# Patient Record
Sex: Male | Born: 1935 | Race: White | Hispanic: No | Marital: Married | Smoking: Former smoker
Health system: Southern US, Community
[De-identification: ages and names within clinical notes are randomized; demographics above are authoritative.]

## PROBLEM LIST (undated history)

## (undated) DIAGNOSIS — I7409 Other arterial embolism and thrombosis of abdominal aorta: Secondary | ICD-10-CM

## (undated) DIAGNOSIS — I10 Essential (primary) hypertension: Secondary | ICD-10-CM

## (undated) DIAGNOSIS — I2510 Atherosclerotic heart disease of native coronary artery without angina pectoris: Secondary | ICD-10-CM

## (undated) DIAGNOSIS — I739 Peripheral vascular disease, unspecified: Secondary | ICD-10-CM

## (undated) DIAGNOSIS — Z951 Presence of aortocoronary bypass graft: Secondary | ICD-10-CM

## (undated) DIAGNOSIS — Z720 Tobacco use: Secondary | ICD-10-CM

## (undated) DIAGNOSIS — I771 Stricture of artery: Secondary | ICD-10-CM

## (undated) DIAGNOSIS — G453 Amaurosis fugax: Secondary | ICD-10-CM

## (undated) DIAGNOSIS — I4510 Unspecified right bundle-branch block: Secondary | ICD-10-CM

## (undated) DIAGNOSIS — E785 Hyperlipidemia, unspecified: Secondary | ICD-10-CM

## (undated) DIAGNOSIS — Z9889 Other specified postprocedural states: Secondary | ICD-10-CM

## (undated) DIAGNOSIS — Z950 Presence of cardiac pacemaker: Secondary | ICD-10-CM

## (undated) DIAGNOSIS — I442 Atrioventricular block, complete: Secondary | ICD-10-CM

## (undated) DIAGNOSIS — I350 Nonrheumatic aortic (valve) stenosis: Secondary | ICD-10-CM

## (undated) DIAGNOSIS — I219 Acute myocardial infarction, unspecified: Secondary | ICD-10-CM

## (undated) DIAGNOSIS — J449 Chronic obstructive pulmonary disease, unspecified: Secondary | ICD-10-CM

## (undated) DIAGNOSIS — I679 Cerebrovascular disease, unspecified: Secondary | ICD-10-CM

## (undated) HISTORY — DX: Tobacco use: Z72.0

## (undated) HISTORY — PX: CAROTID ENDARTERECTOMY: SUR193

## (undated) HISTORY — PX: EYE SURGERY: SHX253

## (undated) HISTORY — DX: Atherosclerotic heart disease of native coronary artery without angina pectoris: I25.10

## (undated) HISTORY — DX: Unspecified right bundle-branch block: I45.10

## (undated) HISTORY — DX: Acute myocardial infarction, unspecified: I21.9

## (undated) HISTORY — DX: Cerebrovascular disease, unspecified: I67.9

## (undated) HISTORY — DX: Other specified postprocedural states: Z98.890

## (undated) HISTORY — DX: Stricture of artery: I77.1

## (undated) HISTORY — DX: Hyperlipidemia, unspecified: E78.5

## (undated) HISTORY — DX: Nonrheumatic aortic (valve) stenosis: I35.0

## (undated) HISTORY — DX: Atrioventricular block, complete: I44.2

## (undated) HISTORY — DX: Essential (primary) hypertension: I10

## (undated) HISTORY — PX: CORONARY ARTERY BYPASS GRAFT: SHX141

## (undated) HISTORY — DX: Peripheral vascular disease, unspecified: I73.9

## (undated) HISTORY — DX: Presence of aortocoronary bypass graft: Z95.1

## (undated) HISTORY — DX: Amaurosis fugax: G45.3

## (undated) HISTORY — DX: Other arterial embolism and thrombosis of abdominal aorta: I74.09

## (undated) HISTORY — PX: CARDIAC VALVE REPLACEMENT: SHX585

---

## 1994-03-22 DIAGNOSIS — I219 Acute myocardial infarction, unspecified: Secondary | ICD-10-CM

## 1994-03-22 HISTORY — DX: Acute myocardial infarction, unspecified: I21.9

## 1994-03-28 DIAGNOSIS — Z951 Presence of aortocoronary bypass graft: Secondary | ICD-10-CM

## 1994-03-28 HISTORY — DX: Presence of aortocoronary bypass graft: Z95.1

## 1996-10-11 DIAGNOSIS — G453 Amaurosis fugax: Secondary | ICD-10-CM

## 1996-10-11 HISTORY — DX: Amaurosis fugax: G45.3

## 1996-10-28 DIAGNOSIS — Z9889 Other specified postprocedural states: Secondary | ICD-10-CM | POA: Insufficient documentation

## 1996-10-28 HISTORY — DX: Other specified postprocedural states: Z98.890

## 2001-01-26 ENCOUNTER — Encounter: Payer: Self-pay | Admitting: Family Medicine

## 2001-01-26 ENCOUNTER — Ambulatory Visit (HOSPITAL_COMMUNITY): Admission: RE | Admit: 2001-01-26 | Discharge: 2001-01-26 | Payer: Self-pay | Admitting: Family Medicine

## 2003-05-15 ENCOUNTER — Encounter: Admission: RE | Admit: 2003-05-15 | Discharge: 2003-05-15 | Payer: Self-pay | Admitting: Internal Medicine

## 2005-12-17 ENCOUNTER — Ambulatory Visit: Payer: Self-pay | Admitting: Internal Medicine

## 2009-06-13 ENCOUNTER — Encounter: Payer: Self-pay | Admitting: Internal Medicine

## 2009-07-10 ENCOUNTER — Encounter: Payer: Self-pay | Admitting: Internal Medicine

## 2009-07-10 HISTORY — PX: CARDIOVASCULAR STRESS TEST: SHX262

## 2009-07-19 ENCOUNTER — Encounter: Payer: Self-pay | Admitting: Internal Medicine

## 2009-12-11 ENCOUNTER — Encounter: Payer: Self-pay | Admitting: Internal Medicine

## 2010-01-10 ENCOUNTER — Encounter: Payer: Self-pay | Admitting: Internal Medicine

## 2010-03-14 NOTE — Letter (Signed)
Summary: [facility]  [facility]   Imported By: Lanelle Bal 07/28/2009 11:27:55  _____________________________________________________________________  External Attachment:    Type:   Image     Comment:   External Document

## 2010-03-14 NOTE — Letter (Signed)
Summary: [facility]  [facility]   Imported By: Lanelle Bal 12/27/2009 13:24:55  _____________________________________________________________________  External Attachment:    Type:   Image     Comment:   External Document

## 2010-03-14 NOTE — Letter (Signed)
Summary: [facility]  [facility]   Imported By: Lanelle Bal 06/25/2009 13:23:40  _____________________________________________________________________  External Attachment:    Type:   Image     Comment:   External Document

## 2010-10-22 ENCOUNTER — Other Ambulatory Visit: Payer: Self-pay

## 2010-10-22 MED ORDER — ZOSTER VACCINE LIVE 19400 UNT/0.65ML ~~LOC~~ SOLR: 0.6500 mL | Freq: Once | SUBCUTANEOUS | Status: DC

## 2010-10-22 NOTE — Telephone Encounter (Signed)
Rx sent to pharmacy per Patient's wife request

## 2011-04-30 DIAGNOSIS — H2510 Age-related nuclear cataract, unspecified eye: Secondary | ICD-10-CM | POA: Diagnosis not present

## 2011-04-30 DIAGNOSIS — H25019 Cortical age-related cataract, unspecified eye: Secondary | ICD-10-CM | POA: Diagnosis not present

## 2011-05-27 DIAGNOSIS — E782 Mixed hyperlipidemia: Secondary | ICD-10-CM | POA: Diagnosis not present

## 2011-05-27 DIAGNOSIS — Z79899 Other long term (current) drug therapy: Secondary | ICD-10-CM | POA: Diagnosis not present

## 2011-05-27 DIAGNOSIS — I2510 Atherosclerotic heart disease of native coronary artery without angina pectoris: Secondary | ICD-10-CM | POA: Diagnosis not present

## 2011-06-02 DIAGNOSIS — I4510 Unspecified right bundle-branch block: Secondary | ICD-10-CM | POA: Diagnosis not present

## 2011-06-02 DIAGNOSIS — I359 Nonrheumatic aortic valve disorder, unspecified: Secondary | ICD-10-CM | POA: Diagnosis not present

## 2011-06-02 DIAGNOSIS — Z951 Presence of aortocoronary bypass graft: Secondary | ICD-10-CM | POA: Diagnosis not present

## 2011-06-02 DIAGNOSIS — I2510 Atherosclerotic heart disease of native coronary artery without angina pectoris: Secondary | ICD-10-CM | POA: Diagnosis not present

## 2011-06-10 DIAGNOSIS — I2510 Atherosclerotic heart disease of native coronary artery without angina pectoris: Secondary | ICD-10-CM | POA: Diagnosis not present

## 2011-06-10 DIAGNOSIS — I359 Nonrheumatic aortic valve disorder, unspecified: Secondary | ICD-10-CM | POA: Diagnosis not present

## 2011-06-19 ENCOUNTER — Other Ambulatory Visit: Payer: Self-pay | Admitting: Cardiovascular Disease

## 2011-06-19 DIAGNOSIS — I359 Nonrheumatic aortic valve disorder, unspecified: Secondary | ICD-10-CM | POA: Diagnosis not present

## 2011-06-19 DIAGNOSIS — I2510 Atherosclerotic heart disease of native coronary artery without angina pectoris: Secondary | ICD-10-CM | POA: Diagnosis not present

## 2011-06-19 DIAGNOSIS — R5381 Other malaise: Secondary | ICD-10-CM | POA: Diagnosis not present

## 2011-06-19 DIAGNOSIS — Z951 Presence of aortocoronary bypass graft: Secondary | ICD-10-CM | POA: Diagnosis not present

## 2011-06-19 DIAGNOSIS — D689 Coagulation defect, unspecified: Secondary | ICD-10-CM | POA: Diagnosis not present

## 2011-06-19 DIAGNOSIS — Z79899 Other long term (current) drug therapy: Secondary | ICD-10-CM | POA: Diagnosis not present

## 2011-06-19 DIAGNOSIS — R6889 Other general symptoms and signs: Secondary | ICD-10-CM | POA: Diagnosis not present

## 2011-06-19 DIAGNOSIS — Z01818 Encounter for other preprocedural examination: Secondary | ICD-10-CM | POA: Diagnosis not present

## 2011-06-23 ENCOUNTER — Encounter (HOSPITAL_COMMUNITY): Payer: Self-pay

## 2011-06-24 ENCOUNTER — Ambulatory Visit (HOSPITAL_COMMUNITY): Payer: Medicare Other

## 2011-06-24 ENCOUNTER — Ambulatory Visit (HOSPITAL_COMMUNITY)
Admission: RE | Admit: 2011-06-24 | Discharge: 2011-06-24 | Disposition: A | Discharge status: Home / Self Care | Payer: Medicare Other | Source: Ambulatory Visit | Attending: Cardiovascular Disease | Admitting: Cardiovascular Disease

## 2011-06-24 ENCOUNTER — Encounter (HOSPITAL_COMMUNITY)
Admission: RE | Disposition: A | Discharge status: Home / Self Care | Payer: Self-pay | Source: Ambulatory Visit | Attending: Cardiovascular Disease

## 2011-06-24 DIAGNOSIS — I2510 Atherosclerotic heart disease of native coronary artery without angina pectoris: Secondary | ICD-10-CM | POA: Insufficient documentation

## 2011-06-24 DIAGNOSIS — Z951 Presence of aortocoronary bypass graft: Secondary | ICD-10-CM | POA: Diagnosis not present

## 2011-06-24 DIAGNOSIS — I2582 Chronic total occlusion of coronary artery: Secondary | ICD-10-CM | POA: Diagnosis not present

## 2011-06-24 DIAGNOSIS — I359 Nonrheumatic aortic valve disorder, unspecified: Secondary | ICD-10-CM | POA: Diagnosis not present

## 2011-06-24 DIAGNOSIS — R918 Other nonspecific abnormal finding of lung field: Secondary | ICD-10-CM | POA: Diagnosis not present

## 2011-06-24 DIAGNOSIS — I25810 Atherosclerosis of coronary artery bypass graft(s) without angina pectoris: Secondary | ICD-10-CM | POA: Diagnosis not present

## 2011-06-24 HISTORY — PX: LEFT HEART CATHETERIZATION WITH CORONARY/GRAFT ANGIOGRAM: SHX5450

## 2011-06-24 HISTORY — PX: CARDIAC CATHETERIZATION: SHX172

## 2011-06-24 LAB — POCT I-STAT 3, ART BLOOD GAS (G3+)
O2 Saturation: 93 %
pCO2 arterial: 41.7 mmHg (ref 35.0–45.0)
pH, Arterial: 7.353 (ref 7.350–7.450)
pO2, Arterial: 69 mmHg — ABNORMAL LOW (ref 80.0–100.0)

## 2011-06-24 LAB — POCT I-STAT 3, VENOUS BLOOD GAS (G3P V)
Bicarbonate: 25.4 mEq/L — ABNORMAL HIGH (ref 20.0–24.0)
Bicarbonate: 26.1 mEq/L — ABNORMAL HIGH (ref 20.0–24.0)
O2 Saturation: 63 %
TCO2: 28 mmol/L (ref 0–100)
pCO2, Ven: 48.5 mmHg (ref 45.0–50.0)
pH, Ven: 7.34 — ABNORMAL HIGH (ref 7.250–7.300)
pO2, Ven: 35 mmHg (ref 30.0–45.0)

## 2011-06-24 SURGERY — LEFT HEART CATHETERIZATION WITH CORONARY/GRAFT ANGIOGRAM
Anesthesia: LOCAL

## 2011-06-24 MED ORDER — SODIUM CHLORIDE 0.9 % IJ SOLN: 3.0000 mL | Freq: Two times a day (BID) | INTRAMUSCULAR | Status: DC

## 2011-06-24 MED ORDER — ONDANSETRON HCL 4 MG/2ML IJ SOLN: 4.0000 mg | Freq: Four times a day (QID) | INTRAMUSCULAR | Status: DC | PRN

## 2011-06-24 MED ORDER — SODIUM CHLORIDE 0.9 % IV SOLN: 250.0000 mL | INTRAVENOUS | Status: DC | PRN

## 2011-06-24 MED ORDER — ACETAMINOPHEN 325 MG PO TABS: 650.0000 mg | ORAL_TABLET | ORAL | Status: DC | PRN

## 2011-06-24 MED ORDER — HEPARIN (PORCINE) IN NACL 2-0.9 UNIT/ML-% IJ SOLN
INTRAMUSCULAR | Status: AC
  Filled 2011-06-24: qty 2000

## 2011-06-24 MED ORDER — SODIUM CHLORIDE 0.9 % IJ SOLN: 3.0000 mL | INTRAMUSCULAR | Status: DC | PRN

## 2011-06-24 MED ORDER — SODIUM CHLORIDE 0.9 % IV SOLN
INTRAVENOUS | Status: DC
  Administered 2011-06-24: 1000 mL via INTRAVENOUS

## 2011-06-24 MED ORDER — SODIUM CHLORIDE 0.9 % IV SOLN: 1.0000 mL/kg/h | INTRAVENOUS | Status: DC

## 2011-06-24 MED ORDER — ASPIRIN 81 MG PO CHEW
324.0000 mg | CHEWABLE_TABLET | ORAL | Status: AC
  Administered 2011-06-24: 324 mg via ORAL
  Filled 2011-06-24: qty 4

## 2011-06-24 MED ORDER — NITROGLYCERIN 0.2 MG/ML ON CALL CATH LAB
INTRAVENOUS | Status: AC
  Filled 2011-06-24: qty 1

## 2011-06-24 MED ORDER — LIDOCAINE HCL (PF) 1 % IJ SOLN
INTRAMUSCULAR | Status: AC
  Filled 2011-06-24: qty 30

## 2011-06-24 NOTE — Discharge Instructions (Signed)
Appt with Dr. Cornelius Moras on Monday 5/20 at 9:00 am   Groin Site Care Refer to this sheet in the next few weeks. These instructions provide you with information on caring for yourself after your procedure. Your caregiver may also give you more specific instructions. Your treatment has been planned according to current medical practices, but problems sometimes occur. Call your caregiver if you have any problems or questions after your procedure. HOME CARE INSTRUCTIONS  You may shower 24 hours after the procedure. Remove the bandage (dressing) and gently wash the site with plain soap and water. Gently pat the site dry.   Do not apply powder or lotion to the site.   Do not sit in a bathtub, swimming pool, or whirlpool for 5 to 7 days.   No bending, squatting, or lifting anything over 10 pounds (4.5 kg) as directed by your caregiver.   Inspect the site at least twice daily.   Do not drive home if you are discharged the same day of the procedure. Have someone else drive you.   You may drive 24 hours after the procedure unless otherwise instructed by your caregiver.  What to expect:  Any bruising will usually fade within 1 to 2 weeks.   Blood that collects in the tissue (hematoma) may be painful to the touch. It should usually decrease in size and tenderness within 1 to 2 weeks.  SEEK IMMEDIATE MEDICAL CARE IF:  You have unusual pain at the groin site or down the affected leg.   You have redness, warmth, swelling, or pain at the groin site.   You have drainage (other than a small amount of blood on the dressing).   You have chills.   You have a fever or persistent symptoms for more than 72 hours.   You have a fever and your symptoms suddenly get worse.   Your leg becomes pale, cool, tingly, or numb.   You have heavy bleeding from the site. Hold pressure on the site.  Document Released: 03/01/2010 Document Revised: 01/16/2011 Document Reviewed: 03/01/2010 [facility] Patient Information  2012 [place], [place].

## 2011-06-24 NOTE — CV Procedure (Signed)
Luis Townsend,Luis Townsend Male, 78 y.o., November 24, 1928  Location: [facility]   CARDIAC CATHETERIZATION REPORT   Procedures performed:  1. Right and left heart catheterization  2. Selective coronary angiography  3. Selective angiography of saphenous vein graft bypasses 4. Selective angiography of left subclavian artery ; subselective angiography of left internal mammary artery bypass to LAD 5.  Selective angiography of bilateral common iliac arteries   Reason for procedure:  Severe aortic stenosis Coronary artery disease status post previous coronary bypass surgery  Procedure performed by: Thurmon Fair, MD, [facility]  Complications: none   Estimated blood loss: less than 5 mL   History:  This is an 78 year old gentleman who is now roughly 18 years status post coronary bypass surgery who has also had carotid endarterectomy and is being evaluated for severe aortic stenosis. There is a noticeable decline in activity level despite the fact that he does not endorsed symptoms of exertional dyspnea angina or syncope.  Consent: The risks, benefits, and details of the procedure were explained to the patient. Risks including death, MI, stroke, bleeding, limb ischemia, renal failure and allergy were described and accepted by the patient. Informed written consent was obtained prior to proceeding.  Technique: The patient was brought to the cardiac catheterization laboratory in the fasting state. He was prepped and draped in the usual sterile fashion. Local anesthesia with 1% lidocaine was administered to the right groin area. Using the modified Seldinger technique a 5 French right common femoral artery sheath was introduced without difficulty. Although the treated guidewire passed through the iliac artery without difficulty the JL4 catheter required some manipulation due to the extensive ossification and luminal irregularities in the right common iliac artery. Under fluoroscopic guidance, using 5 [place] JL4, JR   selective cannulation of the left coronary artery, right coronary artery and left ventricle were respectively performed. A straight tipped guidewires was used to cross the aortic valve (with moderate difficulty). The JR catheter was used to record left ventricular pressure and a pullback to the aorta. The same JR catheter was used to perform selective angiography of the saphenous vein graft bypasses and was then used to cannulate the left subclavian artery. The JR catheter would not cross a severe stenosis in the proximal left subclavian artery just upstream of the left vertebral and left intramammary artery ostia. A Wholey wire was successfully passed across the stenosis but the catheter would not follow.   Several angiograms of the native coronary arteries and a saphenous vein graft bypasses, in a variety of projections, were recorded. Subselective angiography of the LIMA bypass was also performed.   Subsequently right heart catheterization was performed using a 7 [place] balloontipped pulmonary artery catheter. This was passed under fluoroscopic guidance to the right heart chambers to the pulmonary artery and wedge position. The cath was also used to obtain samples of blood for oxygen saturation from the main pulmonary artery and right atrium all a simultaneous sample of blood for systemic oxygen saturation was obtained via the femoral artery sheath.  At the end of the procedure, all catheters were removed. After the procedure, hemostasis will be achieved with manual pressure.  Contrast used: 125 mL Omnipaque  Hemodynamic findings:  Aortic pressure 157/63 mm Hg (mean 96 mm Hg)  Left ventricle 203/9 mm Hg with end-diastolic pressure of 25 mm Hg due to a prominent "a kick"  Pulmonary artery wedge pressure A wave 17 V-wave 14 mean 12 mm Hg  Pulmonary artery 31/12 mm Hg ( mean 19  mm Hg)  Right ventricle 34/4 mm Hg with an end-diastolic pressure of 8 mm Hg  Right atrium A wave 11 V wave 8 mean 7  mm Hg  Cardiac output is 3.98 L per minute (cardiac index 2.19 L per minute per meter sq) using the Fick equation  Dear valve mean gradient was 65 mmHg the peak to peak gradient was 83 mmHg, aortic valve area using the Gorlin equation was 0.54 cm (0.37cm/ m body surface area)   Angiographic Findings:  1. The left main coronary artery is heavily diseased and calcified with a 90% ostial eccentric stenosis and continues exclusively with the left circumflex coronary artery.  2. The left anterior descending artery is totally occluded at the ostium. The mid and distal potion of the LAD fill from the LIMA bypass. The LAD is a large vessel that reaches the apex and generates one major diagonal branch. There is evidence of extensive and diffuse luminal irregularities and moderate calcification. No discrete hemodynamically meaningful stenoses are seen. 3. The left circumflex coronary artery is a large-size vessel non dominant vessel that reportedly used to generate three major oblique marginal arteries. Only the AV groove portion of the circumflex artery is patent and it has a 80% eccentric proximal to mid stenosis. It feeds two small and very distal posterolateral ventricular branches. 4. The right coronary artery is a medium-size dominant vessel that is totally occluded following the sinoatrial branch. 5. The saphenous vein bypass graft to the posterior descending artery is severely diseased in its distal third, with a maximum stenosis of 85-90%. The PDA is diffusely diseased and relatively small. 6. The saphenous vein bypass graft to the OM1-OM2-OM3 is patent but only one of the OM arteries is patent (probably OM2). Despite being diffusely diseased, this graft provides good flow. The OM1 is clearly occluded and heavily calcified. 7. The left internal mammary artery bypass to the LAD is healthy and widely patent, but is downstream of a severe (>85%) left subclavian artery stenosis (located roughly 1 cm  proximal to the left vertebral artery ostium), which was crossed with a guidewire, but could not be crossed with a catheter. 8. The left ventricle was not injected, but the aortic valve was crossed for pressure measurement. There is critical aortic valve stenosis by pullback. The valve is heavily calcified and has severely restricted motion by fluoroscopy.The left ventricular end-diastolic pressure is elevated, primarily due to a prominent "a kick".  9. The iliac arteries and the abdominal aorta are heavily calcified and rigid and there was mild difficulty advancing diagnostic catheters up the iliac artery. No severe stenoses are seen with the limited evaluation possible at the time of diagnostic cardiac cath. However, the vessels appear too diseased to allow consideration of TAVR or even IABP support.   IMPRESSIONS:  Critical aortic stenosis. Elevated LVEDP without increase in left atrial mean pressure or PA pressure at rest. Severe native and graft coronary artery disease. RECOMMENDATION:  Percutaneous angioplasty and stenting of the left subclavian artery. High risk replacement of the aortic valve with a biological prosthesis and redo CABG with preservation of the LIMA bypass and new SVG to PDA and OM. Reevaluate carotid arteries and pulmonary function prior to surgery.     Thurmon Fair, MD, [facility] [phone] office [phone] pager 06/24/2011 11:01 AM

## 2011-06-24 NOTE — H&P (Signed)
Date of Initial H&P: Dr. Susa Griffins, 06/18/11  History reviewed, patient examined, no change in status, stable for surgery. Hx of remote CABG now with echo and clinical findings of severe AS and questionable functional status. Risks and benefits of R&L heart cath reviewed and he wishes to proceed. Thurmon Fair, MD, [facility] [phone] office [phone] pager 06/24/2011 8:39 AM

## 2011-06-25 DIAGNOSIS — I739 Peripheral vascular disease, unspecified: Secondary | ICD-10-CM | POA: Diagnosis not present

## 2011-06-25 DIAGNOSIS — I359 Nonrheumatic aortic valve disorder, unspecified: Secondary | ICD-10-CM | POA: Diagnosis not present

## 2011-06-25 DIAGNOSIS — I2510 Atherosclerotic heart disease of native coronary artery without angina pectoris: Secondary | ICD-10-CM | POA: Diagnosis not present

## 2011-06-30 ENCOUNTER — Institutional Professional Consult (permissible substitution) (INDEPENDENT_AMBULATORY_CARE_PROVIDER_SITE_OTHER): Payer: Medicare Other | Admitting: Thoracic Surgery (Cardiothoracic Vascular Surgery)

## 2011-06-30 ENCOUNTER — Encounter: Payer: Self-pay | Admitting: Thoracic Surgery (Cardiothoracic Vascular Surgery)

## 2011-06-30 VITALS — BP 145/64 | HR 58 | Resp 16 | Ht 66.0 in | Wt 160.0 lb

## 2011-06-30 DIAGNOSIS — I771 Stricture of artery: Secondary | ICD-10-CM

## 2011-06-30 DIAGNOSIS — Z951 Presence of aortocoronary bypass graft: Secondary | ICD-10-CM | POA: Diagnosis not present

## 2011-06-30 DIAGNOSIS — I679 Cerebrovascular disease, unspecified: Secondary | ICD-10-CM | POA: Insufficient documentation

## 2011-06-30 DIAGNOSIS — I10 Essential (primary) hypertension: Secondary | ICD-10-CM | POA: Insufficient documentation

## 2011-06-30 DIAGNOSIS — I7409 Other arterial embolism and thrombosis of abdominal aorta: Secondary | ICD-10-CM

## 2011-06-30 DIAGNOSIS — I350 Nonrheumatic aortic (valve) stenosis: Secondary | ICD-10-CM

## 2011-06-30 DIAGNOSIS — I359 Nonrheumatic aortic valve disorder, unspecified: Secondary | ICD-10-CM

## 2011-06-30 DIAGNOSIS — I2510 Atherosclerotic heart disease of native coronary artery without angina pectoris: Secondary | ICD-10-CM | POA: Diagnosis not present

## 2011-06-30 HISTORY — DX: Other arterial embolism and thrombosis of abdominal aorta: I74.09

## 2011-06-30 HISTORY — DX: Cerebrovascular disease, unspecified: I67.9

## 2011-06-30 HISTORY — DX: Stricture of artery: I77.1

## 2011-06-30 HISTORY — DX: Nonrheumatic aortic (valve) stenosis: I35.0

## 2011-06-30 NOTE — Progress Notes (Signed)
[address]            [place] [address]          [phone]     CARDIOTHORACIC SURGERY CONSULTATION REPORT  Referring Provider is Susa Griffins, MD PCP is Marga Melnick, MD, MD  Chief Complaint  Patient presents with  . Coronary Artery Disease    eval and treat...cathed 06/24/11...echo 06/10/11  . Aortic Stenosis  . 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  The patient originally suffered an acute anterior wall myocardial infarction in February 1996. He underwent angioplasty followed by coronary artery bypass grafting x5 by Dr. Andrey Campanile. Patient has done extremely well from a cardiovascular standpoint since then. He developed a murmur on exam and was diagnosed with aortic stenosis several years ago. He has been followed carefully by Dr. Alanda Amass since then. The patient describes gradual progression of exertional fatigue with perhaps mild exertional shortness of breath. He is quite active physically for his age, but he has noted that he seems to be slowing down over the last several months or more. He has not had any chest pain chest tightness nor chest pressure either with activity or at rest. He does not get short of breath with normal physical activity. He has had no dizzy spells nor syncope. He denies PND, orthopnea, or lower extremity edema. Recent echocardiogram performed 06/10/2011 demonstrated significant progression of the severity of aortic stenosis. The peak velocity across the aortic valve measured 5.1 m/sec corresponding to peak transvalvular gradient estimated  70 mm mercury with valve area estimated between 0.59 and 0.62 cm. Left ventricular function reportedly remains preserved with ejection fraction estimated greater than equal to 55%.  He subsequently underwent elective left and right heart  catheterization on 06/24/2011. This confirmed the presence of severe aortic stenosis with Peak to peak and mean transvalvular gradients measured across the aortic valve of 83 and 65 mm mercury respectively. Aortic valve area was estimated 0.54 cm. Cardiac output and pulmonary artery pressures were essentially normal. Cardiac catheterization was notable for the presence of severe native three-vessel coronary artery disease with essentially complete occlusion of all major native coronary arteries. Previous left internal mammary artery graft was widely patent to the left anterior descending coronary artery. Vein graft to the circumflex system was patent although 2 of the 3 distal vessels in the circumflex system were chronically occluded. Vein graft to the right coronary artery was patent but severely diseased. The patient has been referred to consider possible treatment options.  Past Medical History  Diagnosis Date  . Myocardial infarction 03/22/1994    acute LAD occlusion  . Hypertension   . Hyperlipidemia   . S/P CABG x 5 03/28/1994    LIMA to LAD, sequential SVG to OM1-OM2-OM4, SVG to RCA, open vein harvest right leg - Dr Andrey Campanile  . Cerebrovascular disease 06/30/2011  . S/P carotid endarterectomy 10/28/1996    Dr Arbie Cookey  . Subclavian artery stenosis, left 06/30/2011  . Aortoiliac occlusive disease 06/30/2011  . Aortic stenosis 06/30/2011    severe  . Amaurosis fugax of left eye 10/11/1996  . Tobacco abuse     Past Surgical History  Procedure Date  . Carotid endarterectomy 10/28/96  DR. TODD EARLY    LEFT AND DACRON PATCH ANGIOPLASTY  .  Coronary artery bypass graft 03/28/94 DR. CHARLES WILSON    x5(LIMA to LAD,SVG to OM1-OM2-OM4, SVG to RCA    Family History  Problem Relation Age of Onset  . Stroke Mother     Social History History  Substance Use Topics  . Smoking status: Former Smoker    Quit date: 03/02/2011  . Smokeless tobacco: Never Used  . Alcohol Use: No    Current Outpatient  Prescriptions  Medication Sig Dispense Refill  . aspirin EC 81 MG tablet Take 81 mg by mouth daily.      . cholecalciferol (VITAMIN D) 1000 UNITS tablet Take 1,000 Units by mouth daily.      . fish oil-omega-3 fatty acids 1000 MG capsule Take 1 g by mouth daily.      . ramipril (ALTACE) 10 MG capsule Take 20 mg by mouth daily.       . rosuvastatin (CRESTOR) 20 MG tablet Take 40 mg by mouth every evening.       . zoster vaccine live, PF, (ZOSTAVAX) 16109 UNT/0.65ML injection Inject 19,400 Units into the skin once.  1 vial  0    No Known Allergies  Review of Systems:  General:  Decreased appetite, approx 10 lb weight loss, decreased energy   Respiratory:  + occasional cough, no wheezing, no hemoptysis, no pain with inspiration or cough, no shortness of breath  Cardiac:   no chest pain or tightness, + mild exertional SOB but only with significant exertion, no resting SOB, no PND, no orthopnea, no LE edema, no palpitations, no syncope  GI:   no difficulty swallowing, no hematochezia, no hematemesis, no melena, no constipation, no diarrhea   GU:   no dysuria, no urgency, no frequency   Musculoskeletal: no arthritis, no arthralgia   Vascular:  no pain suggestive of claudication   Neuro:   no symptoms suggestive of TIA's, no seizures, no headaches, no peripheral neuropathy   Endocrine:  Negative   HEENT:  no loose teeth or painful teeth,  no recent vision changes  Psych:   no anxiety, no depression    Physical Exam:   BP 145/64  Pulse 58  Resp 16  Ht 5\' 6"  (1.676 m)  Wt 160 lb (72.576 kg)  BMI 25.82 kg/m2  SpO2 96%  General:  well-appearing, looks younger than stated age  HEENT:  Unremarkable   Neck:   no JVD, + bilateral carotid bruits, old scar left neck, no adenopathy   Chest:   clear to auscultation, symmetrical breath sounds, no wheezes, no rhonchi, well-healed sternotomy scar with stable sternum  CV:   RRR, II/VI soft systolic murmur best RUSB  Abdomen:  soft, non-tender, no  masses   Extremities:  warm, well-perfused, pulses diminished in groin - not palpable at ankle, no LE edema, old scar right leg from just above the knee to ankle  Rectal/GU  Deferred  Neuro:   Grossly non-focal and symmetrical throughout  Skin:   Clean and dry, no rashes, no breakdown   Diagnostic Tests:  Report of 2-D echocardiogram performed 06/10/2011 [facility] is reviewed. By report left ventricular was normal in size with mild concentric left ventricular hypertrophy. Left ventricular systolic function was reportedly normal with ejection fraction greater than or equal to 55%. There was severe aortic stenosis with moderate calcification of the aortic valve leaflets and trace aortic regurgitation. The peak velocity across the aortic valve measured 514 cm/s corresponding to a mean gradient of 70 mm mercury and valve  area estimated between 0.5 9 and 0.62 cm. Aortic root diameter was estimated 3.0 cm with LV outflow tract diameter 2.0 cm. There was mild to moderate dilatation of the left atrium with mild mitral annular calcification and only trace mitral regurgitation. No other significant abnormalities were noted.    CARDIAC CATHETERIZATION REPORT   Procedures performed:  1. Right and left heart catheterization  2. Selective coronary angiography  3. Selective angiography of saphenous vein graft bypasses  4. Selective angiography of left subclavian artery ; subselective angiography of left internal mammary artery bypass to LAD  5. Selective angiography of bilateral common iliac arteries  Reason for procedure:  Severe aortic stenosis  Coronary artery disease status post previous coronary bypass surgery  Procedure performed by: Thurmon Fair, MD, [facility]  Complications: none  Estimated blood loss: less than 5 mL  History: This is an 78 year old gentleman who is now roughly 18 years status post coronary bypass surgery who has also had carotid endarterectomy and is  being evaluated for severe aortic stenosis. There is a noticeable decline in activity level despite the fact that he does not endorsed symptoms of exertional dyspnea angina or syncope.  Consent: The risks, benefits, and details of the procedure were explained to the patient. Risks including death, MI, stroke, bleeding, limb ischemia, renal failure and allergy were described and accepted by the patient. Informed written consent was obtained prior to proceeding.  Technique: The patient was brought to the cardiac catheterization laboratory in the fasting state. He was prepped and draped in the usual sterile fashion. Local anesthesia with 1% lidocaine was administered to the right groin area. Using the modified Seldinger technique a 5 French right common femoral artery sheath was introduced without difficulty. Although the treated guidewire passed through the iliac artery without difficulty the JL4 catheter required some manipulation due to the extensive ossification and luminal irregularities in the right common iliac artery. Under fluoroscopic guidance, using 5 [place] JL4, JR selective cannulation of the left coronary artery, right coronary artery and left ventricle were respectively performed. A straight tipped guidewires was used to cross the aortic valve (with moderate difficulty). The JR catheter was used to record left ventricular pressure and a pullback to the aorta. The same JR catheter was used to perform selective angiography of the saphenous vein graft bypasses and was then used to cannulate the left subclavian artery. The JR catheter would not cross a severe stenosis in the proximal left subclavian artery just upstream of the left vertebral and left intramammary artery ostia. A Wholey wire was successfully passed across the stenosis but the catheter would not follow.  Several angiograms of the native coronary arteries and a saphenous vein graft bypasses, in a variety of projections, were recorded.  Subselective angiography of the LIMA bypass was also performed.  Subsequently right heart catheterization was performed using a 7 [place] balloontipped pulmonary artery catheter. This was passed under fluoroscopic guidance to the right heart chambers to the pulmonary artery and wedge position. The cath was also used to obtain samples of blood for oxygen saturation from the main pulmonary artery and right atrium all a simultaneous sample of blood for systemic oxygen saturation was obtained via the femoral artery sheath.  At the end of the procedure, all catheters were removed. After the procedure, hemostasis will be achieved with manual pressure.  Contrast used: 125 mL Omnipaque  Hemodynamic findings:  Aortic pressure 157/63 mm Hg (mean 96 mm Hg)  Left ventricle 203/9 mm Hg with end-diastolic pressure of  25 mm Hg due to a prominent "a kick"  Pulmonary artery wedge pressure A wave 17 V-wave 14 mean 12 mm Hg  Pulmonary artery 31/12 mm Hg ( mean 19 mm Hg)  Right ventricle 34/4 mm Hg with an end-diastolic pressure of 8 mm Hg  Right atrium A wave 11 V wave 8 mean 7 mm Hg  Cardiac output is 3.98 L per minute (cardiac index 2.19 L per minute per meter sq) using the Fick equation  Dear valve mean gradient was 65 mmHg the peak to peak gradient was 83 mmHg, aortic valve area using the Gorlin equation was 0.54 cm (0.37cm/ m body surface area)  Angiographic Findings:  1. The left main coronary artery is heavily diseased and calcified with a 90% ostial eccentric stenosis and continues exclusively with the left circumflex coronary artery.  2. The left anterior descending artery is totally occluded at the ostium. The mid and distal potion of the LAD fill from the LIMA bypass. The LAD is a large vessel that reaches the apex and generates one major diagonal branch. There is evidence of extensive and diffuse luminal irregularities and moderate calcification. No discrete hemodynamically meaningful stenoses are seen.    3. The left circumflex coronary artery is a large-size vessel non dominant vessel that reportedly used to generate three major oblique marginal arteries. Only the AV groove portion of the circumflex artery is patent and it has a 80% eccentric proximal to mid stenosis. It feeds two small and very distal posterolateral ventricular branches.  4. The right coronary artery is a medium-size dominant vessel that is totally occluded following the sinoatrial branch.  5. The saphenous vein bypass graft to the posterior descending artery is severely diseased in its distal third, with a maximum stenosis of 85-90%. The PDA is diffusely diseased and relatively small.  6. The saphenous vein bypass graft to the OM1-OM2-OM3 is patent but only one of the OM arteries is patent (probably OM2). Despite being diffusely diseased, this graft provides good flow. The OM1 is clearly occluded and heavily calcified.  7. The left internal mammary artery bypass to the LAD is healthy and widely patent, but is downstream of a severe (>85%) left subclavian artery stenosis (located roughly 1 cm proximal to the left vertebral artery ostium), which was crossed with a guidewire, but could not be crossed with a catheter.  8. The left ventricle was not injected, but the aortic valve was crossed for pressure measurement. There is critical aortic valve stenosis by pullback. The valve is heavily calcified and has severely restricted motion by fluoroscopy.The left ventricular end-diastolic pressure is elevated, primarily due to a prominent "a kick".  9. The iliac arteries and the abdominal aorta are heavily calcified and rigid and there was mild difficulty advancing diagnostic catheters up the iliac artery. No severe stenoses are seen with the limited evaluation possible at the time of diagnostic cardiac cath. However, the vessels appear too diseased to allow consideration of TAVR or even IABP support.   IMPRESSIONS:  Critical aortic stenosis.   Elevated LVEDP without increase in left atrial mean pressure or PA pressure at rest.  Severe native and graft coronary artery disease.  RECOMMENDATION:  Percutaneous angioplasty and stenting of the left subclavian artery.  High risk replacement of the aortic valve with a biological prosthesis and redo CABG with preservation of the LIMA bypass and new SVG to PDA and OM.  Reevaluate carotid arteries and pulmonary function prior to surgery.  Impression:  Patient has very severe aortic stenosis with recent develop an of mild symptoms of exertional shortness of breath and fatigue. By report left ventricular systolic function is preserved. He has extremely severe native three-vessel coronary artery disease with vein graft disease status post coronary artery bypass grafting in 1996. Distal target vessels for repeat coronary artery bypass grafting appear relatively poor. The patient also has severe left subclavian artery stenosis prior to takeoff of the left internal mammary artery.  On catheterization there also appears to be severe calcification and disease involving the aortic arch as well as the abdominal aorta and iliac vessels. The patient has known history of cerebrovascular disease and underwent left carotid endarterectomy in the past. Despite these matters the patient appears remarkably well preserved and apparently remains very active physically for a gentleman his age. He has additional history of long-standing tobacco abuse and only just recently quit smoking. Risks associated with redo coronary artery bypass grafting and aortic valve replacement will undoubtedly be very high. Transcatheter aortic valve replacement may be an option, although based upon the severity of disease in the aorta it might be that he would only be candidate for transapical or transaortic approach.  His left subclavian artery stenosis will clearly need to be treated if he is to be surgically managed.   Plan:  The  patient was counseled at length regarding surgical alternatives with respect to valve replacement.  We discussed the risks and benefits associated with redo coronary artery bypass grafting and aortic valve replacement. We also discussed the possibility of transcatheter aortic valve replacement as they far less invasive alternative but with its own set of risks and benefits.  In addition we discussed continued medical therapy given the fact that the patient's symptoms remain relatively mild at present. All of his questions been addressed. The patient seems intent on pursuing a fairly aggressive strategy. We will send him for CT angiogram of the aortic valve and aorta to further assess the risks associated with conventional surgery and ascertain whether or not transcatheter valve replacement might be an option. We will obtain pulmonary function tests as well and plan to see him back back in 3 weeks for further followup.     Salvatore Decent. Cornelius Moras, MD 06/30/2011 10:41 AM

## 2011-07-04 ENCOUNTER — Other Ambulatory Visit: Payer: Self-pay | Admitting: Cardiothoracic Surgery

## 2011-07-04 ENCOUNTER — Other Ambulatory Visit: Payer: Self-pay | Admitting: Thoracic Surgery (Cardiothoracic Vascular Surgery)

## 2011-07-04 DIAGNOSIS — I350 Nonrheumatic aortic (valve) stenosis: Secondary | ICD-10-CM

## 2011-07-04 DIAGNOSIS — I7409 Other arterial embolism and thrombosis of abdominal aorta: Secondary | ICD-10-CM

## 2011-07-09 ENCOUNTER — Inpatient Hospital Stay (HOSPITAL_COMMUNITY)
Admission: RE | Admit: 2011-07-09 | Discharge: 2011-07-09 | Disposition: A | Discharge status: Home / Self Care | Payer: Medicare Other | Source: Ambulatory Visit | Attending: Thoracic Surgery (Cardiothoracic Vascular Surgery) | Admitting: Thoracic Surgery (Cardiothoracic Vascular Surgery)

## 2011-07-09 ENCOUNTER — Other Ambulatory Visit (HOSPITAL_COMMUNITY): Payer: Self-pay

## 2011-07-09 ENCOUNTER — Other Ambulatory Visit (HOSPITAL_COMMUNITY): Payer: Medicare Other

## 2011-07-09 ENCOUNTER — Ambulatory Visit (HOSPITAL_COMMUNITY)
Admission: RE | Admit: 2011-07-09 | Discharge: 2011-07-09 | Disposition: A | Discharge status: Home / Self Care | Payer: Medicare Other | Source: Ambulatory Visit | Attending: Thoracic Surgery (Cardiothoracic Vascular Surgery) | Admitting: Thoracic Surgery (Cardiothoracic Vascular Surgery)

## 2011-07-09 ENCOUNTER — Ambulatory Visit (HOSPITAL_COMMUNITY): Payer: Medicare Other

## 2011-07-09 ENCOUNTER — Inpatient Hospital Stay (HOSPITAL_COMMUNITY)
Admission: RE | Admit: 2011-07-09 | Discharge: 2011-07-09 | Disposition: A | Discharge status: Home / Self Care | Payer: Medicare Other | Source: Ambulatory Visit

## 2011-07-09 DIAGNOSIS — I7409 Other arterial embolism and thrombosis of abdominal aorta: Secondary | ICD-10-CM

## 2011-07-09 DIAGNOSIS — I359 Nonrheumatic aortic valve disorder, unspecified: Secondary | ICD-10-CM | POA: Diagnosis not present

## 2011-07-09 DIAGNOSIS — I771 Stricture of artery: Secondary | ICD-10-CM | POA: Insufficient documentation

## 2011-07-09 DIAGNOSIS — J438 Other emphysema: Secondary | ICD-10-CM | POA: Diagnosis not present

## 2011-07-09 DIAGNOSIS — I700 Atherosclerosis of aorta: Secondary | ICD-10-CM | POA: Insufficient documentation

## 2011-07-09 DIAGNOSIS — K5730 Diverticulosis of large intestine without perforation or abscess without bleeding: Secondary | ICD-10-CM | POA: Diagnosis not present

## 2011-07-09 DIAGNOSIS — J984 Other disorders of lung: Secondary | ICD-10-CM | POA: Diagnosis not present

## 2011-07-09 DIAGNOSIS — I350 Nonrheumatic aortic (valve) stenosis: Secondary | ICD-10-CM

## 2011-07-09 DIAGNOSIS — R29890 Loss of height: Secondary | ICD-10-CM | POA: Insufficient documentation

## 2011-07-09 DIAGNOSIS — R918 Other nonspecific abnormal finding of lung field: Secondary | ICD-10-CM | POA: Diagnosis not present

## 2011-07-09 DIAGNOSIS — I70299 Other atherosclerosis of native arteries of extremities, unspecified extremity: Secondary | ICD-10-CM | POA: Diagnosis not present

## 2011-07-09 DIAGNOSIS — M8448XA Pathological fracture, other site, initial encounter for fracture: Secondary | ICD-10-CM | POA: Diagnosis not present

## 2011-07-09 DIAGNOSIS — D739 Disease of spleen, unspecified: Secondary | ICD-10-CM | POA: Diagnosis not present

## 2011-07-09 DIAGNOSIS — I708 Atherosclerosis of other arteries: Secondary | ICD-10-CM | POA: Insufficient documentation

## 2011-07-09 DIAGNOSIS — I2510 Atherosclerotic heart disease of native coronary artery without angina pectoris: Secondary | ICD-10-CM | POA: Diagnosis not present

## 2011-07-09 LAB — BLOOD GAS, ARTERIAL
Bicarbonate: 22.4 mEq/L (ref 20.0–24.0)
Drawn by: 129711
FIO2: 0.21 %
pCO2 arterial: 32.1 mmHg — ABNORMAL LOW (ref 35.0–45.0)
pH, Arterial: 7.458 — ABNORMAL HIGH (ref 7.350–7.450)
pO2, Arterial: 106 mmHg — ABNORMAL HIGH (ref 80.0–100.0)

## 2011-07-09 MED ORDER — IOHEXOL 350 MG/ML SOLN: 80.0000 mL | Freq: Once | INTRAVENOUS | Status: DC | PRN

## 2011-07-09 MED ORDER — IOHEXOL 350 MG/ML SOLN: 60.0000 mL | Freq: Once | INTRAVENOUS | Status: DC | PRN

## 2011-07-09 MED ORDER — ALBUTEROL SULFATE (5 MG/ML) 0.5% IN NEBU
2.5000 mg | INHALATION_SOLUTION | Freq: Once | RESPIRATORY_TRACT | Status: AC
  Administered 2011-07-09: 2.5 mg via RESPIRATORY_TRACT

## 2011-07-09 MED ORDER — NITROGLYCERIN 0.4 MG SL SUBL
SUBLINGUAL_TABLET | SUBLINGUAL | Status: AC
  Filled 2011-07-09: qty 25

## 2011-07-11 ENCOUNTER — Encounter (HOSPITAL_COMMUNITY): Payer: Self-pay | Admitting: Pharmacy Technician

## 2011-07-21 ENCOUNTER — Ambulatory Visit (INDEPENDENT_AMBULATORY_CARE_PROVIDER_SITE_OTHER): Payer: Medicare Other | Admitting: Thoracic Surgery (Cardiothoracic Vascular Surgery)

## 2011-07-21 ENCOUNTER — Encounter: Payer: Self-pay | Admitting: Thoracic Surgery (Cardiothoracic Vascular Surgery)

## 2011-07-21 VITALS — BP 114/64 | HR 60 | Resp 18 | Ht 66.0 in | Wt 163.0 lb

## 2011-07-21 DIAGNOSIS — I359 Nonrheumatic aortic valve disorder, unspecified: Secondary | ICD-10-CM | POA: Diagnosis not present

## 2011-07-21 DIAGNOSIS — I2510 Atherosclerotic heart disease of native coronary artery without angina pectoris: Secondary | ICD-10-CM

## 2011-07-21 DIAGNOSIS — Z952 Presence of prosthetic heart valve: Secondary | ICD-10-CM | POA: Insufficient documentation

## 2011-07-21 NOTE — Progress Notes (Addendum)
[address]            [place] [address]          [phone]     CARDIOTHORACIC SURGERY OFFICE NOTE  Referring Provider is Susa Griffins, MD PCP is Marga Melnick, MD, MD   HPI:  Patient returns for further followup of severe aortic stenosis, three-vessel coronary artery disease, and vein graft disease status post coronary artery bypass grafting in 1996. He was initially seen in consultation on 06/30/2011. Since then he underwent pulmonary function tests as well as CT angiogram of the heart and great vessels. The patient returns to further discuss possible treatment options. He reports no new problems or complaints over the last few weeks. He does report one brief episode of chest discomfort that was somewhat vague and atypical and not related to physical activity. He has been scheduled for elective angioplasty and stenting of the left subclavian artery by Dr. Allyson Sabal next week.   Current Outpatient Prescriptions  Medication Sig Dispense Refill  . aspirin EC 81 MG tablet Take 81 mg by mouth daily.      . cholecalciferol (VITAMIN D) 1000 UNITS tablet Take 1,000 Units by mouth daily.      . fish oil-omega-3 fatty acids 1000 MG capsule Take 1 g by mouth daily.      . ramipril (ALTACE) 10 MG capsule Take 10 mg by mouth daily.       . rosuvastatin (CRESTOR) 20 MG tablet Take 40 mg by mouth every evening.       . zoster vaccine live, PF, (ZOSTAVAX) 21308 UNT/0.65ML injection Inject 19,400 Units into the skin once.  1 vial  0      Physical Exam:   BP 114/64  Pulse 60  Resp 18  Ht 5\' 6"  (1.676 m)  Wt 163 lb (73.936 kg)  BMI 26.31 kg/m2  SpO2 95%  General:  Well-appearing  Chest:   Clear to auscultation with symmetrical breath sounds  CV:   Regular rate and rhythm with systolic murmur  Incisions:  n/a  Abdomen:  Soft and nontender  Extremities:  Warm and well-perfused   Diagnostic Tests:  PULMONARY FUNCTION TESTS  Pulmonary function  tests performed Jul 09 2011 are reviewed. Baseline FEV1 measured 1.25 L or 56% predicted. This increased to 1.53 L or 69% predicted following bronchodilator therapy. Forced vital capacity was 2.48 L or 78% predicted. Residual volume was only 2.14 L or 88% predicted. Diffusion capacity was moderately reduced at 64% predicted    CT ANGIOGRAPHY OF THE HEART, CORONARY ARTERY, STRUCTURE, MORPHOLOGY, AND FUNCTION   CONTRAST:   80 ml of Omnipaque 350.   COMPARISON:  No priors.   TECHNIQUE:  CT angiography of the coronary vessels was performed on a 256 channel system using ECG gating.  A scout and  noncontrast exam (for calcium scoring) were performed.  Coronary CTA was performed with sub mm slice collimation through the cardiac cycle after prior injection of iodinated contrast.  Imaging post processing was performed on an independent workstation creating multiplanar, 3-D, and quantitative analysis of the heart and coronary arteries.  Note that this exam targets the heart and the chest was not imaged in its entirety.   PREMEDICATION: None   FINDINGS: Technical quality:  Excellent.   CORONARY ARTERIES: Left main coronary artery:  Severely diseased with mixed calcified noncalcified atherosclerotic plaque with areas of stenosis  in excess of 75%. Left anterior descending:  Severely diseased throughout the proximal and mid LAD with extensive calcified noncalcified atherosclerotic plaque with areas of stenosis in excess of 50% (likely in excess of 75%, however, assessment is limited by small caliber of the vessel and high degree of calcification).  Distal LAD is patent and supplied by a flow from the LIMA graft. Both diagonal branches are also severely diseased, and the first diagonal appears completely occluded shortly after its origin. Left circumflex:  Severely diseased with mixed calcified noncalcified atherosclerotic plaque with areas of stenosis of 50- 75% proximally.  The distal left  circumflex is otherwise only mildly diseased and is widely patent. Right coronary artery:  Severely diseased and completely occluded in the mid RCA.  Flow is reconstituted within the distal RCA, presumably secondary to the flow from the bypass graft or collateral vessels. Importantly, there appears to be a high-grade stenosis (>75%, likely nearly occluded) in the region of the anastomosis of the bypass graft. Posterior descending artery:  Patent. Dominance:  Codominant.   BYPASS GRAFTS: LIMA to distal LAD:  Widely patent. SVG to OM2: Mildly diseased with areas of stenosis from 0-25%. SVG to distal RCA:      The graft itself appears widely patent, however, there appears to be a severe stenosis (likely subtotal occlusion) in excess of 75% at the anastomosis between the graft in the distal RCA.   CORONARY CALCIUM: Total Agatston Score:  Not applicable (the patient has known coronary artery disease and is status post CABG).   CARDIAC FUNCTION: Ejection fraction:  70% (visually estimated). Wall motion analysis:  No regional wall motion abnormalities.   CARDIAC MEASUREMENTS: Interventricular septum (6 - 12 mm):  16 mm LV posterior wall (6 - 12 mm):  12 mm LV diameter in diastole (35 - 52 mm):  39 mm LV diameter at end systole (21 - 40 mm):  27 mm LA diameter at end systole (19 - 40 mm):  44 mm   AORTIC VALVE: The aortic valve is tricuspid and is markedly thickened and severely calcified.  There is limited mobility of all of the cusps, with particularly limited motion at the commissure between the noncoronary and right cusp.  During peak systole maximal aortic valve area was estimated at 0.6 cm2 by planimetry.   AORTIC ROOT MEASUREMENTS PERTINENT TO TAVI PROCEDURE:   Annulus: Planimetry - 4.4 cm2 Long & Short Axis - 27 x 21 mm Circumference - 76.2 mm   Sinuses of Valsalva: R SOV - width 20.7 mm height 30.3 mm   L SOV - width 31.7 mm height 17.0 mm   Haynes SOV - width  32.3 mm height 19.7 mm   Coronary Artery Ostia: L main - 13.0 mm from the annulus RCA - 15.2 mm from the annulus   Ascending Aorta:   30 mm in diameter 4 cm above the annulus.   EXTRACARDIAC FINDINGS: Please see separate dictation for CTA of chest abdomen and pelvis from 07/09/2011 for full details of extracardiac findings.   FINDINGS PERTINENT TO POTENTIAL REDO STERNOTOMY: LIMA to LAD: 3.8 cm deep to the sternum, approximately 2.6 cm to the left of midline at its most superficial position. SVG to OM2 graft: Ostium is 3 cm deep to the sternum in the midline. SVG to distal RCA graft: The most superficial point of the mid graft is 2.2 cm deep to the sternum approximate 2.1 cm to the right of midline.   IMPRESSION:   1. The aortic valve  is tricuspid and is severely sclerotic (estimated valve area of 0.6 cm2 by planimetry).  Measurements pertinent to potential the transcatheter aortic valve implantation procedure, as above.  Additionally, findings pertinent to potential redo sternotomy are also indicated above. 2.  Postoperative changes of sternotomy for CABG, as above. Notably, there is a severe stenosis (likely subtotal occlusion) in excess of 75% near the anastomosis of the saphenous vein graft to the distal RCA. 3.  Normal resting left ventricular wall motion with estimated ejection fraction of 70%. 4.  Mild basal septal hypertrophy. 5.  Mild left atrial dilatation. 6.  Codominance of the coronary arteries.   Original Report Authenticated By: Florencia Reasons, M.D.        CT ANGIOGRAPHY CHEST, ABDOMEN AND PELVIS   Technique:  Multidetector CT imaging through the chest, abdomen and pelvis was performed using the standard protocol during bolus administration of intravenous contrast.  Multiplanar reconstructed images including MIPs were obtained and reviewed to evaluate the vascular anatomy.   Contrast:  65 ml of Omnipaque 350.   Comparison:   None.   CTA  CHEST   Findings:   Mediastinum: Please see separate dictation for all cardiac related findings. No pathologically enlarged mediastinal or hilar lymph nodes. Atherosclerosis throughout the thoracic aorta (heavy burden of both calcified and noncalcified plaque), without evidence of aneurysm or dissection. There is greater than 50% stenosis of the proximal left subclavian artery (immediately before the origin of the left vertebral artery).  Esophagus is unremarkable in appearance.  Postoperative changes of median sternotomy for CABG with a LIMA to the LAD and saphenous vein grafts to obtuse marginal and distal right coronary artery circulations.   Lungs/Pleura: 7 mm nodule in the inferior segment of the lingula (image 94 series six).  5 mm nodule with spiculated margins (image 31 of series six) in the periphery of the right upper lobe.  4 mm subpleural nodule associated with the lateral aspect of the minor fissure (image 73 of series six).  4 mm nodule in the medial segment of the right middle lobe (image 82 of series six). Multiple other scattered smaller pulmonary nodules seen in the lungs bilaterally. A background of moderate centrilobular and mild paraseptal emphysema.  Mild diffuse bronchial wall thickening.  No acute consolidative airspace disease.  No pleural effusions.   Musculoskeletal: Median sternotomy wires.  Compression fractures at T7 and T12 are noted, without acute features.  There is approximately 50% loss of height anteriorly at T7, and 40% loss of height anteriorly at T12.  There are no aggressive appearing lytic or blastic lesions noted in the visualized portions of the skeleton.    Review of the MIP images confirms the above findings.   IMPRESSION: 1.  Extensive atherosclerosis throughout the thoracic aorta without evidence of aneurysm or dissection.  There is, however, a potentially hemodynamically significant stenosis in the proximal left subclavian artery  immediately before the origin of the left vertebral artery. 2.  Smoking related changes in the lungs, including mild diffuse bronchial wall thickening, moderate centrilobular and mild paraseptal emphysema with numerous bilateral pulmonary nodules, the largest of which is 7 mm within the inferior segment lingula. These warrant attention on follow-up studies.  At this time, a follow-up chest CT at 3-6 months is recommended.  This recommendation follows the consensus statement: Guidelines for Management of Small Pulmonary Nodules Detected on CT Scans: A Statement from the Fleischner Society as published in Radiology 2005; 237:395-400. 3.  Compression fractures at T7 and T12, as  above, which appear to be related to remote injuries.   CTA ABDOMEN AND PELVIS   Findings:   Abdomen/Pelvis:  The enhanced appearance of the liver, pancreas, spleen, bilateral adrenal glands and the right kidney is unremarkable. There are multiple low attenuation lesions associated with the left renal collecting system, most compatible with peripelvic cysts.  There is a small splenule inferior to the spleen incidentally noted.  There is extensive atherosclerosis in the abdominal and pelvic vasculature (see measurements below) including regions within the distal abdominal aorta (inferior to the origin of the IMA) where there is displacement of the intimal calcifications, suggestive of short segment dissections (best demonstrated on images 186 and 193 of series 5).  No ascites or pneumoperitoneum and no pathologic distension of bowel.  No definite pathologic adenopathy.  There is extensive colonic diverticulosis, most severe in the region of the sigmoid colon, without surrounding inflammatory changes to suggest acute diverticulitis at this time.  Normal appendix.  Ectopic location of the cecum (in the mid abdomen).  Small bowel is clustered predominately in the right side of the abdomen, and the  overall appearance is most consistent with malrotation.   Vascular Findings Pertinent to Potential TAVI Procedure:   LEFT:   L Common Iliac -  minimum diameters: 3.3 x 7.3 mm                     tortuosity: Mild                     calcification: Severe   L External Iliac -      minimum diameters: 5.8 x 6.3 mm                     tortuosity: Moderate                     calcification: Mild   L Common Femoral -      minimum diameters: 8.5 x 5.5 mm                     tortuosity: Mild                     calcification: Mild   RIGHT:   R Common Iliac -  minimum diameters: 2.2 x 10.2 mm                     tortuosity: Mild                     calcification: Severe   R External Iliac -      minimum diameters: 6.0 x 5.9 mm                     tortuosity: Moderate                     calcification: Mild   R Common Femoral -      minimum diameters: 6.1 x 5.9 mm                     tortuosity: Mild                     calcification: Mild   ABDOMINAL AORTA:   Minimum diameters: 10.7 x 8.5 mm (please note that this is in an area where there is an apparent short segment dissection, and  this represents the internal diameter of the true lumen)   Musculoskeletal: There are no aggressive appearing lytic or blastic lesions noted in the visualized portions of the skeleton.    Review of the MIP images confirms the above findings.   IMPRESSION: 1.  Vascular findings pertinent to potential TAVI  procedure, as detailed above.  Notably, secondary to severe calcific plaque within the common iliac arteries bilaterally, there does not appear to be suitable pelvic access in this patient at this time. Additionally, there appears to be a short segment dissection of the distal abdominal aorta, which could complicate catheter-based approach. 2.  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 This is an incidental finding in a patient of this age (younger patients may  be at increased risk for bowel obstruction). 3.  Extensive colonic diverticulosis without evidence to suggest acute diverticulitis at this time.   Original Report Authenticated By: Florencia Reasons, M.D.         Impression:  I've reviewed the results of the patient's recent primary function tests and CT angiograms with the patient and his wife here in the office today. CT angiograms confirmed the presence of severe peripheral vascular disease involving the entire aorta and all major vessels. It is clear that the patient would not be candidate for transfemoral approach for transcatheter aortic valve replacement. He probably could not be candidate for transfer catheter therapy through the subclavian arteries either, and he needs angioplasty and stenting of the left subclavian artery which has been scheduled for next week. Transcatheter aortic valve replacement could be considered via trans-apical approach and possibly transaortic, although the patient has significant calcification involving the ascending aorta and transverse arch as well. Under the circumstances, the severity of disease involving the ascending thoracic aorta and arch vessels has implications regarding risks related to conventional aortic valve replacement with redo coronary artery bypass grafting. However, the patient does not have what might be considered a porcelain aorta and the conventional surgical approach remains an option, although very high risk.  The patient has moderate-severe chronic obstructive pulmonary disease.    Plan:  I discussed matters with the patient and his wife again here in the office today. We discussed options including continued medical therapy without intervention versus high-risk redo coronary artery bypass grafting with aortic valve replacement versus transcatheter aortic valve replacement via a trans-apical approach with or without concomitant percutaneous coronary intervention. The patient remains  interested in pursuing fairly aggressive approach to his care, although he is not certain about the risks of surgery or what role transcatheter aortic valve replacement might play. In either case he would need to have successful angioplasty and stenting of left subclavian artery due to the fact that such a large amount of his coronary circulation is dependent upon the patent left internal mammary artery graft. Under the circumstances it makes sense to proceed with this as scheduled next week. Once this has been accomplished we can further discuss treatment options and make more definitive long-term plans. All his questions been addressed.   Salvatore Decent. Cornelius Moras, MD 07/21/2011 4:36 PM     The patient's pertinent data were entered into the STS risk calculator with results as noted below:  Mortality   15% Morbidity or mortality 50% Prolonged LOS  31% Permanent Stroke  5.5% Respiratory Failure  42% Renal Failure   14.4%  The STS risk calculator does not take into account the degree of calcification and disease involving the patient's ascending and aorta and transverse  aortic arch, which I believe increase his risks with conventional surgery substantially.    Salvatore Decent. Cornelius Moras, MD 07/22/2011 2:43 PM

## 2011-07-22 DIAGNOSIS — R5381 Other malaise: Secondary | ICD-10-CM | POA: Diagnosis not present

## 2011-07-22 DIAGNOSIS — R6889 Other general symptoms and signs: Secondary | ICD-10-CM | POA: Diagnosis not present

## 2011-07-22 DIAGNOSIS — D689 Coagulation defect, unspecified: Secondary | ICD-10-CM | POA: Diagnosis not present

## 2011-07-22 DIAGNOSIS — R5383 Other fatigue: Secondary | ICD-10-CM | POA: Diagnosis not present

## 2011-07-25 ENCOUNTER — Other Ambulatory Visit: Payer: Self-pay | Admitting: Cardiovascular Disease

## 2011-07-28 ENCOUNTER — Encounter (HOSPITAL_COMMUNITY)
Admission: RE | Disposition: A | Discharge status: Home / Self Care | Payer: Self-pay | Source: Ambulatory Visit | Attending: Cardiovascular Disease

## 2011-07-28 ENCOUNTER — Ambulatory Visit (HOSPITAL_COMMUNITY)
Admission: RE | Admit: 2011-07-28 | Discharge: 2011-07-28 | Disposition: A | Discharge status: Home Health | Payer: Medicare Other | Source: Ambulatory Visit | Attending: Cardiovascular Disease | Admitting: Cardiovascular Disease

## 2011-07-28 ENCOUNTER — Encounter (HOSPITAL_COMMUNITY): Payer: Self-pay | Admitting: Radiology

## 2011-07-28 ENCOUNTER — Ambulatory Visit (HOSPITAL_COMMUNITY): Payer: Medicare Other

## 2011-07-28 ENCOUNTER — Ambulatory Visit (HOSPITAL_COMMUNITY)
Admission: RE | Admit: 2011-07-28 | Discharge: 2011-07-28 | Disposition: A | Discharge status: Home / Self Care | Payer: Medicare Other | Source: Ambulatory Visit | Attending: Cardiovascular Disease | Admitting: Cardiovascular Disease

## 2011-07-28 DIAGNOSIS — R001 Bradycardia, unspecified: Secondary | ICD-10-CM | POA: Diagnosis present

## 2011-07-28 DIAGNOSIS — Z951 Presence of aortocoronary bypass graft: Secondary | ICD-10-CM

## 2011-07-28 DIAGNOSIS — I350 Nonrheumatic aortic (valve) stenosis: Secondary | ICD-10-CM | POA: Diagnosis present

## 2011-07-28 DIAGNOSIS — I7409 Other arterial embolism and thrombosis of abdominal aorta: Secondary | ICD-10-CM | POA: Diagnosis present

## 2011-07-28 DIAGNOSIS — I679 Cerebrovascular disease, unspecified: Secondary | ICD-10-CM | POA: Diagnosis present

## 2011-07-28 DIAGNOSIS — I771 Stricture of artery: Secondary | ICD-10-CM | POA: Diagnosis present

## 2011-07-28 DIAGNOSIS — I7090 Unspecified atherosclerosis: Secondary | ICD-10-CM | POA: Insufficient documentation

## 2011-07-28 DIAGNOSIS — I2510 Atherosclerotic heart disease of native coronary artery without angina pectoris: Secondary | ICD-10-CM | POA: Insufficient documentation

## 2011-07-28 DIAGNOSIS — E785 Hyperlipidemia, unspecified: Secondary | ICD-10-CM | POA: Insufficient documentation

## 2011-07-28 DIAGNOSIS — I10 Essential (primary) hypertension: Secondary | ICD-10-CM | POA: Insufficient documentation

## 2011-07-28 DIAGNOSIS — I359 Nonrheumatic aortic valve disorder, unspecified: Secondary | ICD-10-CM | POA: Insufficient documentation

## 2011-07-28 DIAGNOSIS — I659 Occlusion and stenosis of unspecified precerebral artery: Secondary | ICD-10-CM | POA: Diagnosis not present

## 2011-07-28 DIAGNOSIS — Z9889 Other specified postprocedural states: Secondary | ICD-10-CM

## 2011-07-28 DIAGNOSIS — I498 Other specified cardiac arrhythmias: Secondary | ICD-10-CM | POA: Insufficient documentation

## 2011-07-28 DIAGNOSIS — I959 Hypotension, unspecified: Secondary | ICD-10-CM | POA: Diagnosis present

## 2011-07-28 SURGERY — BILATERAL UPPER EXTREMITY ANGIOGRAM
Anesthesia: LOCAL

## 2011-07-28 MED ORDER — SODIUM CHLORIDE 0.9 % IJ SOLN: 3.0000 mL | INTRAMUSCULAR | Status: DC | PRN

## 2011-07-28 MED ORDER — IOHEXOL 350 MG/ML SOLN: 50.0000 mL | Freq: Once | INTRAVENOUS | Status: DC | PRN

## 2011-07-28 MED ORDER — SODIUM CHLORIDE 0.9 % IV SOLN: 250.0000 mL | INTRAVENOUS | Status: DC | PRN

## 2011-07-28 MED ORDER — DIAZEPAM 2 MG PO TABS: 2.0000 mg | ORAL_TABLET | ORAL | Status: DC

## 2011-07-28 MED ORDER — ASPIRIN 81 MG PO CHEW
324.0000 mg | CHEWABLE_TABLET | ORAL | Status: AC
  Administered 2011-07-28: 324 mg via ORAL
  Filled 2011-07-28: qty 4

## 2011-07-28 MED ORDER — SODIUM CHLORIDE 0.9 % IV SOLN
INTRAVENOUS | Status: DC
  Administered 2011-07-28: 08:00:00 via INTRAVENOUS

## 2011-07-28 MED ORDER — IOHEXOL 350 MG/ML SOLN
50.0000 mL | Freq: Once | INTRAVENOUS | Status: AC | PRN
  Administered 2011-07-28: 50 mL via INTRAVENOUS

## 2011-07-28 NOTE — Progress Notes (Signed)
Pt transported to CT via stretcher per transporter.

## 2011-07-28 NOTE — H&P (Signed)
Luis Townsend is an 78 y.o. male.   Chief Complaint:  HPI:   The patient is an 78 year old Caucasian male with history of coronary artery disease and severe subaortic stenosis, bilateral subclavian and carotid disease, chronic right bundle branch block, tobacco abuse (quit 30 days ago).  Patient underwent coronary artery bypass grafting in 1995.  Last echocardiogram was 06/10/2011 showed progression of his aortic stenosis with a CW Doppler gradient of 5.1 m/s AVA of 0.59 cm with good LV function.  Patient recently underwent right and left heart catheterizations on the 14th 2013 this revealed critical aortic stenosis(aortic valve area was 0.54 cm), elevated LVEDP without increase in left atrial mean pressure of PA pressure arrest. As well as severe native and graft coronary disease. Recommendation was for percutaneous angioplasty and stenting of left subclavian artery as well as high risk replacement of aortic valve biological prosthesis and redo CABG with preservation of the LIMA bypass to the new SVG to PDA and OM.   The patient presents today for left subclavian stenting. He denies nausea, vomiting, fever, chest pain, shortness of breath, PND, orthopnea, abdominal pain, dysuria, hematuria, hematochezia, dizziness or lightheadedness.   Past Medical History  Diagnosis Date  . Myocardial infarction 03/22/1994    acute LAD occlusion  . Hypertension   . Hyperlipidemia   . S/P CABG x 5 03/28/1994    LIMA to LAD, sequential SVG to OM1-OM2-OM4, SVG to RCA, open vein harvest right leg - Dr Andrey Campanile  . Cerebrovascular disease 06/30/2011  . S/P carotid endarterectomy 10/28/1996    Dr Arbie Cookey  . Subclavian artery stenosis, left 06/30/2011  . Aortoiliac occlusive disease 06/30/2011  . Aortic stenosis 06/30/2011    severe  . Amaurosis fugax of left eye 10/11/1996  . Tobacco abuse     Past Surgical History  Procedure Date  . Carotid endarterectomy 10/28/96  DR. TODD EARLY    LEFT AND DACRON PATCH ANGIOPLASTY    . Coronary artery bypass graft 03/28/94 DR. CHARLES WILSON    x5(LIMA to LAD,SVG to OM1-OM2-OM4, SVG to RCA    Family History  Problem Relation Age of Onset  . Stroke Mother    Social History:  reports that he quit smoking about 4 months ago. He has never used smokeless tobacco. He reports that he does not drink alcohol. His drug history not on file.  Allergies: No Known Allergies  Medications Prior to Admission  Medication Sig Dispense Refill  . aspirin EC 81 MG tablet Take 81 mg by mouth daily.      . cholecalciferol (VITAMIN D) 1000 UNITS tablet Take 1,000 Units by mouth daily.      . fish oil-omega-3 fatty acids 1000 MG capsule Take 1 g by mouth daily.      . ramipril (ALTACE) 10 MG capsule Take 10 mg by mouth daily.       . rosuvastatin (CRESTOR) 20 MG tablet Take 40 mg by mouth every evening.       . zoster vaccine live, PF, (ZOSTAVAX) 78295 UNT/0.65ML injection Inject 19,400 Units into the skin once.  1 vial  0    No results found for this or any previous visit (from the past 48 hour(s)). No results found.  Review of Systems  Constitutional: Negative for fever, chills and diaphoresis.  HENT: Positive for congestion (occasional).   Eyes: Negative for double vision.  Respiratory: Positive for cough (occasional). Negative for shortness of breath.   Cardiovascular: Negative for chest pain, orthopnea, claudication, leg swelling and  PND.  Gastrointestinal: Negative for nausea, vomiting, abdominal pain, blood in stool and melena.  Genitourinary: Negative for dysuria and hematuria.  Neurological: Negative for dizziness, tingling and headaches.    Blood pressure 90/50, pulse 47, temperature 97 F (36.1 C), temperature source Oral, resp. rate 18, height 5\' 6"  (1.676 m), weight 71.668 kg (158 lb), SpO2 95.00%. Physical Exam  Constitutional: He is oriented to person, place, and time. He appears well-developed and well-nourished. No distress.  HENT:  Head: Normocephalic and  atraumatic.  Eyes: EOM are normal. Pupils are equal, round, and reactive to light. No scleral icterus.  Neck: Normal range of motion. Neck supple. No JVD present.  Cardiovascular: Regular rhythm.  Bradycardia present.   Murmur heard.  Systolic murmur is present with a grade of 3/6  Pulses:      Radial pulses are 2+ on the right side, and 2+ on the left side.       Dorsalis pedis pulses are 1+ on the right side, and 1+ on the left side.       Posterior tibial pulses are 1+ on the right side, and 1+ on the left side.       Bilateral carotid bruit which may actually represent radiation of aortic stenosis.  No femoral bruit  Respiratory: Effort normal. He has no wheezes. He has no rales.       Decreased breath sounds bilaterally  GI: Soft. Bowel sounds are normal. He exhibits no distension. There is no tenderness.  Musculoskeletal: He exhibits no edema.  Lymphadenopathy:    He has no cervical adenopathy.  Neurological: He is alert and oriented to person, place, and time. He exhibits normal muscle tone.  Skin: Skin is warm.  Psychiatric: He has a normal mood and affect.     Assessment/Plan Patient Active Hospital Problem List: Subclavian artery stenosis, left (06/30/2011) Bradycardia (07/28/2011) Coronary atherosclerosis of native coronary artery (06/30/2011) Aortic stenosis (06/30/2011) Hypertension () Hyperlipidemia () S/P CABG x 5 (03/28/1994) Cerebrovascular disease (06/30/2011) S/P carotid endarterectomy (10/28/1996) Aortoiliac occlusive disease (06/30/2011)  Plan:  Patient presents for left subclavian stenting.   Hypotension likely related to BP being taken in left arm.  When repeated in the right, it was 113/53.   Luis Townsend 07/28/2011, 8:26 AM

## 2011-07-28 NOTE — Discharge Instructions (Signed)

## 2011-07-29 ENCOUNTER — Encounter (HOSPITAL_COMMUNITY)
Admission: RE | Disposition: A | Discharge status: Home Health | Payer: Self-pay | Source: Ambulatory Visit | Attending: Cardiovascular Disease

## 2011-07-29 ENCOUNTER — Encounter (HOSPITAL_COMMUNITY): Payer: Self-pay | Admitting: Cardiology

## 2011-07-29 ENCOUNTER — Ambulatory Visit (HOSPITAL_COMMUNITY): Admit: 2011-07-29 | Payer: Medicare Other | Admitting: Cardiovascular Disease

## 2011-07-29 ENCOUNTER — Encounter (HOSPITAL_COMMUNITY)
Admission: RE | Disposition: A | Discharge status: Home / Self Care | Payer: Self-pay | Source: Ambulatory Visit | Attending: Cardiovascular Disease

## 2011-07-29 ENCOUNTER — Inpatient Hospital Stay (HOSPITAL_COMMUNITY)
Admission: RE | Admit: 2011-07-29 | Discharge: 2011-07-31 | DRG: 253 | Disposition: A | Discharge status: Home Health | Payer: Medicare Other | Source: Ambulatory Visit | Attending: Cardiovascular Disease | Admitting: Cardiovascular Disease

## 2011-07-29 DIAGNOSIS — I7409 Other arterial embolism and thrombosis of abdominal aorta: Secondary | ICD-10-CM | POA: Diagnosis present

## 2011-07-29 DIAGNOSIS — I10 Essential (primary) hypertension: Secondary | ICD-10-CM | POA: Diagnosis present

## 2011-07-29 DIAGNOSIS — I359 Nonrheumatic aortic valve disorder, unspecified: Secondary | ICD-10-CM | POA: Diagnosis present

## 2011-07-29 DIAGNOSIS — J449 Chronic obstructive pulmonary disease, unspecified: Secondary | ICD-10-CM | POA: Diagnosis present

## 2011-07-29 DIAGNOSIS — J4489 Other specified chronic obstructive pulmonary disease: Secondary | ICD-10-CM | POA: Diagnosis present

## 2011-07-29 DIAGNOSIS — I350 Nonrheumatic aortic (valve) stenosis: Secondary | ICD-10-CM | POA: Diagnosis present

## 2011-07-29 DIAGNOSIS — Z9889 Other specified postprocedural states: Secondary | ICD-10-CM

## 2011-07-29 DIAGNOSIS — I700 Atherosclerosis of aorta: Secondary | ICD-10-CM | POA: Diagnosis present

## 2011-07-29 DIAGNOSIS — I2510 Atherosclerotic heart disease of native coronary artery without angina pectoris: Secondary | ICD-10-CM | POA: Diagnosis present

## 2011-07-29 DIAGNOSIS — R001 Bradycardia, unspecified: Secondary | ICD-10-CM | POA: Diagnosis present

## 2011-07-29 DIAGNOSIS — I25810 Atherosclerosis of coronary artery bypass graft(s) without angina pectoris: Secondary | ICD-10-CM | POA: Diagnosis present

## 2011-07-29 DIAGNOSIS — I771 Stricture of artery: Secondary | ICD-10-CM | POA: Diagnosis present

## 2011-07-29 DIAGNOSIS — I498 Other specified cardiac arrhythmias: Secondary | ICD-10-CM | POA: Diagnosis not present

## 2011-07-29 DIAGNOSIS — I708 Atherosclerosis of other arteries: Principal | ICD-10-CM | POA: Diagnosis present

## 2011-07-29 DIAGNOSIS — Z87891 Personal history of nicotine dependence: Secondary | ICD-10-CM

## 2011-07-29 DIAGNOSIS — G458 Other transient cerebral ischemic attacks and related syndromes: Secondary | ICD-10-CM | POA: Diagnosis not present

## 2011-07-29 DIAGNOSIS — Z951 Presence of aortocoronary bypass graft: Secondary | ICD-10-CM

## 2011-07-29 DIAGNOSIS — E785 Hyperlipidemia, unspecified: Secondary | ICD-10-CM | POA: Diagnosis present

## 2011-07-29 DIAGNOSIS — I679 Cerebrovascular disease, unspecified: Secondary | ICD-10-CM | POA: Diagnosis present

## 2011-07-29 HISTORY — PX: OTHER SURGICAL HISTORY: SHX169

## 2011-07-29 HISTORY — DX: Chronic obstructive pulmonary disease, unspecified: J44.9

## 2011-07-29 HISTORY — PX: BILATERAL UPPER EXTREMITY ANGIOGRAM: SHX5503

## 2011-07-29 LAB — POCT I-STAT, CHEM 8
HCT: 37 % — ABNORMAL LOW (ref 39.0–52.0)
Hemoglobin: 12.6 g/dL — ABNORMAL LOW (ref 13.0–17.0)
Potassium: 4.5 mEq/L (ref 3.5–5.1)
Sodium: 139 mEq/L (ref 135–145)

## 2011-07-29 LAB — CBC
HCT: 33.5 % — ABNORMAL LOW (ref 39.0–52.0)
MCH: 32.8 pg (ref 26.0–34.0)
MCV: 92.3 fL (ref 78.0–100.0)
RBC: 3.63 MIL/uL — ABNORMAL LOW (ref 4.22–5.81)
RDW: 13 % (ref 11.5–15.5)
WBC: 8.7 10*3/uL (ref 4.0–10.5)

## 2011-07-29 LAB — POCT ACTIVATED CLOTTING TIME
Activated Clotting Time: 204 seconds
Activated Clotting Time: 214 seconds
Activated Clotting Time: 185 seconds

## 2011-07-29 SURGERY — BILATERAL UPPER EXTREMITY ANGIOGRAM
Anesthesia: LOCAL

## 2011-07-29 MED ORDER — HEPARIN SODIUM (PORCINE) 1000 UNIT/ML IJ SOLN
INTRAMUSCULAR | Status: AC
  Filled 2011-07-29: qty 1

## 2011-07-29 MED ORDER — SODIUM CHLORIDE 0.9 % IV SOLN: 250.0000 mL | INTRAVENOUS | Status: DC | PRN

## 2011-07-29 MED ORDER — SODIUM CHLORIDE 0.9 % IV SOLN: INTRAVENOUS | Status: AC

## 2011-07-29 MED ORDER — MORPHINE SULFATE 2 MG/ML IJ SOLN: 1.0000 mg | INTRAMUSCULAR | Status: DC | PRN

## 2011-07-29 MED ORDER — SODIUM CHLORIDE 0.9 % IJ SOLN: 3.0000 mL | Freq: Two times a day (BID) | INTRAMUSCULAR | Status: DC

## 2011-07-29 MED ORDER — HEPARIN (PORCINE) IN NACL 2-0.9 UNIT/ML-% IJ SOLN
INTRAMUSCULAR | Status: AC
  Filled 2011-07-29: qty 1000

## 2011-07-29 MED ORDER — SODIUM CHLORIDE 0.9 % IJ SOLN: 3.0000 mL | INTRAMUSCULAR | Status: DC | PRN

## 2011-07-29 MED ORDER — LIDOCAINE HCL (PF) 1 % IJ SOLN
INTRAMUSCULAR | Status: AC
  Filled 2011-07-29: qty 30

## 2011-07-29 MED ORDER — ACETAMINOPHEN 325 MG PO TABS: 650.0000 mg | ORAL_TABLET | ORAL | Status: DC | PRN

## 2011-07-29 MED ORDER — ASPIRIN 81 MG PO CHEW
CHEWABLE_TABLET | ORAL | Status: AC
  Filled 2011-07-29: qty 4

## 2011-07-29 MED ORDER — ASPIRIN 81 MG PO CHEW
324.0000 mg | CHEWABLE_TABLET | ORAL | Status: AC
  Administered 2011-07-29: 324 mg via ORAL

## 2011-07-29 MED ORDER — SODIUM CHLORIDE 0.9 % IV SOLN: INTRAVENOUS | Status: DC

## 2011-07-29 MED ORDER — ONDANSETRON HCL 4 MG/2ML IJ SOLN: 4.0000 mg | Freq: Four times a day (QID) | INTRAMUSCULAR | Status: DC | PRN

## 2011-07-29 NOTE — Progress Notes (Addendum)
Pt had 4.32 sec pause on monitor, followed by nausea with small amount of emesis.  Pt logrolled to vomit, and when returned to supine position, left groin swollen and hard.  Pressure held to site, cath lab RN called, and came to bedside. MD paged x3 about pause, but no return call yet.  Pt VSS, left groin now soft and not swollen. Will continue to monitor.   Roselie Awkward, RN  07/29/11 2105 NP called back. CBC ordered at 2200.  Will continue to monitor.   Elisha Headland RN

## 2011-07-29 NOTE — H&P (Signed)
  H & P will be scanned in.  Pt was reexamined and existing H & P reviewed. No changes found.  Runell Gess, MD [facility] 07/29/2011 2:14 PM

## 2011-07-29 NOTE — Op Note (Signed)
Luis Townsend is a 78 y.o. male    [id number] LOCATION:  FACILITY: [facility]  PHYSICIAN: Nanetta Batty, M.D. 1928/07/20   DATE OF PROCEDURE:  07/29/2011  DATE OF DISCHARGE:  [facility]  PV Intervention    History obtained from chart review. Luis Townsend is an 78 year old married Caucasian male patient of Dr. Durene Romans has a history of remote coronary bypass grafting in 1995. He has critical aortic stenosis with valve area of 0.6 m as well as a diseased vein graft to his PDA and a high-grade left subclavian artery stenosis proximal to his left internal mammary artery which was utilized as a graft. He is symptomatic. He is high risk for redo bypass surgery given his age, comorbidities and calcified aortic root. He presents now for PTA and stenting of his left subclavian artery in order to preserve his left internal mammary artery for potential TAVR via the apical approach.   PROCEDURE DESCRIPTION:    The patient was brought to the second floor  [facility] cath lab in the postabsorptive state. He was not  premedicated His left and right groins Were prepped and shaved in usual sterile fashion. Xylocaine 1% was used  for local anesthesia. A 6 French sheath was inserted into the right left common femoral  artery using standard Seldinger technique. Attempt was made to access the aorta via the right common femoral however because of the diffuse calcific and obstructive nature of his iliac artery it was decided to go in the left side after performing an abdominal aortogram with a 5 French pigtail catheter. A 6 French 90 cm long sheath was advanced into the aorta with great difficulty ultimately using a "Lunderquist wire" and a 6.5 [place] long JB 1 catheter. Resulting pouches for the entirety of the case. Retrograde aorta pressures monitored during the case. The patient received 6000 units of heparin intravenously with an ACT of 204 at the end of the case.  A total of 107 cc of contrast was administered to the patient.  The sheath was advanced into the left subclavian artery. The lesion was crossed with a 35 angled Glidewire and a JB 1 catheter across the lesion, and the Versicore  wire was then exchanged. PT is performed with a 4 mm a 2 cm balloon. Stenting was then performed with a 7 mm x 18 mm long Cordis Genesis balloon stepping out. This was carefully positioned angiographically to be proximal to the takeoff of the internal mammary artery though it did cover the left vertebral artery. Mylanta result with reduction of a 90% left subclavian artery stenosis to 0% residual. The left vertebral remained intact. There was excellent flow down the IMA.  HEMODYNAMICS:    AO SYSTOLIC/AO DIASTOLIC: 119/68   IMPRESSION:successful PTA and stent of left subclavian artery the patient with an internal mammary artery graft to the LAD, critical aortic stenosis requiring TAVR. The patient will probably require his RCA vein graft to be percutaneously revascularized as well. The sheath was then secured in place the patient left unstable condition. Was hydrated overnight. She is to be removed and partial below the groin to achieve hemostasis. He will go any step down unit and 2900 for observation.   Runell Gess MD, [facility] 07/29/2011 3:55 PM

## 2011-07-30 DIAGNOSIS — E785 Hyperlipidemia, unspecified: Secondary | ICD-10-CM | POA: Diagnosis present

## 2011-07-30 DIAGNOSIS — I708 Atherosclerosis of other arteries: Secondary | ICD-10-CM | POA: Diagnosis present

## 2011-07-30 DIAGNOSIS — I700 Atherosclerosis of aorta: Secondary | ICD-10-CM | POA: Diagnosis present

## 2011-07-30 DIAGNOSIS — I359 Nonrheumatic aortic valve disorder, unspecified: Secondary | ICD-10-CM | POA: Diagnosis not present

## 2011-07-30 DIAGNOSIS — I25810 Atherosclerosis of coronary artery bypass graft(s) without angina pectoris: Secondary | ICD-10-CM | POA: Diagnosis present

## 2011-07-30 DIAGNOSIS — Z87891 Personal history of nicotine dependence: Secondary | ICD-10-CM | POA: Diagnosis not present

## 2011-07-30 DIAGNOSIS — I2510 Atherosclerotic heart disease of native coronary artery without angina pectoris: Secondary | ICD-10-CM | POA: Diagnosis not present

## 2011-07-30 DIAGNOSIS — J449 Chronic obstructive pulmonary disease, unspecified: Secondary | ICD-10-CM | POA: Diagnosis present

## 2011-07-30 DIAGNOSIS — I10 Essential (primary) hypertension: Secondary | ICD-10-CM | POA: Diagnosis present

## 2011-07-30 DIAGNOSIS — I498 Other specified cardiac arrhythmias: Secondary | ICD-10-CM | POA: Diagnosis not present

## 2011-07-30 DIAGNOSIS — G458 Other transient cerebral ischemic attacks and related syndromes: Secondary | ICD-10-CM | POA: Diagnosis not present

## 2011-07-30 LAB — CBC
HCT: 33.7 % — ABNORMAL LOW (ref 39.0–52.0)
Hemoglobin: 12.2 g/dL — ABNORMAL LOW (ref 13.0–17.0)
MCH: 33.2 pg (ref 26.0–34.0)
RBC: 3.67 MIL/uL — ABNORMAL LOW (ref 4.22–5.81)

## 2011-07-30 LAB — BASIC METABOLIC PANEL
Chloride: 104 mEq/L (ref 96–112)
GFR calc non Af Amer: 68 mL/min — ABNORMAL LOW (ref >90)
Glucose, Bld: 104 mg/dL — ABNORMAL HIGH (ref 70–99)
Potassium: 4 mEq/L (ref 3.5–5.1)
Sodium: 137 mEq/L (ref 135–145)

## 2011-07-30 MED ORDER — ATORVASTATIN CALCIUM 40 MG PO TABS
40.0000 mg | ORAL_TABLET | Freq: Every day | ORAL | Status: DC
  Administered 2011-07-30: 40 mg via ORAL
  Filled 2011-07-30 (×2): qty 1

## 2011-07-30 MED ORDER — ALPRAZOLAM 0.25 MG PO TABS: 0.2500 mg | ORAL_TABLET | Freq: Three times a day (TID) | ORAL | Status: DC | PRN

## 2011-07-30 MED ORDER — TRAMADOL HCL 50 MG PO TABS: 50.0000 mg | ORAL_TABLET | Freq: Four times a day (QID) | ORAL | Status: DC | PRN

## 2011-07-30 MED ORDER — OMEGA-3-ACID ETHYL ESTERS 1 G PO CAPS
1.0000 g | ORAL_CAPSULE | Freq: Every day | ORAL | Status: DC
  Administered 2011-07-30 – 2011-07-31 (×2): 1 g via ORAL
  Filled 2011-07-30 (×2): qty 1

## 2011-07-30 MED ORDER — OMEGA-3 FATTY ACIDS 1000 MG PO CAPS: 1.0000 g | ORAL_CAPSULE | Freq: Every day | ORAL | Status: DC

## 2011-07-30 MED ORDER — ZOLPIDEM TARTRATE 5 MG PO TABS: 5.0000 mg | ORAL_TABLET | Freq: Every evening | ORAL | Status: DC | PRN

## 2011-07-30 MED ORDER — ASPIRIN EC 81 MG PO TBEC
81.0000 mg | DELAYED_RELEASE_TABLET | Freq: Every day | ORAL | Status: DC
  Administered 2011-07-30 – 2011-07-31 (×2): 81 mg via ORAL
  Filled 2011-07-30 (×2): qty 1

## 2011-07-30 NOTE — Progress Notes (Signed)
   CARDIOTHORACIC SURGERY PROGRESS NOTE  1 Day Post-Op  S/P Procedure(s) (LRB): BILATERAL UPPER EXTREMITY ANGIOGRAM (N/A)  Subjective: Mr Creque is doing very well.  No complaints at all.  Objective: Vital signs in last 24 hours: Temp:  [98.1 F (36.7 C)-98.7 F (37.1 C)] 98.6 F (37 C) (06/19 1419) Pulse Rate:  [53-88] 67  (06/19 1419) Cardiac Rhythm:  [-] Normal sinus rhythm (06/19 1500) Resp:  [18-25] 20  (06/19 1419) BP: (103-120)/(50-59) 103/59 mmHg (06/19 1419) SpO2:  [94 %-97 %] 94 % (06/19 1419)  Physical Exam:  Rhythm:   sinus  Breath sounds: clear  Heart sounds:  RRR w/ systolic murmur  Incisions:  n/a  Abdomen:  soft  Extremities:  warm   Intake/Output from previous day: 06/18 0701 - 06/19 0700 In: 300 [I.V.:300] Out: 725 [Urine:725] Intake/Output this shift:    Lab Results:  Basename 07/30/11 0355 07/29/11 2229  WBC 7.2 8.7  HGB 12.2* 11.9*  HCT 33.7* 33.5*  PLT 128* 127*   BMET:  Basename 07/30/11 0355 07/29/11 1432  NA 137 139  K 4.0 4.5  CL 104 105  CO2 24 --  GLUCOSE 104* 83  BUN 19 20  CREATININE 1.00 1.00  CALCIUM 8.5 --    CBG (last 3)  No results found for this basename: GLUCAP:3 in the last 72 hours PT/INR:  No results found for this basename: LABPROT,INR in the last 72 hours  CXR:  N/A  Assessment/Plan:  Mr Damron is well known to me from recent office consultation visits.  He has done exceptionally well with PTA of left subclavian artery.  I have discussed at length the relative risks and benefits of redo CABG + AVR with Mr Kauth and his wife, as well as the possibility of PCI of SVG to RCA system followed by TAVR.  Risks associated with redo CABG + AVR would be quite high, particularly due to the severity of atherosclerotic disease involving the thoracic aorta.  TAVR would likely have to be done via transapical or direct aortic approach.  Mr Davisson is interested in considering TAVR if it would be feasible.  I would favor  referral to a program with an existing TAVR program prior to PCI of SVG, as I would consider him a candidate for high risk redo CABG + AVR if TAVR were not feasible for some reason.  I would be happy to facilitate the referral myself if desired.   Braddock Servellon H 07/30/2011 8:06 PM

## 2011-07-30 NOTE — Progress Notes (Signed)
Utilization review completed.  

## 2011-07-30 NOTE — Progress Notes (Signed)
Pt to be transferred to 2008.  Report called to receiving RN, Shanda Bumps.  Pt's wife, Kendal Hymen, notified of new room number. Pt transferred to 2008 via wheelchair with all belongings.  Roselie Awkward, RN

## 2011-07-30 NOTE — Care Management Note (Signed)
    Page 1 of 1   07/30/2011     10:55:03 AM   CARE MANAGEMENT NOTE 07/30/2011  Patient:  Luis Townsend, Luis Townsend   Account Number:  [id number]  Date Initiated:  07/30/2011  Documentation initiated by:  Donn Pierini  Subjective/Objective Assessment:   Pt admitted s/p LSCA PTA and Stent     Action/Plan:   PTA pt lived at home with spouse,   Anticipated DC Date:  07/31/2011   Anticipated DC Plan:  HOME/SELF CARE      DC Planning Services  CM consult      Choice offered to / List presented to:             Status of service:  In process, will continue to follow Medicare Important Message given?   (If response is "NO", the following Medicare IM given date fields will be blank) Date Medicare IM given:   Date Additional Medicare IM given:    Discharge Disposition:    Per UR Regulation:  Reviewed for med. necessity/level of care/duration of stay  If discussed at Long Length of Stay Meetings, dates discussed:    Comments:  PCP- Hopper  07/30/11- 1050- Donn Pierini RN, BSN [phone] UR completed, pt to tx to tele today, no anticipated d/c needs

## 2011-07-30 NOTE — Progress Notes (Signed)
Subjective:  No CP/SOB  Objective:  Temp:  [97.6 F (36.4 C)-98.7 F (37.1 C)] 98.7 F (37.1 C) (06/19 0814) Pulse Rate:  [46-88] 88  (06/19 0800) Resp:  [17-25] 25  (06/19 0800) BP: (90-125)/(50-68) 112/50 mmHg (06/19 0800) SpO2:  [94 %-97 %] 97 % (06/19 0800) Weight:  [70.761 kg (156 lb)] 70.761 kg (156 lb) (06/18 1305) Weight change:   Intake/Output from previous day: 06/18 0701 - 06/19 0700 In: 300 [I.V.:300] Out: 725 [Urine:725]  Intake/Output from this shift: Total I/O In: 240 [P.O.:240] Out: -   Physical Exam: General appearance: alert, cooperative, appears stated age and no distress Neck: no adenopathy, no carotid bruit, no JVD, supple, symmetrical, trachea midline and thyroid not enlarged, symmetric, no tenderness/mass/nodules Lungs: clear to auscultation bilaterally Heart: typical SEM murmur of AS Extremities: Both groins bandaged and look good  Lab Results: Results for orders placed during the hospital encounter of 07/29/11 (from the past 48 hour(s))  POCT I-STAT, CHEM 8     Status: Abnormal   Collection Time   07/29/11  2:32 PM      Component Value Range Comment   Sodium 139  135 - 145 mEq/L    Potassium 4.5  3.5 - 5.1 mEq/L    Chloride 105  96 - 112 mEq/L    BUN 20  6 - 23 mg/dL    Creatinine, Ser 9.14  0.50 - 1.35 mg/dL    Glucose, Bld 83  70 - 99 mg/dL    Calcium, Ion 7.82  9.56 - 1.32 mmol/L    TCO2 21  0 - 100 mmol/L    Hemoglobin 12.6 (*) 13.0 - 17.0 g/dL    HCT 21.3 (*) 08.6 - 52.0 %   POCT ACTIVATED CLOTTING TIME     Status: Normal   Collection Time   07/29/11  3:31 PM      Component Value Range Comment   Activated Clotting Time 214     POCT ACTIVATED CLOTTING TIME     Status: Normal   Collection Time   07/29/11  3:50 PM      Component Value Range Comment   Activated Clotting Time 204     POCT ACTIVATED CLOTTING TIME     Status: Normal   Collection Time   07/29/11  4:47 PM      Component Value Range Comment   Activated Clotting Time 185      POCT ACTIVATED CLOTTING TIME     Status: Normal   Collection Time   07/29/11  5:07 PM      Component Value Range Comment   Activated Clotting Time 169     CBC     Status: Abnormal   Collection Time   07/29/11 10:29 PM      Component Value Range Comment   WBC 8.7  4.0 - 10.5 K/uL    RBC 3.63 (*) 4.22 - 5.81 MIL/uL    Hemoglobin 11.9 (*) 13.0 - 17.0 g/dL    HCT 57.8 (*) 46.9 - 52.0 %    MCV 92.3  78.0 - 100.0 fL    MCH 32.8  26.0 - 34.0 pg    MCHC 35.5  30.0 - 36.0 g/dL    RDW 62.9  52.8 - 41.3 %    Platelets 127 (*) 150 - 400 K/uL   BASIC METABOLIC PANEL     Status: Abnormal   Collection Time   07/30/11  3:55 AM      Component Value Range Comment  Sodium 137  135 - 145 mEq/L    Potassium 4.0  3.5 - 5.1 mEq/L    Chloride 104  96 - 112 mEq/L    CO2 24  19 - 32 mEq/L    Glucose, Bld 104 (*) 70 - 99 mg/dL    BUN 19  6 - 23 mg/dL    Creatinine, Ser 2.95  0.50 - 1.35 mg/dL    Calcium 8.5  8.4 - 62.1 mg/dL    GFR calc non Af Amer 68 (*) >90 mL/min    GFR calc Af Amer 79 (*) >90 mL/min   CBC     Status: Abnormal   Collection Time   07/30/11  3:55 AM      Component Value Range Comment   WBC 7.2  4.0 - 10.5 K/uL    RBC 3.67 (*) 4.22 - 5.81 MIL/uL    Hemoglobin 12.2 (*) 13.0 - 17.0 g/dL    HCT 30.8 (*) 65.7 - 52.0 %    MCV 91.8  78.0 - 100.0 fL    MCH 33.2  26.0 - 34.0 pg    MCHC 36.2 (*) 30.0 - 36.0 g/dL    RDW 84.6  96.2 - 95.2 %    Platelets 128 (*) 150 - 400 K/uL     Imaging: Imaging results have been reviewed  Assessment/Plan:   1. Principal Problem: 2.  *Subclavian artery stenosis, left, pta and stent 07/29/11 3. Active Problems: 4.  Coronary atherosclerosis of native coronary artery 5.  Aortic stenosis, critical AS with need for TAVR 6.  Hypertension 7.  Hyperlipidemia 8.  S/P CABG x 5, stenosis to RCA VG 9.  COPD (chronic obstructive pulmonary disease), moderat to severe 10.   Time Spent Directly with Patient:  20 minutes  Length of Stay:  LOS: 1 day    Looks great!!! S/P LSCA PTA and Stent for LIMA steal. He has a severely diseased RCA SVG that will require intervention as well as critical AS. He is a candidate for TAVR via the apical approach and has already been evaluated by Dr. Ashley Mariner. Labs OK. Will transfer to tele today and ambulate. Home tomorrow AM.  Runell Gess 07/30/2011, 9:56 AM

## 2011-07-31 DIAGNOSIS — I359 Nonrheumatic aortic valve disorder, unspecified: Secondary | ICD-10-CM | POA: Diagnosis not present

## 2011-07-31 DIAGNOSIS — I2510 Atherosclerotic heart disease of native coronary artery without angina pectoris: Secondary | ICD-10-CM | POA: Diagnosis not present

## 2011-07-31 DIAGNOSIS — G458 Other transient cerebral ischemic attacks and related syndromes: Secondary | ICD-10-CM | POA: Diagnosis not present

## 2011-07-31 NOTE — Discharge Summary (Signed)
Physician Discharge Summary  Patient ID: Luis Townsend MRN: [id number] DOB/AGE: [birth year] 78 y.o.  Admit date: 07/29/2011 Discharge date: 07/31/2011  Admission Diagnoses:  Left subclavian stenosis  Discharge Diagnoses:  Principal Problem:  *Subclavian artery stenosis, left, pta and stent 07/29/11 Active Problems:  Bradycardia  Aortic stenosis, critical AS with need for TAVR  Hypertension  Hyperlipidemia  S/P CABG x 5, stenosis to RCA VG 1995  Cerebrovascular disease  S/P carotid endarterectomy  Aortoiliac occlusive disease  COPD (chronic obstructive pulmonary disease), moderat to severe   Discharged Condition: stable  Hospital Course:   The patient is an 78 year old Caucasian male with history of coronary artery disease and severe subaortic stenosis, bilateral subclavian and carotid disease, chronic right bundle branch block, tobacco abuse (quit 30 days ago). Patient underwent coronary artery bypass grafting in 1995. Last echocardiogram was 06/10/2011 showed progression of his aortic stenosis with a CW Doppler gradient of 5.1 m/s AVA of 0.59 cm with good LV function. Patient recently underwent right and left heart catheterizations on the 14th 2013 this revealed critical aortic stenosis(aortic valve area was 0.54 cm), elevated LVEDP without increase in left atrial mean pressure of PA pressure arrest. As well as severe native and graft coronary disease. Recommendation was for percutaneous angioplasty and stenting of left subclavian artery as well as high risk replacement of aortic valve biological prosthesis and redo CABG with preservation of the LIMA bypass to the new SVG to PDA and OM.   The patient presented for left subclavian stenting.  Stenting was postponed and CT of the head and neck was ordered in order to define the posterior circulation.  After reviewing the results(see below), the patient was taken for successful PTA and stent of left subclavian artery(See cath results below).   Evaluation completed by Dr. Cornelius Moras who prefers referral to an existing TAVR program.  The patient had sinus bradycardia with first degree AV block with HR in the 40's when sleeping.  Limited cardiac meds due bradycardia and borderline hypotension(see list below).  The patient was seen by Dr. Herbie Baltimore and felt stable for discharge home.  Out patient considerations/planning:  1.  PCI to SVG and TAVR   or  2.  Consider redo CABG and AVR if prior is not feasible.     Consults: CTS  Significant Diagnostic Studies:  PV Intervention HEMODYNAMICS:  AO SYSTOLIC/AO DIASTOLIC: 119/68  IMPRESSION:successful PTA and stent of left subclavian artery the patient with an internal mammary artery graft to the LAD, critical aortic stenosis requiring TAVR. The patient will probably require his RCA vein graft to be percutaneously revascularized as well. The sheath was then secured in place the patient left unstable condition. Was hydrated overnight. She is to be removed and partial below the groin to achieve hemostasis. He will go any step down unit and 2900 for observation.   CT ANGIOGRAPHY HEAD AND NECK  Technique: Multidetector CT imaging of the head and neck was  performed using the standard protocol during bolus administration  of intravenous contrast. Multiplanar CT image reconstructions  including MIPs were obtained to evaluate the vascular anatomy.  Carotid stenosis measurements (when applicable) are obtained  utilizing NASCET criteria, using the distal internal carotid  diameter as the denominator.  Contrast: 100 mL OMNIPAQUE IOHEXOL 350 MG/ML SOLN  Comparison: None.  CTA NECK  Findings: Diffuse atherosclerotic disease involving the aortic  arch and proximal great vessels. The innominate artery is patent  with diffuse atherosclerotic disease. There is a 50% diameter  stenosis of  the origin of the left vertebral artery with calcified  plaque. There is calcified plaque involving the proximal left    subclavian artery. There is a 60% diameter stenosis of the left  subclavian artery just proximal to the left vertebral origin. Left  vertebral artery origin is narrowed by approximately 50% diameter  stenosis. There is some post stenotic dilatation and then the  vertebral is a very small vessel and extends to the basilar. Left  vertebral artery is patent to the basilar and left PICA is patent.  Approximate 1 cm distal to the stenosis, the left internal mammary  artery origin is present and is widely patent.  Right carotid: Heavily calcified plaque of the right carotid  bifurcation. The internal carotid artery lumen is narrowed by 75%  diameter stenosis. Right external carotid artery is patent. Right  internal carotid artery is patent to the skull base.  Left carotid: 50% diameter stenosis due to calcified plaque at the  origin. The left common carotid artery is patent. There has  been prior left carotid endarterectomy. There is a noncalcified  stenosis at the distal margin of the carotid endarterectomy  compatible with intimal hyperplasia. This stenosis measures 60%  diameter stenosis. Left internal carotid artery is patent to the  skull base.  Vertebral arteries: Right vertebral artery is dominant and widely  patent to the basilar without stenosis. Left vertebral artery  origin is 50% stenotic with post stenotic dilatation. The left  vertebral artery is a small vessel but is patent to the basilar.  Left PICA is patent.  Review of the MIP images confirms the above findings.  IMPRESSION:  Diffuse atherosclerotic disease, severe  60% diameter stenosis of the proximal left subclavian artery. Just  distal this is a left vertebral artery which shows poststenotic  dilatation. Left vertebral artery is a small vessel but is  patent to the basilar. Left internal mammary artery is patent and  has origin distal to the stenosis in the left subclavian artery.  The right vertebral artery is  dominant and widely patent.  75% diameter stenosis due to calcified plaque involving the  proximal right internal carotid artery.  Prior left carotid endarterectomy with a 60% diameter stenosis at  the distal margin of the endarterectomy site.  CTA HEAD  Findings: Age appropriate atrophy. Mild chronic microvascular  ischemia in the white matter. No acute infarct or mass. No  enhancing lesions are seen post contrast.  Atherosclerotic calcification is present throughout the cavernous  carotid artery with mild stenosis bilaterally. Left anterior and  middle cerebral arteries are widely patent without branch occlusion  or significant proximal stenosis.  Both vertebral arteries contribute to the basilar, the right  vertebral artery is dominant. PICA is patent bilaterally. The  basilar, superior cerebellar, and posterior cerebral arteries are  patent without significant stenosis.  Negative for cerebral aneurysm.  Review of the MIP images confirms the above findings.  IMPRESSION:  Both vertebral arteries contribute to the basilar. PICA is patent  bilaterally. Right vertebral artery is dominant.  Atherosclerotic disease throughout the cavernous carotid  bilaterally with mild cavernous carotid stenosis.  CBC    Component Value Date/Time   WBC 7.2 07/30/2011 0355   RBC 3.67* 07/30/2011 0355   HGB 12.2* 07/30/2011 0355   HCT 33.7* 07/30/2011 0355   PLT 128* 07/30/2011 0355   MCV 91.8 07/30/2011 0355   MCH 33.2 07/30/2011 0355   MCHC 36.2* 07/30/2011 0355   RDW 13.0 07/30/2011 0355    BMET  Component Value Date/Time   NA 137 07/30/2011 0355   K 4.0 07/30/2011 0355   CL 104 07/30/2011 0355   CO2 24 07/30/2011 0355   GLUCOSE 104* 07/30/2011 0355   BUN 19 07/30/2011 0355   CREATININE 1.00 07/30/2011 0355   CALCIUM 8.5 07/30/2011 0355   GFRNONAA 68* 07/30/2011 0355   GFRAA 79* 07/30/2011 0355    Treatments: PTA and stent to left subclavian  Discharge Exam: Blood pressure 102/59, pulse 66,  temperature 98 F (36.7 C), temperature source Oral, resp. rate 18, height 5\' 6"  (1.676 m), weight 70.761 kg (156 lb), SpO2 64.00%.   Disposition: 06-Home-Health Care Svc  Discharge Orders    Future Orders Please Complete By Expires   Diet - low sodium heart healthy      Increase activity slowly      Discharge instructions      Comments:   No lifting greater than a gallon of milk.     Medication List  As of 07/31/2011 12:42 PM   STOP taking these medications         ramipril 10 MG capsule         TAKE these medications         aspirin EC 81 MG tablet   Take 81 mg by mouth daily.      cholecalciferol 1000 UNITS tablet   Commonly known as: VITAMIN D   Take 1,000 Units by mouth daily.      fish oil-omega-3 fatty acids 1000 MG capsule   Take 1 g by mouth daily.      rosuvastatin 20 MG tablet   Commonly known as: CRESTOR   Take 40 mg by mouth every evening.             SignedWilburt Finlay 07/31/2011, 12:42 PM  I have seen & examined the patient this AM. I agree with Mr. Jasper Riling summary above. .  Feels better now, no further discomfort.  He is stable for d/c with ROV with DR. RAW next week.  Will need planning of staged PCI to SVG then apical approach TAVI.  Will hold ACE-I on d/c - can re-start as OP.  Marykay Lex, M.D., M.S. [facility] [address] [place], [place]  [address]  [phone] Pager # [phone]  07/31/2011 2:02 PM

## 2011-07-31 NOTE — Progress Notes (Signed)
.   Subjective:  Pt became very "weak" with vague chest pressure after a coughing episode this am.  Objective:  Vital Signs in the last 24 hours: Temp:  [98 F (36.7 C)-98.6 F (37 C)] 98 F (36.7 C) (06/20 0358) Pulse Rate:  [57-67] 66  (06/20 0358) Resp:  [18-20] 18  (06/20 0358) BP: (102-124)/(50-71) 102/59 mmHg (06/20 0358) SpO2:  [64 %-95 %] 64 % (06/20 0358)  Intake/Output from previous day:  Intake/Output Summary (Last 24 hours) at 07/31/11 1008 Last data filed at 07/30/11 1200  Gross per 24 hour  Intake    240 ml  Output    375 ml  Net   -135 ml    Physical Exam: General appearance: alert, cooperative and no distress Lungs: decreased breath sounds Heart: regular rate and rhythm and 2/6 systolic murmur   Rate: 48-77  Rhythm: normal sinus rhythm and sinus bradycardia  Lab Results:  Basename 07/30/11 0355 07/29/11 2229  WBC 7.2 8.7  HGB 12.2* 11.9*  PLT 128* 127*    Basename 07/30/11 0355 07/29/11 1432  NA 137 139  K 4.0 4.5  CL 104 105  CO2 24 --  GLUCOSE 104* 83  BUN 19 20  CREATININE 1.00 1.00   No results found for this basename: TROPONINI:2,CK,MB:2 in the last 72 hours Hepatic Function Panel No results found for this basename: PROT,ALBUMIN,AST,ALT,ALKPHOS,BILITOT,BILIDIR,IBILI in the last 72 hours No results found for this basename: CHOL in the last 72 hours No results found for this basename: INR in the last 72 hours  Imaging: Imaging results have been reviewed  Cardiac Studies:  Assessment/Plan:   Principal Problem:  *Subclavian artery stenosis, left, pta and stent 07/29/11 Active Problems:  Aortic stenosis, critical AS with need for TAVR  S/P CABG x 5, stenosis to RCA VG 1995  Bradycardia  Hypertension  Hyperlipidemia  Cerebrovascular disease  S/P carotid endarterectomy  Aortoiliac occlusive disease  COPD (chronic obstructive pulmonary disease), moderat to severe  Plan-He is on no cardiac meds except ASA, fish oil, and statin. I  reviewed telemetry strips, sinus bradycardia (in mid 40s) and varying first degree AVB when sleeping but no arrythmia or bradycardia recorded with his coughing spell this am. ? Discharge later today.  Corine Shelter PA-C 07/31/2011, 10:08 AM  I have seen & examined the patient this AM.  I agree with Mr. Wynelle Link findings, exam & assessment/recommendations.  Feels better now, no further discomfort.  I personally reviewed the telemetry & agree with Mr. Wynelle Link assesment.  He is stable for d/c with ROV with DR. RAW next week. Will need planning of staged PCI to SVG then apical approach TAVI.  Will hold ACE-I on d/c - can re-start as OP.  Marykay Lex, M.D., M.S. [facility] [address] [place], [place]  [address]  [phone] Pager # [phone]  07/31/2011 12:17 PM

## 2011-07-31 NOTE — Progress Notes (Signed)
Discharge instructions and patient eduction complete. IV site d.c. Site WNL. No further questions. D/C home with wife. Dion Saucier

## 2011-08-05 ENCOUNTER — Telehealth: Payer: Self-pay | Admitting: Thoracic Surgery (Cardiothoracic Vascular Surgery)

## 2011-08-05 DIAGNOSIS — I4510 Unspecified right bundle-branch block: Secondary | ICD-10-CM | POA: Diagnosis not present

## 2011-08-05 DIAGNOSIS — I2699 Other pulmonary embolism without acute cor pulmonale: Secondary | ICD-10-CM | POA: Diagnosis not present

## 2011-08-05 DIAGNOSIS — I2510 Atherosclerotic heart disease of native coronary artery without angina pectoris: Secondary | ICD-10-CM | POA: Diagnosis not present

## 2011-08-05 NOTE — Telephone Encounter (Signed)
I spoke with Dr Italy Hughes at [facility] over the telephone regarding Mr Hausmann case.  We plan to send Mr Bedoya to see Dr Kizzie Bane to consider TAVR via transapical or transaortic approach.  I telephoned Mr Gilbert and left a message for him regarding this referral.  We will send Mr Stockinger most recent ECHO, cath, left subclavian PTA and cardiac CTA films ahead of time for review.  Essie Gehret H

## 2011-08-27 DIAGNOSIS — I359 Nonrheumatic aortic valve disorder, unspecified: Secondary | ICD-10-CM | POA: Diagnosis not present

## 2011-08-27 DIAGNOSIS — I350 Nonrheumatic aortic (valve) stenosis: Secondary | ICD-10-CM | POA: Insufficient documentation

## 2011-08-27 DIAGNOSIS — I2510 Atherosclerotic heart disease of native coronary artery without angina pectoris: Secondary | ICD-10-CM | POA: Insufficient documentation

## 2011-08-27 DIAGNOSIS — E785 Hyperlipidemia, unspecified: Secondary | ICD-10-CM | POA: Diagnosis not present

## 2011-08-27 DIAGNOSIS — I739 Peripheral vascular disease, unspecified: Secondary | ICD-10-CM | POA: Insufficient documentation

## 2011-08-27 DIAGNOSIS — Z79899 Other long term (current) drug therapy: Secondary | ICD-10-CM | POA: Diagnosis not present

## 2011-08-27 DIAGNOSIS — I10 Essential (primary) hypertension: Secondary | ICD-10-CM | POA: Diagnosis not present

## 2011-09-29 ENCOUNTER — Encounter: Payer: Self-pay | Admitting: Thoracic Surgery (Cardiothoracic Vascular Surgery)

## 2011-09-29 ENCOUNTER — Ambulatory Visit (INDEPENDENT_AMBULATORY_CARE_PROVIDER_SITE_OTHER): Payer: Medicare Other | Admitting: Thoracic Surgery (Cardiothoracic Vascular Surgery)

## 2011-09-29 ENCOUNTER — Ambulatory Visit: Payer: Medicare Other | Admitting: Thoracic Surgery (Cardiothoracic Vascular Surgery)

## 2011-09-29 VITALS — BP 161/67 | HR 51 | Resp 16 | Ht 66.0 in | Wt 157.0 lb

## 2011-09-29 DIAGNOSIS — I2510 Atherosclerotic heart disease of native coronary artery without angina pectoris: Secondary | ICD-10-CM

## 2011-09-29 DIAGNOSIS — I359 Nonrheumatic aortic valve disorder, unspecified: Secondary | ICD-10-CM | POA: Diagnosis not present

## 2011-09-29 DIAGNOSIS — I350 Nonrheumatic aortic (valve) stenosis: Secondary | ICD-10-CM | POA: Insufficient documentation

## 2011-09-29 NOTE — Progress Notes (Signed)
[address]            [place] [address]          [phone]     CARDIOTHORACIC SURGERY OFFICE NOTE  Referring Provider is Governor Rooks, MD PCP is Marga Melnick, MD   HPI:  Patient returns for further followup of severe symptomatic aortic stenosis.  He was originally seen in consultation on 06/30/2011. Since then he underwent angioplasty and stenting of high grade left subclavian artery stenosis by Dr. Nanetta Batty on 07/29/2011.  He notes that since he underwent this procedure he has been feeling better with decreased shortness of breath improved exercise tolerance. He has also been seen in consultation by Dr. Kizzie Bane at [facility] for possible transcatheter aortic valve replacement (TAVR). He is contemplating proceeding with TAVR but he wanted to come back and review the risks and benefits of conventional aortic valve replacement with redo coronary artery bypass grafting before making a final decision.  He has stable mild exertional shortness of breath. The remainder of his review of systems is unchanged from previously.   Current Outpatient Prescriptions  Medication Sig Dispense Refill  . aspirin EC 81 MG tablet Take 81 mg by mouth daily.      . cholecalciferol (VITAMIN D) 1000 UNITS tablet Take 1,000 Units by mouth daily.      . fish oil-omega-3 fatty acids 1000 MG capsule Take 1 g by mouth daily.      . rosuvastatin (CRESTOR) 20 MG tablet Take 40 mg by mouth every evening.           Physical Exam:   BP 161/67  Pulse 51  Resp 16  Ht 5\' 6"  (1.676 m)  Wt 157 lb (71.215 kg)  BMI 25.34 kg/m2  SpO2 96%  General:  Well-appearing  Chest:   Clear to auscultation with symmetrical breath sounds  CV:   Regular rate and rhythm with prominent systolic murmur  Incisions:  n/a  Abdomen:  Soft and nontender  Extremities:  Warm and well-perfused  Diagnostic Tests:  n/a   Impression:  The patient has severe  symptomatic aortic stenosis with preserved left ventricular systolic function and three-vessel coronary artery disease with vein graft disease status post coronary artery bypass grafting in the distant past. Risks associated with conventional aortic valve replacement and redo coronary artery bypass grafting are likely not prohibitive but would be substantially increased due to to the presence of severe disease in the ascending thoracic aorta and severe peripheral vascular disease. Transcatheter aortic valve replacement would require either transapical or transaortic approach.   Plan:  The patient was counseled at length regarding treatment alternatives for management of severe symptomatic aortic stenosis. The high-risk nature of surgical intervention has been discussed in detail. Long-term prognosis with medical therapy was discussed. Alternative approaches such as conventional aortic valve replacement, transcatheter aortic valve replacement, and palliative medical therapy were compared and contrasted at length. This discussion was placed in the context of the patient's own specific clinical presentation and past medical history.  All of their questions been addressed.  The patient seems inclined to proceed with TAVR and will contact Dr. Kizzie Bane office in the near future to schedule surgery.  We would be happy to see him again at any time should further problems or questions arise.    Salvatore Decent. Cornelius Moras, MD 09/29/2011 12:10 PM

## 2011-11-06 DIAGNOSIS — I2510 Atherosclerotic heart disease of native coronary artery without angina pectoris: Secondary | ICD-10-CM | POA: Diagnosis not present

## 2011-11-06 DIAGNOSIS — E782 Mixed hyperlipidemia: Secondary | ICD-10-CM | POA: Diagnosis not present

## 2011-11-06 DIAGNOSIS — I359 Nonrheumatic aortic valve disorder, unspecified: Secondary | ICD-10-CM | POA: Diagnosis not present

## 2011-11-10 DIAGNOSIS — R300 Dysuria: Secondary | ICD-10-CM | POA: Diagnosis not present

## 2011-11-10 DIAGNOSIS — J90 Pleural effusion, not elsewhere classified: Secondary | ICD-10-CM | POA: Diagnosis not present

## 2011-11-10 DIAGNOSIS — D72829 Elevated white blood cell count, unspecified: Secondary | ICD-10-CM | POA: Diagnosis not present

## 2011-11-10 DIAGNOSIS — I25810 Atherosclerosis of coronary artery bypass graft(s) without angina pectoris: Secondary | ICD-10-CM | POA: Diagnosis present

## 2011-11-10 DIAGNOSIS — I442 Atrioventricular block, complete: Secondary | ICD-10-CM | POA: Diagnosis not present

## 2011-11-10 DIAGNOSIS — I359 Nonrheumatic aortic valve disorder, unspecified: Secondary | ICD-10-CM | POA: Diagnosis not present

## 2011-11-10 DIAGNOSIS — E785 Hyperlipidemia, unspecified: Secondary | ICD-10-CM | POA: Diagnosis present

## 2011-11-10 DIAGNOSIS — I739 Peripheral vascular disease, unspecified: Secondary | ICD-10-CM | POA: Diagnosis not present

## 2011-11-10 DIAGNOSIS — Z4682 Encounter for fitting and adjustment of non-vascular catheter: Secondary | ICD-10-CM | POA: Diagnosis not present

## 2011-11-10 DIAGNOSIS — I4430 Unspecified atrioventricular block: Secondary | ICD-10-CM | POA: Diagnosis not present

## 2011-11-10 DIAGNOSIS — Z006 Encounter for examination for normal comparison and control in clinical research program: Secondary | ICD-10-CM | POA: Diagnosis not present

## 2011-11-10 DIAGNOSIS — J9383 Other pneumothorax: Secondary | ICD-10-CM | POA: Diagnosis not present

## 2011-11-10 DIAGNOSIS — I679 Cerebrovascular disease, unspecified: Secondary | ICD-10-CM | POA: Diagnosis not present

## 2011-11-10 DIAGNOSIS — I10 Essential (primary) hypertension: Secondary | ICD-10-CM | POA: Diagnosis not present

## 2011-11-10 DIAGNOSIS — R918 Other nonspecific abnormal finding of lung field: Secondary | ICD-10-CM | POA: Diagnosis not present

## 2011-11-10 DIAGNOSIS — I2510 Atherosclerotic heart disease of native coronary artery without angina pectoris: Secondary | ICD-10-CM | POA: Diagnosis not present

## 2011-11-12 HISTORY — PX: PACEMAKER INSERTION: SHX728

## 2011-12-03 DIAGNOSIS — I359 Nonrheumatic aortic valve disorder, unspecified: Secondary | ICD-10-CM | POA: Diagnosis not present

## 2011-12-03 DIAGNOSIS — T8182XA Emphysema (subcutaneous) resulting from a procedure, initial encounter: Secondary | ICD-10-CM | POA: Diagnosis not present

## 2011-12-03 DIAGNOSIS — Z954 Presence of other heart-valve replacement: Secondary | ICD-10-CM | POA: Diagnosis not present

## 2011-12-03 DIAGNOSIS — Z48812 Encounter for surgical aftercare following surgery on the circulatory system: Secondary | ICD-10-CM | POA: Diagnosis not present

## 2011-12-14 DIAGNOSIS — H3530 Unspecified macular degeneration: Secondary | ICD-10-CM | POA: Diagnosis not present

## 2011-12-14 DIAGNOSIS — H2510 Age-related nuclear cataract, unspecified eye: Secondary | ICD-10-CM | POA: Diagnosis not present

## 2011-12-25 ENCOUNTER — Encounter (HOSPITAL_COMMUNITY)
Admission: RE | Admit: 2011-12-25 | Discharge: 2011-12-25 | Disposition: A | Discharge status: Home / Self Care | Payer: Medicare Other | Source: Ambulatory Visit | Attending: Cardiovascular Disease | Admitting: Cardiovascular Disease

## 2011-12-25 DIAGNOSIS — Z954 Presence of other heart-valve replacement: Secondary | ICD-10-CM | POA: Insufficient documentation

## 2011-12-25 DIAGNOSIS — Z5189 Encounter for other specified aftercare: Secondary | ICD-10-CM | POA: Insufficient documentation

## 2011-12-25 DIAGNOSIS — I359 Nonrheumatic aortic valve disorder, unspecified: Secondary | ICD-10-CM | POA: Insufficient documentation

## 2011-12-25 NOTE — Progress Notes (Signed)
Cardiac Rehab Medication Review by a Pharmacist  Does the patient  feel that his/her medications are working for him/her?  yes  Has the patient been experiencing any side effects to the medications prescribed?  no  Does the patient measure his/her own blood pressure or blood glucose at home?  yes encouraged patient to taking regularly  Does the patient have any problems obtaining medications due to transportation or finances?   no  Understanding of regimen: good Understanding of indications: good Potential of compliance: good     Luis Townsend 12/25/2011 9:01 AM

## 2011-12-29 ENCOUNTER — Encounter (HOSPITAL_COMMUNITY): Payer: Medicare Other

## 2011-12-29 ENCOUNTER — Encounter (HOSPITAL_COMMUNITY)
Admission: RE | Admit: 2011-12-29 | Discharge: 2011-12-29 | Disposition: A | Discharge status: Home / Self Care | Payer: Medicare Other | Source: Ambulatory Visit | Attending: Cardiovascular Disease | Admitting: Cardiovascular Disease

## 2011-12-29 DIAGNOSIS — Z954 Presence of other heart-valve replacement: Secondary | ICD-10-CM | POA: Diagnosis not present

## 2011-12-29 DIAGNOSIS — Z5189 Encounter for other specified aftercare: Secondary | ICD-10-CM | POA: Diagnosis not present

## 2011-12-29 DIAGNOSIS — I359 Nonrheumatic aortic valve disorder, unspecified: Secondary | ICD-10-CM | POA: Diagnosis not present

## 2011-12-29 NOTE — Progress Notes (Signed)
Pt started cardiac rehab today.  Pt tolerated light exercise without difficulty. Telemetry rhythm AV Paced.  Vital signs stable. Will continue to monitor the patient throughout  the program.

## 2011-12-30 DIAGNOSIS — Z23 Encounter for immunization: Secondary | ICD-10-CM | POA: Diagnosis not present

## 2011-12-31 ENCOUNTER — Encounter (HOSPITAL_COMMUNITY)
Admission: RE | Admit: 2011-12-31 | Discharge: 2011-12-31 | Disposition: A | Discharge status: Home / Self Care | Payer: Medicare Other | Source: Ambulatory Visit | Attending: Cardiovascular Disease | Admitting: Cardiovascular Disease

## 2011-12-31 NOTE — Progress Notes (Signed)
Ed's post exercise blood pressure 92/60.  Patient asymptomatic.  Patient given H20 repeat blood pressure 95/67.  Patient given H20 and graham crackers.  Repeat blood pressure 124/60.  Encouraged Ed to bring water to drink at his next exercise session. Patient states understanding.

## 2012-01-02 ENCOUNTER — Encounter (HOSPITAL_COMMUNITY)
Admission: RE | Admit: 2012-01-02 | Discharge: 2012-01-02 | Disposition: A | Discharge status: Home / Self Care | Payer: Medicare Other | Source: Ambulatory Visit | Attending: Cardiovascular Disease | Admitting: Cardiovascular Disease

## 2012-01-05 ENCOUNTER — Encounter (HOSPITAL_COMMUNITY)
Admission: RE | Admit: 2012-01-05 | Discharge: 2012-01-05 | Disposition: A | Discharge status: Home / Self Care | Payer: Medicare Other | Source: Ambulatory Visit | Attending: Cardiovascular Disease | Admitting: Cardiovascular Disease

## 2012-01-07 ENCOUNTER — Encounter (HOSPITAL_COMMUNITY)
Admission: RE | Admit: 2012-01-07 | Discharge: 2012-01-07 | Disposition: A | Discharge status: Home / Self Care | Payer: Medicare Other | Source: Ambulatory Visit | Attending: Cardiovascular Disease | Admitting: Cardiovascular Disease

## 2012-01-09 ENCOUNTER — Encounter (HOSPITAL_COMMUNITY): Payer: Medicare Other

## 2012-01-12 ENCOUNTER — Encounter (HOSPITAL_COMMUNITY)
Admission: RE | Admit: 2012-01-12 | Discharge: 2012-01-12 | Disposition: A | Discharge status: Home / Self Care | Payer: Medicare Other | Source: Ambulatory Visit | Attending: Cardiovascular Disease | Admitting: Cardiovascular Disease

## 2012-01-12 DIAGNOSIS — Z954 Presence of other heart-valve replacement: Secondary | ICD-10-CM | POA: Insufficient documentation

## 2012-01-12 DIAGNOSIS — I359 Nonrheumatic aortic valve disorder, unspecified: Secondary | ICD-10-CM | POA: Diagnosis not present

## 2012-01-12 DIAGNOSIS — Z5189 Encounter for other specified aftercare: Secondary | ICD-10-CM | POA: Diagnosis not present

## 2012-01-14 ENCOUNTER — Encounter (HOSPITAL_COMMUNITY)
Admission: RE | Admit: 2012-01-14 | Discharge: 2012-01-14 | Disposition: A | Discharge status: Home / Self Care | Payer: Medicare Other | Source: Ambulatory Visit | Attending: Cardiovascular Disease | Admitting: Cardiovascular Disease

## 2012-01-14 NOTE — Progress Notes (Signed)
Reviewed home exercise with pt today.  Pt plans to walk at home for exercise.  Reviewed THR, pulse, RPE, sign and symptoms, and when to call 911 or MD.  Pt voiced understanding. Kashira Behunin, MA, ACSM RCEP   

## 2012-01-16 ENCOUNTER — Encounter (HOSPITAL_COMMUNITY)
Admission: RE | Admit: 2012-01-16 | Discharge: 2012-01-16 | Disposition: A | Discharge status: Home / Self Care | Payer: Medicare Other | Source: Ambulatory Visit | Attending: Cardiovascular Disease | Admitting: Cardiovascular Disease

## 2012-01-19 ENCOUNTER — Encounter (HOSPITAL_COMMUNITY)
Admission: RE | Admit: 2012-01-19 | Discharge: 2012-01-19 | Disposition: A | Discharge status: Home / Self Care | Payer: Medicare Other | Source: Ambulatory Visit | Attending: Cardiovascular Disease | Admitting: Cardiovascular Disease

## 2012-01-21 ENCOUNTER — Encounter (HOSPITAL_COMMUNITY)
Admission: RE | Admit: 2012-01-21 | Discharge: 2012-01-21 | Disposition: A | Discharge status: Home / Self Care | Payer: Medicare Other | Source: Ambulatory Visit | Attending: Cardiovascular Disease | Admitting: Cardiovascular Disease

## 2012-01-23 ENCOUNTER — Encounter (HOSPITAL_COMMUNITY)
Admission: RE | Admit: 2012-01-23 | Discharge: 2012-01-23 | Disposition: A | Discharge status: Home / Self Care | Payer: Medicare Other | Source: Ambulatory Visit | Attending: Cardiovascular Disease | Admitting: Cardiovascular Disease

## 2012-01-26 ENCOUNTER — Encounter (HOSPITAL_COMMUNITY)
Admission: RE | Admit: 2012-01-26 | Discharge: 2012-01-26 | Disposition: A | Discharge status: Home / Self Care | Payer: Medicare Other | Source: Ambulatory Visit | Attending: Cardiovascular Disease | Admitting: Cardiovascular Disease

## 2012-01-28 ENCOUNTER — Encounter (HOSPITAL_COMMUNITY)
Admission: RE | Admit: 2012-01-28 | Discharge: 2012-01-28 | Disposition: A | Discharge status: Home / Self Care | Payer: Medicare Other | Source: Ambulatory Visit | Attending: Cardiovascular Disease | Admitting: Cardiovascular Disease

## 2012-01-28 NOTE — Progress Notes (Signed)
Repeat blood pressure 104/70/ Will fax exercise flow sheets to Dr. Kandis Cocking office for review.

## 2012-01-28 NOTE — Progress Notes (Signed)
Repeat blood pressure 90/50 upon exit. Will continue to monitor the patient throughout  the program.

## 2012-01-28 NOTE — Progress Notes (Signed)
Pt cool down BP- 80/50.  Pt asymptomatic.  Feet elevated, given gatorade to drink.  Phone call to Luis Boozer, NP , who advised pt to hold pm coreg and altace today.  Pt has previously scheduled appt with Dr. Alanda Amass in am.  Pt reports he did not drink water today with exercise. Pt educated about importance of this.  Pt also reports appetite loss. Pt advised to discuss this with Dr. Alanda Amass tomorrow.  Pt verbalized understanding.

## 2012-01-29 DIAGNOSIS — R5381 Other malaise: Secondary | ICD-10-CM | POA: Diagnosis not present

## 2012-01-29 DIAGNOSIS — I509 Heart failure, unspecified: Secondary | ICD-10-CM | POA: Diagnosis not present

## 2012-01-29 DIAGNOSIS — R5383 Other fatigue: Secondary | ICD-10-CM | POA: Diagnosis not present

## 2012-01-29 DIAGNOSIS — Z45018 Encounter for adjustment and management of other part of cardiac pacemaker: Secondary | ICD-10-CM | POA: Diagnosis not present

## 2012-01-29 DIAGNOSIS — R6889 Other general symptoms and signs: Secondary | ICD-10-CM | POA: Diagnosis not present

## 2012-01-29 DIAGNOSIS — N429 Disorder of prostate, unspecified: Secondary | ICD-10-CM | POA: Diagnosis not present

## 2012-01-29 DIAGNOSIS — I2510 Atherosclerotic heart disease of native coronary artery without angina pectoris: Secondary | ICD-10-CM | POA: Diagnosis not present

## 2012-01-30 ENCOUNTER — Encounter (HOSPITAL_COMMUNITY)
Admission: RE | Admit: 2012-01-30 | Discharge: 2012-01-30 | Disposition: A | Discharge status: Home / Self Care | Payer: Medicare Other | Source: Ambulatory Visit | Attending: Cardiovascular Disease | Admitting: Cardiovascular Disease

## 2012-01-30 NOTE — Progress Notes (Signed)
Ed saw Dr Alanda Amass yesterday.  Mr Floren said his Altace was decreased to 5 mg a day.  Will continue to monitor blood pressure.

## 2012-02-02 ENCOUNTER — Encounter (HOSPITAL_COMMUNITY)
Admission: RE | Admit: 2012-02-02 | Discharge: 2012-02-02 | Disposition: A | Discharge status: Home / Self Care | Payer: Medicare Other | Source: Ambulatory Visit | Attending: Cardiovascular Disease | Admitting: Cardiovascular Disease

## 2012-02-06 ENCOUNTER — Encounter (HOSPITAL_COMMUNITY)
Admission: RE | Admit: 2012-02-06 | Discharge: 2012-02-06 | Disposition: A | Discharge status: Home / Self Care | Payer: Medicare Other | Source: Ambulatory Visit | Attending: Cardiovascular Disease | Admitting: Cardiovascular Disease

## 2012-02-09 ENCOUNTER — Encounter (HOSPITAL_COMMUNITY)
Admission: RE | Admit: 2012-02-09 | Discharge: 2012-02-09 | Disposition: A | Discharge status: Home / Self Care | Payer: Medicare Other | Source: Ambulatory Visit | Attending: Cardiovascular Disease | Admitting: Cardiovascular Disease

## 2012-02-11 ENCOUNTER — Encounter (HOSPITAL_COMMUNITY): Payer: Medicare Other

## 2012-02-13 ENCOUNTER — Encounter (HOSPITAL_COMMUNITY): Payer: Medicare Other

## 2012-02-16 ENCOUNTER — Encounter (HOSPITAL_COMMUNITY)
Admission: RE | Admit: 2012-02-16 | Discharge: 2012-02-16 | Disposition: A | Discharge status: Home / Self Care | Payer: Medicare Other | Source: Ambulatory Visit | Attending: Cardiovascular Disease | Admitting: Cardiovascular Disease

## 2012-02-16 DIAGNOSIS — Z954 Presence of other heart-valve replacement: Secondary | ICD-10-CM | POA: Insufficient documentation

## 2012-02-16 DIAGNOSIS — Z5189 Encounter for other specified aftercare: Secondary | ICD-10-CM | POA: Insufficient documentation

## 2012-02-16 DIAGNOSIS — I359 Nonrheumatic aortic valve disorder, unspecified: Secondary | ICD-10-CM | POA: Insufficient documentation

## 2012-02-18 ENCOUNTER — Encounter (HOSPITAL_COMMUNITY)
Admission: RE | Admit: 2012-02-18 | Discharge: 2012-02-18 | Disposition: A | Discharge status: Home / Self Care | Payer: Medicare Other | Source: Ambulatory Visit | Attending: Cardiovascular Disease | Admitting: Cardiovascular Disease

## 2012-02-20 ENCOUNTER — Encounter (HOSPITAL_COMMUNITY)
Admission: RE | Admit: 2012-02-20 | Discharge: 2012-02-20 | Disposition: A | Discharge status: Home / Self Care | Payer: Medicare Other | Source: Ambulatory Visit | Attending: Cardiovascular Disease | Admitting: Cardiovascular Disease

## 2012-02-23 ENCOUNTER — Encounter (HOSPITAL_COMMUNITY): Payer: Medicare Other

## 2012-02-25 ENCOUNTER — Encounter (HOSPITAL_COMMUNITY)
Admission: RE | Admit: 2012-02-25 | Discharge: 2012-02-25 | Disposition: A | Discharge status: Home / Self Care | Payer: Medicare Other | Source: Ambulatory Visit | Attending: Cardiovascular Disease | Admitting: Cardiovascular Disease

## 2012-02-27 ENCOUNTER — Encounter (HOSPITAL_COMMUNITY)
Admission: RE | Admit: 2012-02-27 | Discharge: 2012-02-27 | Disposition: A | Discharge status: Home / Self Care | Payer: Medicare Other | Source: Ambulatory Visit | Attending: Cardiovascular Disease | Admitting: Cardiovascular Disease

## 2012-03-01 ENCOUNTER — Encounter (HOSPITAL_COMMUNITY)
Admission: RE | Admit: 2012-03-01 | Discharge: 2012-03-01 | Disposition: A | Discharge status: Home / Self Care | Payer: Medicare Other | Source: Ambulatory Visit | Attending: Cardiovascular Disease | Admitting: Cardiovascular Disease

## 2012-03-03 ENCOUNTER — Encounter (HOSPITAL_COMMUNITY)
Admission: RE | Admit: 2012-03-03 | Discharge: 2012-03-03 | Disposition: A | Discharge status: Home / Self Care | Payer: Medicare Other | Source: Ambulatory Visit | Attending: Cardiovascular Disease | Admitting: Cardiovascular Disease

## 2012-03-05 ENCOUNTER — Encounter (HOSPITAL_COMMUNITY)
Admission: RE | Admit: 2012-03-05 | Discharge: 2012-03-05 | Disposition: A | Discharge status: Home / Self Care | Payer: Medicare Other | Source: Ambulatory Visit | Attending: Cardiovascular Disease | Admitting: Cardiovascular Disease

## 2012-03-08 ENCOUNTER — Encounter (HOSPITAL_COMMUNITY)
Admission: RE | Admit: 2012-03-08 | Discharge: 2012-03-08 | Disposition: A | Discharge status: Home / Self Care | Payer: Medicare Other | Source: Ambulatory Visit | Attending: Cardiovascular Disease | Admitting: Cardiovascular Disease

## 2012-03-10 ENCOUNTER — Encounter (HOSPITAL_COMMUNITY): Payer: Medicare Other

## 2012-03-12 ENCOUNTER — Encounter (HOSPITAL_COMMUNITY)
Admission: RE | Admit: 2012-03-12 | Discharge: 2012-03-12 | Disposition: A | Discharge status: Home / Self Care | Payer: Medicare Other | Source: Ambulatory Visit | Attending: Cardiovascular Disease | Admitting: Cardiovascular Disease

## 2012-03-12 NOTE — Progress Notes (Signed)
Luis Townsend 79 y.o. male Nutrition Note Spoke with pt.  Nutrition Plan and Nutrition Survey reviewed with pt. Pt is following Step 2 of the Therapeutic Lifestyle Changes diet. Pt reports he has unintentionally lost 10-12 lbs after surgery. Pt states his UBW 156-160 lbs "but I'm comfortable with my weight now." Pt's current wt is down 3.3-7.3# from pt's reported UBWR. Per discussion with pt, pt sense of taste has decreased since his surgery. Donzetta Matters discussed as an OTC product that may help with pt's decreased taste sensation.  Pt expressed understanding of the above information reviewed. Pt aware of nutrition education classes offered and  plans on attending nutrition classes.  Nutrition Diagnosis   Food-and nutrition-related knowledge deficit related to lack of exposure to information as related to diagnosis of: ? CVD   Nutrition RX/ Estimated Daily Nutrition Needs for: wt maintenance 1850-2100 Kcal, 60-70 gm fat, 12-14 gm sat fat, 1.8-2.1 gm trans-fat, <1500 mg sodium   Nutrition Intervention   Pt's individual nutrition plan reviewed with pt.   Benefits of adopting Therapeutic Lifestyle Changes discussed when Medficts reviewed.   Pt to attend the Portion Distortion class   Pt to attend the  ? Nutrition I class                     ? Nutrition II class - met 02/17/12   Pt given handouts for: Nutrition class schedule   Continue client-centered nutrition education by RD, as part of interdisciplinary care. Goal(s)   Pt to describe the benefit of including fruits, vegetables, whole grains, and low-fat dairy products in a heart healthy meal plan. Monitor and Evaluate progress toward nutrition goal with team. Nutrition Risk: Change to Moderate

## 2012-03-15 ENCOUNTER — Encounter (HOSPITAL_COMMUNITY)
Admission: RE | Admit: 2012-03-15 | Discharge: 2012-03-15 | Disposition: A | Discharge status: Home / Self Care | Payer: Medicare Other | Source: Ambulatory Visit | Attending: Cardiovascular Disease | Admitting: Cardiovascular Disease

## 2012-03-15 DIAGNOSIS — Z954 Presence of other heart-valve replacement: Secondary | ICD-10-CM | POA: Diagnosis not present

## 2012-03-15 DIAGNOSIS — I359 Nonrheumatic aortic valve disorder, unspecified: Secondary | ICD-10-CM | POA: Diagnosis not present

## 2012-03-15 DIAGNOSIS — Z5189 Encounter for other specified aftercare: Secondary | ICD-10-CM | POA: Diagnosis not present

## 2012-03-17 ENCOUNTER — Encounter (HOSPITAL_COMMUNITY)
Admission: RE | Admit: 2012-03-17 | Discharge: 2012-03-17 | Disposition: A | Discharge status: Home / Self Care | Payer: Medicare Other | Source: Ambulatory Visit | Attending: Cardiovascular Disease | Admitting: Cardiovascular Disease

## 2012-03-19 ENCOUNTER — Encounter (HOSPITAL_COMMUNITY)
Admission: RE | Admit: 2012-03-19 | Discharge: 2012-03-19 | Disposition: A | Discharge status: Home / Self Care | Payer: Medicare Other | Source: Ambulatory Visit | Attending: Cardiovascular Disease | Admitting: Cardiovascular Disease

## 2012-03-22 ENCOUNTER — Encounter (HOSPITAL_COMMUNITY)
Admission: RE | Admit: 2012-03-22 | Discharge: 2012-03-22 | Disposition: A | Discharge status: Home / Self Care | Payer: Medicare Other | Source: Ambulatory Visit | Attending: Cardiovascular Disease | Admitting: Cardiovascular Disease

## 2012-03-24 ENCOUNTER — Encounter (HOSPITAL_COMMUNITY)
Admission: RE | Admit: 2012-03-24 | Discharge: 2012-03-24 | Disposition: A | Discharge status: Home / Self Care | Payer: Medicare Other | Source: Ambulatory Visit | Attending: Cardiovascular Disease | Admitting: Cardiovascular Disease

## 2012-03-26 ENCOUNTER — Encounter (HOSPITAL_COMMUNITY): Payer: Medicare Other

## 2012-03-29 ENCOUNTER — Encounter (HOSPITAL_COMMUNITY)
Admission: RE | Admit: 2012-03-29 | Discharge: 2012-03-29 | Disposition: A | Discharge status: Home / Self Care | Payer: Medicare Other | Source: Ambulatory Visit | Attending: Cardiovascular Disease | Admitting: Cardiovascular Disease

## 2012-03-31 ENCOUNTER — Encounter (HOSPITAL_COMMUNITY)
Admission: RE | Admit: 2012-03-31 | Discharge: 2012-03-31 | Disposition: A | Discharge status: Home / Self Care | Payer: Medicare Other | Source: Ambulatory Visit | Attending: Cardiovascular Disease | Admitting: Cardiovascular Disease

## 2012-04-02 ENCOUNTER — Encounter (HOSPITAL_COMMUNITY)
Admission: RE | Admit: 2012-04-02 | Discharge: 2012-04-02 | Disposition: A | Discharge status: Home / Self Care | Payer: Medicare Other | Source: Ambulatory Visit | Attending: Cardiovascular Disease | Admitting: Cardiovascular Disease

## 2012-04-05 ENCOUNTER — Encounter (HOSPITAL_COMMUNITY)
Admission: RE | Admit: 2012-04-05 | Discharge: 2012-04-05 | Disposition: A | Discharge status: Home / Self Care | Payer: Medicare Other | Source: Ambulatory Visit | Attending: Cardiovascular Disease | Admitting: Cardiovascular Disease

## 2012-04-05 NOTE — Progress Notes (Signed)
Ed graduates today.  Ed plans to continue exercise in the maintenance program.

## 2012-04-12 ENCOUNTER — Encounter (HOSPITAL_COMMUNITY)
Admission: RE | Admit: 2012-04-12 | Discharge: 2012-04-12 | Disposition: A | Discharge status: Home / Self Care | Payer: Self-pay | Source: Ambulatory Visit | Attending: Cardiovascular Disease | Admitting: Cardiovascular Disease

## 2012-04-12 DIAGNOSIS — I359 Nonrheumatic aortic valve disorder, unspecified: Secondary | ICD-10-CM | POA: Insufficient documentation

## 2012-04-12 DIAGNOSIS — Z954 Presence of other heart-valve replacement: Secondary | ICD-10-CM | POA: Insufficient documentation

## 2012-04-12 DIAGNOSIS — Z5189 Encounter for other specified aftercare: Secondary | ICD-10-CM | POA: Insufficient documentation

## 2012-04-12 NOTE — Progress Notes (Signed)
Oriented patient to the cardiac maintenance program. Pt tolerated exercise well without c/o.

## 2012-04-14 ENCOUNTER — Encounter (HOSPITAL_COMMUNITY)
Admission: RE | Admit: 2012-04-14 | Discharge: 2012-04-14 | Disposition: A | Discharge status: Home / Self Care | Payer: Self-pay | Source: Ambulatory Visit | Attending: Cardiovascular Disease | Admitting: Cardiovascular Disease

## 2012-04-16 ENCOUNTER — Encounter (HOSPITAL_COMMUNITY): Payer: Self-pay

## 2012-04-19 ENCOUNTER — Encounter (HOSPITAL_COMMUNITY)
Admission: RE | Admit: 2012-04-19 | Discharge: 2012-04-19 | Disposition: A | Discharge status: Home / Self Care | Payer: Self-pay | Source: Ambulatory Visit | Attending: Cardiovascular Disease | Admitting: Cardiovascular Disease

## 2012-04-21 ENCOUNTER — Encounter (HOSPITAL_COMMUNITY)
Admission: RE | Admit: 2012-04-21 | Discharge: 2012-04-21 | Disposition: A | Discharge status: Home / Self Care | Payer: Self-pay | Source: Ambulatory Visit | Attending: Cardiovascular Disease | Admitting: Cardiovascular Disease

## 2012-04-23 ENCOUNTER — Encounter (HOSPITAL_COMMUNITY)
Admission: RE | Admit: 2012-04-23 | Discharge: 2012-04-23 | Disposition: A | Discharge status: Home / Self Care | Payer: Self-pay | Source: Ambulatory Visit | Attending: Cardiovascular Disease | Admitting: Cardiovascular Disease

## 2012-04-26 ENCOUNTER — Encounter (HOSPITAL_COMMUNITY)
Admission: RE | Admit: 2012-04-26 | Discharge: 2012-04-26 | Disposition: A | Discharge status: Home / Self Care | Payer: Self-pay | Source: Ambulatory Visit | Attending: Cardiovascular Disease | Admitting: Cardiovascular Disease

## 2012-04-28 ENCOUNTER — Encounter (HOSPITAL_COMMUNITY)
Admission: RE | Admit: 2012-04-28 | Discharge: 2012-04-28 | Disposition: A | Discharge status: Home / Self Care | Payer: Self-pay | Source: Ambulatory Visit | Attending: Cardiovascular Disease | Admitting: Cardiovascular Disease

## 2012-04-30 ENCOUNTER — Encounter (HOSPITAL_COMMUNITY)
Admission: RE | Admit: 2012-04-30 | Discharge: 2012-04-30 | Disposition: A | Discharge status: Home / Self Care | Payer: Self-pay | Source: Ambulatory Visit | Attending: Cardiovascular Disease | Admitting: Cardiovascular Disease

## 2012-05-03 ENCOUNTER — Encounter (HOSPITAL_COMMUNITY)
Admission: RE | Admit: 2012-05-03 | Discharge: 2012-05-03 | Disposition: A | Discharge status: Home / Self Care | Payer: Self-pay | Source: Ambulatory Visit | Attending: Cardiovascular Disease | Admitting: Cardiovascular Disease

## 2012-05-05 ENCOUNTER — Encounter (HOSPITAL_COMMUNITY)
Admission: RE | Admit: 2012-05-05 | Discharge: 2012-05-05 | Disposition: A | Discharge status: Home / Self Care | Payer: Self-pay | Source: Ambulatory Visit | Attending: Cardiovascular Disease | Admitting: Cardiovascular Disease

## 2012-05-07 ENCOUNTER — Encounter (HOSPITAL_COMMUNITY)
Admission: RE | Admit: 2012-05-07 | Discharge: 2012-05-07 | Disposition: A | Discharge status: Home / Self Care | Payer: Self-pay | Source: Ambulatory Visit | Attending: Cardiovascular Disease | Admitting: Cardiovascular Disease

## 2012-05-10 ENCOUNTER — Encounter (HOSPITAL_COMMUNITY)
Admission: RE | Admit: 2012-05-10 | Discharge: 2012-05-10 | Disposition: A | Discharge status: Home / Self Care | Payer: Self-pay | Source: Ambulatory Visit | Attending: Cardiovascular Disease | Admitting: Cardiovascular Disease

## 2012-05-12 ENCOUNTER — Encounter (HOSPITAL_COMMUNITY)
Admission: RE | Admit: 2012-05-12 | Discharge: 2012-05-12 | Disposition: A | Discharge status: Home / Self Care | Payer: Self-pay | Source: Ambulatory Visit | Attending: Cardiovascular Disease | Admitting: Cardiovascular Disease

## 2012-05-12 DIAGNOSIS — I359 Nonrheumatic aortic valve disorder, unspecified: Secondary | ICD-10-CM | POA: Insufficient documentation

## 2012-05-12 DIAGNOSIS — Z5189 Encounter for other specified aftercare: Secondary | ICD-10-CM | POA: Insufficient documentation

## 2012-05-12 DIAGNOSIS — Z954 Presence of other heart-valve replacement: Secondary | ICD-10-CM | POA: Insufficient documentation

## 2012-05-14 ENCOUNTER — Encounter (HOSPITAL_COMMUNITY)
Admission: RE | Admit: 2012-05-14 | Discharge: 2012-05-14 | Disposition: A | Discharge status: Home / Self Care | Payer: Self-pay | Source: Ambulatory Visit | Attending: Cardiovascular Disease | Admitting: Cardiovascular Disease

## 2012-05-17 ENCOUNTER — Encounter (HOSPITAL_COMMUNITY)
Admission: RE | Admit: 2012-05-17 | Discharge: 2012-05-17 | Disposition: A | Discharge status: Home / Self Care | Payer: Self-pay | Source: Ambulatory Visit | Attending: Cardiovascular Disease | Admitting: Cardiovascular Disease

## 2012-05-19 ENCOUNTER — Encounter (HOSPITAL_COMMUNITY): Payer: Self-pay

## 2012-05-21 ENCOUNTER — Encounter (HOSPITAL_COMMUNITY)
Admission: RE | Admit: 2012-05-21 | Discharge: 2012-05-21 | Disposition: A | Discharge status: Home / Self Care | Payer: Self-pay | Source: Ambulatory Visit | Attending: Cardiovascular Disease | Admitting: Cardiovascular Disease

## 2012-05-24 ENCOUNTER — Encounter (HOSPITAL_COMMUNITY)
Admission: RE | Admit: 2012-05-24 | Discharge: 2012-05-24 | Disposition: A | Discharge status: Home / Self Care | Payer: Self-pay | Source: Ambulatory Visit | Attending: Cardiovascular Disease | Admitting: Cardiovascular Disease

## 2012-05-26 ENCOUNTER — Encounter (HOSPITAL_COMMUNITY)
Admission: RE | Admit: 2012-05-26 | Discharge: 2012-05-26 | Disposition: A | Discharge status: Home / Self Care | Payer: Self-pay | Source: Ambulatory Visit | Attending: Cardiovascular Disease | Admitting: Cardiovascular Disease

## 2012-05-28 ENCOUNTER — Encounter (HOSPITAL_COMMUNITY)
Admission: RE | Admit: 2012-05-28 | Discharge: 2012-05-28 | Disposition: A | Discharge status: Home / Self Care | Payer: Self-pay | Source: Ambulatory Visit | Attending: Cardiovascular Disease | Admitting: Cardiovascular Disease

## 2012-05-31 ENCOUNTER — Encounter (HOSPITAL_COMMUNITY)
Admission: RE | Admit: 2012-05-31 | Discharge: 2012-05-31 | Disposition: A | Discharge status: Home / Self Care | Payer: Self-pay | Source: Ambulatory Visit | Attending: Cardiovascular Disease | Admitting: Cardiovascular Disease

## 2012-06-02 ENCOUNTER — Encounter (HOSPITAL_COMMUNITY): Payer: Self-pay

## 2012-06-04 ENCOUNTER — Encounter (HOSPITAL_COMMUNITY)
Admission: RE | Admit: 2012-06-04 | Discharge: 2012-06-04 | Disposition: A | Discharge status: Home / Self Care | Payer: Self-pay | Source: Ambulatory Visit | Attending: Cardiovascular Disease | Admitting: Cardiovascular Disease

## 2012-06-07 ENCOUNTER — Encounter (HOSPITAL_COMMUNITY)
Admission: RE | Admit: 2012-06-07 | Discharge: 2012-06-07 | Disposition: A | Discharge status: Home / Self Care | Payer: Self-pay | Source: Ambulatory Visit | Attending: Cardiovascular Disease | Admitting: Cardiovascular Disease

## 2012-06-09 ENCOUNTER — Encounter (HOSPITAL_COMMUNITY): Payer: Self-pay

## 2012-06-09 DIAGNOSIS — H3530 Unspecified macular degeneration: Secondary | ICD-10-CM | POA: Diagnosis not present

## 2012-06-09 DIAGNOSIS — H2510 Age-related nuclear cataract, unspecified eye: Secondary | ICD-10-CM | POA: Diagnosis not present

## 2012-06-09 DIAGNOSIS — H0230 Blepharochalasis unspecified eye, unspecified eyelid: Secondary | ICD-10-CM | POA: Diagnosis not present

## 2012-06-11 ENCOUNTER — Encounter (HOSPITAL_COMMUNITY)
Admission: RE | Admit: 2012-06-11 | Discharge: 2012-06-11 | Disposition: A | Discharge status: Home / Self Care | Payer: Self-pay | Source: Ambulatory Visit | Attending: Cardiovascular Disease | Admitting: Cardiovascular Disease

## 2012-06-11 DIAGNOSIS — Z954 Presence of other heart-valve replacement: Secondary | ICD-10-CM | POA: Insufficient documentation

## 2012-06-11 DIAGNOSIS — Z5189 Encounter for other specified aftercare: Secondary | ICD-10-CM | POA: Insufficient documentation

## 2012-06-11 DIAGNOSIS — I359 Nonrheumatic aortic valve disorder, unspecified: Secondary | ICD-10-CM | POA: Insufficient documentation

## 2012-06-14 ENCOUNTER — Encounter (HOSPITAL_COMMUNITY)
Admission: RE | Admit: 2012-06-14 | Discharge: 2012-06-14 | Disposition: A | Discharge status: Home / Self Care | Payer: Self-pay | Source: Ambulatory Visit | Attending: Cardiovascular Disease | Admitting: Cardiovascular Disease

## 2012-06-16 ENCOUNTER — Encounter (HOSPITAL_COMMUNITY)
Admission: RE | Admit: 2012-06-16 | Discharge: 2012-06-16 | Disposition: A | Discharge status: Home / Self Care | Payer: Self-pay | Source: Ambulatory Visit | Attending: Cardiovascular Disease | Admitting: Cardiovascular Disease

## 2012-06-18 ENCOUNTER — Encounter (HOSPITAL_COMMUNITY)
Admission: RE | Admit: 2012-06-18 | Discharge: 2012-06-18 | Disposition: A | Discharge status: Home / Self Care | Payer: Self-pay | Source: Ambulatory Visit | Attending: Cardiovascular Disease | Admitting: Cardiovascular Disease

## 2012-06-21 ENCOUNTER — Encounter (HOSPITAL_COMMUNITY)
Admission: RE | Admit: 2012-06-21 | Discharge: 2012-06-21 | Disposition: A | Discharge status: Home / Self Care | Payer: Self-pay | Source: Ambulatory Visit | Attending: Cardiovascular Disease | Admitting: Cardiovascular Disease

## 2012-06-23 ENCOUNTER — Encounter (HOSPITAL_COMMUNITY): Payer: Self-pay

## 2012-06-25 ENCOUNTER — Encounter (HOSPITAL_COMMUNITY): Payer: Self-pay

## 2012-06-28 ENCOUNTER — Encounter (HOSPITAL_COMMUNITY)
Admission: RE | Admit: 2012-06-28 | Discharge: 2012-06-28 | Disposition: A | Discharge status: Home / Self Care | Payer: Self-pay | Source: Ambulatory Visit | Attending: Cardiovascular Disease | Admitting: Cardiovascular Disease

## 2012-06-30 ENCOUNTER — Encounter (HOSPITAL_COMMUNITY)
Admission: RE | Admit: 2012-06-30 | Discharge: 2012-06-30 | Disposition: A | Discharge status: Home / Self Care | Payer: Self-pay | Source: Ambulatory Visit | Attending: Cardiovascular Disease | Admitting: Cardiovascular Disease

## 2012-07-02 ENCOUNTER — Encounter (HOSPITAL_COMMUNITY)
Admission: RE | Admit: 2012-07-02 | Discharge: 2012-07-02 | Disposition: A | Discharge status: Home / Self Care | Payer: Self-pay | Source: Ambulatory Visit | Attending: Cardiovascular Disease | Admitting: Cardiovascular Disease

## 2012-07-07 ENCOUNTER — Encounter (HOSPITAL_COMMUNITY)
Admission: RE | Admit: 2012-07-07 | Discharge: 2012-07-07 | Disposition: A | Discharge status: Home / Self Care | Payer: Self-pay | Source: Ambulatory Visit | Attending: Cardiovascular Disease | Admitting: Cardiovascular Disease

## 2012-07-09 ENCOUNTER — Encounter (HOSPITAL_COMMUNITY)
Admission: RE | Admit: 2012-07-09 | Discharge: 2012-07-09 | Disposition: A | Discharge status: Home / Self Care | Payer: Self-pay | Source: Ambulatory Visit | Attending: Cardiovascular Disease | Admitting: Cardiovascular Disease

## 2012-07-12 ENCOUNTER — Encounter (HOSPITAL_COMMUNITY): Payer: Self-pay

## 2012-07-12 DIAGNOSIS — I359 Nonrheumatic aortic valve disorder, unspecified: Secondary | ICD-10-CM | POA: Insufficient documentation

## 2012-07-12 DIAGNOSIS — Z954 Presence of other heart-valve replacement: Secondary | ICD-10-CM | POA: Insufficient documentation

## 2012-07-12 DIAGNOSIS — Z5189 Encounter for other specified aftercare: Secondary | ICD-10-CM | POA: Insufficient documentation

## 2012-07-14 ENCOUNTER — Encounter (HOSPITAL_COMMUNITY)
Admission: RE | Admit: 2012-07-14 | Discharge: 2012-07-14 | Disposition: A | Discharge status: Home / Self Care | Payer: Self-pay | Source: Ambulatory Visit | Attending: Cardiovascular Disease | Admitting: Cardiovascular Disease

## 2012-07-15 DIAGNOSIS — I359 Nonrheumatic aortic valve disorder, unspecified: Secondary | ICD-10-CM | POA: Diagnosis not present

## 2012-07-15 DIAGNOSIS — E782 Mixed hyperlipidemia: Secondary | ICD-10-CM | POA: Diagnosis not present

## 2012-07-15 DIAGNOSIS — I2510 Atherosclerotic heart disease of native coronary artery without angina pectoris: Secondary | ICD-10-CM | POA: Diagnosis not present

## 2012-07-16 ENCOUNTER — Encounter (HOSPITAL_COMMUNITY)
Admission: RE | Admit: 2012-07-16 | Discharge: 2012-07-16 | Disposition: A | Discharge status: Home / Self Care | Payer: Self-pay | Source: Ambulatory Visit | Attending: Cardiovascular Disease | Admitting: Cardiovascular Disease

## 2012-07-19 ENCOUNTER — Encounter (HOSPITAL_COMMUNITY)
Admission: RE | Admit: 2012-07-19 | Discharge: 2012-07-19 | Disposition: A | Discharge status: Home / Self Care | Payer: Self-pay | Source: Ambulatory Visit | Attending: Cardiovascular Disease | Admitting: Cardiovascular Disease

## 2012-07-21 ENCOUNTER — Encounter (HOSPITAL_COMMUNITY): Payer: Self-pay

## 2012-07-21 ENCOUNTER — Other Ambulatory Visit (HOSPITAL_COMMUNITY): Payer: Self-pay | Admitting: Cardiovascular Disease

## 2012-07-21 DIAGNOSIS — I25810 Atherosclerosis of coronary artery bypass graft(s) without angina pectoris: Secondary | ICD-10-CM

## 2012-07-21 DIAGNOSIS — Z952 Presence of prosthetic heart valve: Secondary | ICD-10-CM

## 2012-07-23 ENCOUNTER — Encounter (HOSPITAL_COMMUNITY): Payer: Self-pay

## 2012-07-26 ENCOUNTER — Encounter (HOSPITAL_COMMUNITY)
Admission: RE | Admit: 2012-07-26 | Discharge: 2012-07-26 | Disposition: A | Discharge status: Home / Self Care | Payer: Self-pay | Source: Ambulatory Visit | Attending: Cardiovascular Disease | Admitting: Cardiovascular Disease

## 2012-07-28 ENCOUNTER — Other Ambulatory Visit: Payer: Self-pay | Admitting: Cardiovascular Disease

## 2012-07-28 ENCOUNTER — Encounter: Payer: Self-pay | Admitting: Cardiovascular Disease

## 2012-07-28 ENCOUNTER — Encounter (HOSPITAL_COMMUNITY)
Admission: RE | Admit: 2012-07-28 | Discharge: 2012-07-28 | Disposition: A | Discharge status: Home / Self Care | Payer: Self-pay | Source: Ambulatory Visit | Attending: Cardiovascular Disease | Admitting: Cardiovascular Disease

## 2012-07-28 DIAGNOSIS — R6889 Other general symptoms and signs: Secondary | ICD-10-CM | POA: Diagnosis not present

## 2012-07-28 DIAGNOSIS — R5381 Other malaise: Secondary | ICD-10-CM | POA: Diagnosis not present

## 2012-07-28 DIAGNOSIS — E782 Mixed hyperlipidemia: Secondary | ICD-10-CM | POA: Diagnosis not present

## 2012-07-28 LAB — CBC WITH DIFFERENTIAL/PLATELET
Basophils Relative: 1 % (ref 0–1)
Eosinophils Absolute: 0.4 10*3/uL (ref 0.0–0.7)
HCT: 41.3 % (ref 39.0–52.0)
Hemoglobin: 14.1 g/dL (ref 13.0–17.0)
MCH: 31.3 pg (ref 26.0–34.0)
MCHC: 34.1 g/dL (ref 30.0–36.0)
Monocytes Absolute: 0.6 10*3/uL (ref 0.1–1.0)
Monocytes Relative: 8 % (ref 3–12)

## 2012-07-28 LAB — LIPID PANEL
HDL: 55 mg/dL (ref >39)
LDL Cholesterol: 128 mg/dL — ABNORMAL HIGH (ref 0–99)
Total CHOL/HDL Ratio: 3.5 Ratio
Triglycerides: 62 mg/dL (ref <150)
VLDL: 12 mg/dL (ref 0–40)

## 2012-07-28 LAB — COMPREHENSIVE METABOLIC PANEL
Albumin: 4.2 g/dL (ref 3.5–5.2)
Alkaline Phosphatase: 81 U/L (ref 39–117)
BUN: 24 mg/dL — ABNORMAL HIGH (ref 6–23)
Creat: 1.02 mg/dL (ref 0.50–1.35)
Glucose, Bld: 85 mg/dL (ref 70–99)
Total Bilirubin: 0.6 mg/dL (ref 0.3–1.2)

## 2012-07-30 ENCOUNTER — Encounter (HOSPITAL_COMMUNITY): Payer: Self-pay

## 2012-08-02 ENCOUNTER — Encounter (HOSPITAL_COMMUNITY)
Admission: RE | Admit: 2012-08-02 | Discharge: 2012-08-02 | Disposition: A | Discharge status: Home / Self Care | Payer: Self-pay | Source: Ambulatory Visit | Attending: Cardiovascular Disease | Admitting: Cardiovascular Disease

## 2012-08-03 ENCOUNTER — Inpatient Hospital Stay (HOSPITAL_COMMUNITY): Admission: RE | Admit: 2012-08-03 | Payer: Medicare Other | Source: Ambulatory Visit

## 2012-08-04 ENCOUNTER — Encounter (HOSPITAL_COMMUNITY): Payer: Self-pay

## 2012-08-05 ENCOUNTER — Ambulatory Visit (HOSPITAL_COMMUNITY)
Admission: RE | Admit: 2012-08-05 | Discharge: 2012-08-05 | Disposition: A | Discharge status: Home / Self Care | Payer: Medicare Other | Source: Ambulatory Visit | Attending: Cardiovascular Disease | Admitting: Cardiovascular Disease

## 2012-08-05 DIAGNOSIS — I079 Rheumatic tricuspid valve disease, unspecified: Secondary | ICD-10-CM | POA: Diagnosis not present

## 2012-08-05 DIAGNOSIS — I2510 Atherosclerotic heart disease of native coronary artery without angina pectoris: Secondary | ICD-10-CM | POA: Diagnosis not present

## 2012-08-05 DIAGNOSIS — Z952 Presence of prosthetic heart valve: Secondary | ICD-10-CM

## 2012-08-05 DIAGNOSIS — I10 Essential (primary) hypertension: Secondary | ICD-10-CM | POA: Insufficient documentation

## 2012-08-05 DIAGNOSIS — Z954 Presence of other heart-valve replacement: Secondary | ICD-10-CM | POA: Insufficient documentation

## 2012-08-05 DIAGNOSIS — I359 Nonrheumatic aortic valve disorder, unspecified: Secondary | ICD-10-CM | POA: Diagnosis not present

## 2012-08-05 DIAGNOSIS — I25810 Atherosclerosis of coronary artery bypass graft(s) without angina pectoris: Secondary | ICD-10-CM

## 2012-08-05 HISTORY — PX: TRANSTHORACIC ECHOCARDIOGRAM: SHX275

## 2012-08-05 NOTE — Progress Notes (Signed)
2D Echo Performed 08/05/2012    Missy Baksh, RCS  

## 2012-08-06 ENCOUNTER — Encounter (HOSPITAL_COMMUNITY): Payer: Self-pay

## 2012-08-09 ENCOUNTER — Encounter (HOSPITAL_COMMUNITY): Payer: Self-pay

## 2012-08-30 ENCOUNTER — Other Ambulatory Visit: Payer: Self-pay | Admitting: *Deleted

## 2012-08-30 DIAGNOSIS — I359 Nonrheumatic aortic valve disorder, unspecified: Secondary | ICD-10-CM | POA: Diagnosis not present

## 2012-08-30 DIAGNOSIS — I2510 Atherosclerotic heart disease of native coronary artery without angina pectoris: Secondary | ICD-10-CM | POA: Diagnosis not present

## 2012-08-30 DIAGNOSIS — J449 Chronic obstructive pulmonary disease, unspecified: Secondary | ICD-10-CM | POA: Diagnosis not present

## 2012-08-30 DIAGNOSIS — I739 Peripheral vascular disease, unspecified: Secondary | ICD-10-CM

## 2012-08-30 LAB — PACEMAKER DEVICE OBSERVATION

## 2012-08-31 ENCOUNTER — Encounter: Payer: Self-pay | Admitting: Cardiovascular Disease

## 2012-10-18 ENCOUNTER — Ambulatory Visit (HOSPITAL_COMMUNITY)
Admission: RE | Admit: 2012-10-18 | Discharge: 2012-10-18 | Disposition: A | Discharge status: Home / Self Care | Payer: Medicare Other | Source: Ambulatory Visit | Attending: Cardiology | Admitting: Cardiology

## 2012-10-18 DIAGNOSIS — I6529 Occlusion and stenosis of unspecified carotid artery: Secondary | ICD-10-CM | POA: Diagnosis not present

## 2012-10-18 DIAGNOSIS — I739 Peripheral vascular disease, unspecified: Secondary | ICD-10-CM | POA: Diagnosis not present

## 2012-10-18 DIAGNOSIS — I672 Cerebral atherosclerosis: Secondary | ICD-10-CM | POA: Diagnosis not present

## 2012-10-18 HISTORY — PX: OTHER SURGICAL HISTORY: SHX169

## 2012-10-18 NOTE — Progress Notes (Signed)
Carotid Duplex Completed. Malaysha Arlen, BS, RDMS, RVT  

## 2012-11-12 ENCOUNTER — Other Ambulatory Visit: Payer: Self-pay | Admitting: *Deleted

## 2012-11-12 MED ORDER — ROSUVASTATIN CALCIUM 40 MG PO TABS: 40.0000 mg | ORAL_TABLET | Freq: Every evening | ORAL | Status: DC

## 2012-11-12 NOTE — Telephone Encounter (Signed)
Rx was sent to pharmacy electronically. 

## 2012-12-03 DIAGNOSIS — Z09 Encounter for follow-up examination after completed treatment for conditions other than malignant neoplasm: Secondary | ICD-10-CM | POA: Diagnosis not present

## 2012-12-03 DIAGNOSIS — Z952 Presence of prosthetic heart valve: Secondary | ICD-10-CM | POA: Insufficient documentation

## 2012-12-03 DIAGNOSIS — I359 Nonrheumatic aortic valve disorder, unspecified: Secondary | ICD-10-CM | POA: Diagnosis not present

## 2012-12-03 DIAGNOSIS — I517 Cardiomegaly: Secondary | ICD-10-CM | POA: Diagnosis not present

## 2012-12-03 DIAGNOSIS — Z8679 Personal history of other diseases of the circulatory system: Secondary | ICD-10-CM | POA: Diagnosis not present

## 2012-12-08 DIAGNOSIS — H02839 Dermatochalasis of unspecified eye, unspecified eyelid: Secondary | ICD-10-CM | POA: Diagnosis not present

## 2012-12-08 DIAGNOSIS — H2589 Other age-related cataract: Secondary | ICD-10-CM | POA: Diagnosis not present

## 2012-12-08 DIAGNOSIS — H353190 Nonexudative age-related macular degeneration, unspecified eye, stage unspecified: Secondary | ICD-10-CM | POA: Diagnosis not present

## 2012-12-23 ENCOUNTER — Other Ambulatory Visit: Payer: Self-pay | Admitting: *Deleted

## 2012-12-23 ENCOUNTER — Telehealth: Payer: Self-pay | Admitting: Cardiovascular Disease

## 2012-12-23 MED ORDER — RAMIPRIL 10 MG PO CAPS: 10.0000 mg | ORAL_CAPSULE | Freq: Every day | ORAL | Status: DC

## 2012-12-23 MED ORDER — ROSUVASTATIN CALCIUM 40 MG PO TABS: 40.0000 mg | ORAL_TABLET | Freq: Every evening | ORAL | Status: DC

## 2012-12-23 MED ORDER — CARVEDILOL 6.25 MG PO TABS: 6.2500 mg | ORAL_TABLET | Freq: Two times a day (BID) | ORAL | Status: DC

## 2012-12-23 NOTE — Telephone Encounter (Signed)
Pt would like to speak with JC asap. Thanks.

## 2012-12-23 NOTE — Telephone Encounter (Signed)
Returned call and pt verified x 2.  Pt informed walk-in message received r/t him needing refills on carvedilol, ramipril, Crestor, Atrovent and Ventolin.  Pt informed refills for heart will be sent and will need to contact PCP for Atrovent and Ventolin.  Pt verbalized understanding and agreed w/ plan.  Refill(s) sent to pharmacy ([facility] [address]

## 2012-12-24 NOTE — Telephone Encounter (Signed)
Pt called no answer lmom

## 2012-12-28 ENCOUNTER — Telehealth: Payer: Self-pay | Admitting: Cardiovascular Disease

## 2012-12-28 NOTE — Telephone Encounter (Signed)
Wants to talk to [facility]  Please call

## 2012-12-28 NOTE — Telephone Encounter (Signed)
Message forwarded to J.C. Wildman, LPN.  

## 2012-12-29 NOTE — Telephone Encounter (Signed)
Called pt. And talked to him and answered all his questions

## 2013-01-11 DIAGNOSIS — Z23 Encounter for immunization: Secondary | ICD-10-CM | POA: Diagnosis not present

## 2013-01-21 ENCOUNTER — Ambulatory Visit (INDEPENDENT_AMBULATORY_CARE_PROVIDER_SITE_OTHER): Payer: Medicare Other | Admitting: Cardiovascular Disease

## 2013-01-21 ENCOUNTER — Encounter: Payer: Self-pay | Admitting: Cardiovascular Disease

## 2013-01-21 VITALS — BP 130/79 | HR 87 | Ht 65.0 in | Wt 149.0 lb

## 2013-01-21 DIAGNOSIS — E785 Hyperlipidemia, unspecified: Secondary | ICD-10-CM

## 2013-01-21 DIAGNOSIS — I771 Stricture of artery: Secondary | ICD-10-CM

## 2013-01-21 DIAGNOSIS — I442 Atrioventricular block, complete: Secondary | ICD-10-CM

## 2013-01-21 DIAGNOSIS — I2510 Atherosclerotic heart disease of native coronary artery without angina pectoris: Secondary | ICD-10-CM | POA: Diagnosis not present

## 2013-01-21 DIAGNOSIS — Z951 Presence of aortocoronary bypass graft: Secondary | ICD-10-CM | POA: Diagnosis not present

## 2013-01-21 DIAGNOSIS — I359 Nonrheumatic aortic valve disorder, unspecified: Secondary | ICD-10-CM | POA: Diagnosis not present

## 2013-01-21 DIAGNOSIS — I708 Atherosclerosis of other arteries: Secondary | ICD-10-CM

## 2013-01-21 DIAGNOSIS — I679 Cerebrovascular disease, unspecified: Secondary | ICD-10-CM

## 2013-01-21 DIAGNOSIS — Z950 Presence of cardiac pacemaker: Secondary | ICD-10-CM

## 2013-01-21 DIAGNOSIS — I350 Nonrheumatic aortic (valve) stenosis: Secondary | ICD-10-CM

## 2013-01-21 LAB — PACEMAKER DEVICE OBSERVATION

## 2013-01-21 NOTE — Patient Instructions (Signed)
Remote monitoring is used to monitor your pacemaker from home. This monitoring reduces the number of office visits required to check your device to one time per year. It allows [place] to keep an eye on the functioning of your device to ensure it is working properly. You are scheduled for a device check from home on 04-25-2013. You may send your transmission at any time that day. If you have a wireless device, the transmission will be sent automatically. After your physician reviews your transmission, you will receive a postcard with your next transmission date.  Your physician recommends that you schedule a follow-up appointment in: 6 months

## 2013-01-24 LAB — MDC_IDC_ENUM_SESS_TYPE_INCLINIC
Brady Statistic AP VS Percent: 0.1 % — CL
Brady Statistic AS VS Percent: 0.1 % — CL
Lead Channel Impedance Value: 580 Ohm
Lead Channel Pacing Threshold Amplitude: 0.5 V
Lead Channel Pacing Threshold Amplitude: 0.75 V
Lead Channel Sensing Intrinsic Amplitude: 2 mV
Lead Channel Setting Pacing Amplitude: 1.875
Lead Channel Setting Pacing Amplitude: 2 V
Lead Channel Setting Sensing Sensitivity: 2.8 mV

## 2013-02-03 ENCOUNTER — Encounter: Payer: Self-pay | Admitting: Cardiovascular Disease

## 2013-02-03 DIAGNOSIS — I442 Atrioventricular block, complete: Secondary | ICD-10-CM | POA: Insufficient documentation

## 2013-02-03 DIAGNOSIS — Z950 Presence of cardiac pacemaker: Secondary | ICD-10-CM | POA: Insufficient documentation

## 2013-02-03 HISTORY — DX: Atrioventricular block, complete: I44.2

## 2013-02-03 NOTE — Assessment & Plan Note (Signed)
Pacemaker check in clinic. Normal device function. Thresholds, sensing, impedances consistent with previous measurements. Device programmed to maximize longevity. No mode switch or high ventricular rates noted. Device outputs increased for greater safety margin. Histogram distribution appropriate for patient activity level. Device programmed to optimize intrinsic conduction. Estimated longevity 9 years. Patient will follow up remotely on 04-25-2013 and with MD in 6 months.

## 2013-02-03 NOTE — Assessment & Plan Note (Addendum)
Currently asymptomatic, 50-55%.

## 2013-02-03 NOTE — Progress Notes (Signed)
Patient ID: Luis Townsend, male   DOB: 1928-09-07, 79 y.o.   MRN: [id number]   Reason for office visit AS s/p TAVR, CAD s/p CABG, PKD, pacemaker  Luis Townsend is a former patient of Dr. Susa Griffins who recently retired. I performed a right and left heart catheterization on him in May of 2013. He has a long-standing history of multiple coronary and structural cardiac problems but despite this remains active and energetic.  He has coronary disease and underwent bypass surgery 95. In October 2013 he underwent TAVR at [facility] for severe aortic stenosis (26 mm Sapien via direct aortic approach). He excellent functional capacity. He is restoring [place] barn [place] and is in the process of putting up new siding.   He underwent bypass surgery 1995 (LIMA to LAD, SVG to PDA, sequential SVG to OM1, OM 2 and OM 3 ). His cardiac catheterization in 2013 showed high-grade stenosis in the distal SVG to PDA and showed that 2 of the 3 oblique marginal arteries were occluded although the bypass itself is widely patent. He had a high-grade stenosis in the left subclavian artery proximal to the vertebral ostium and underwent stenting of this vessel prior to his TAVR.   He also has a history of previous left carotid endarterectomy. He recently underwent carotid ultrasonography, which shows bilateral 50-69% internal carotid artery disease, but showed that the left subclavian stent was still widely patent. 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  His pacemaker is a dual-chamber Medtronic device implanted in October of 2013 following his aortic valve replacement procedure. He has complete heart block and is pacemaker dependent, also pacing the atrium more than 80% of the time.   No Known Allergies  Current Outpatient Prescriptions  Medication Sig Dispense Refill  . aspirin EC 81 MG tablet Take 81 mg by mouth daily.      . carvedilol (COREG) 6.25 MG tablet Take 1 tablet (6.25 mg  total) by mouth 2 (two) times daily with a meal. Need January appointment  60 tablet  3  . cholecalciferol (VITAMIN D) 1000 UNITS tablet Take 1,000 Units by mouth daily.      . fish oil-omega-3 fatty acids 1000 MG capsule Take 1 g by mouth daily.      . ramipril (ALTACE) 10 MG capsule Take 1 capsule (10 mg total) by mouth daily. Need January appointment.  30 capsule  3  . rosuvastatin (CRESTOR) 40 MG tablet Take 1 tablet (40 mg total) by mouth every evening. Need January appointment.  30 tablet  3   No current facility-administered medications for this visit.    Past Medical History  Diagnosis Date  . Myocardial infarction 03/22/1994    acute LAD occlusion  . Hypertension   . Hyperlipidemia   . S/P CABG x 5 03/28/1994    LIMA to LAD, sequential SVG to OM1-OM2-OM4, SVG to RCA, open vein harvest right leg - Dr Andrey Campanile  . Cerebrovascular disease 06/30/2011  . S/P carotid endarterectomy 10/28/1996    Dr Arbie Cookey  . Subclavian artery stenosis, left 06/30/2011  . Aortoiliac occlusive disease 06/30/2011  . Aortic stenosis 06/30/2011    severe  . Amaurosis fugax of left eye 10/11/1996  . Tobacco abuse   . COPD (chronic obstructive pulmonary disease), moderat to severe 07/29/2011  . Coronary artery disease   . PAD (peripheral artery disease)   . RBBB (right bundle branch block)     Past Surgical History  Procedure  Laterality Date  . Carotid endarterectomy  10/28/96  DR. TODD EARLY    LEFT AND DACRON PATCH ANGIOPLASTY  . Coronary artery bypass graft  03/28/94 DR. CHARLES WILSON    x5(LIMA to LAD,SVG to OM1-OM2-OM4, SVG to RCA  . Pacemaker insertion  11/12/2011    Medtronic Adapta; model# ADDR01, serial# [id number]  . Carotid doppler  10/18/2012    Rt bulb/prox ICA demonstrated 50-69% diameter reduction, Lft mid ICA demonstrated 50-69% diameter reduction, Lft subclavian- = to or <50% sig diameter reduction, Lft vertebral-appeared occluded  . Peripheral vascular angioplasty  07/29/2011    Lft Subclavian  90% stenosis, stented w/ a 7x51mm long Cordis Genesis resulting in reduction of 90% stenosos to 0% resdiual.  . Cardiac catheterization  06/24/2011    No intervention - Recommendation-angioplasty/stenting of Lft subclavian artery, high risk replacement of aortic valve w/ biological prosthesis and redo CABG w/ preservation of LIMA bypass and new SVG to PDA and OM, reevaluate carotid arteries and pulmonary function prior to surgery.  . Cardiovascular stress test  07/10/2009    Evidence of mild ischemia in Basal Anteriolateral and Mid Anterolateral regions-additional diaphragmatic attenuation. No ECG changes. Low risk.\  . Transthoracic echocardiogram  08/05/2012    EF 50-55%, septial motion showed abnormal function and dyssynergy, LA mild-moderately dilated    Family History  Problem Relation Age of Onset  . Stroke Mother   . Heart attack Father   . Pneumonia Maternal Grandmother   . Diabetes Maternal Grandfather   . Heart attack Paternal Grandmother     History   Social History  . Marital Status: Single    Spouse Name: N/A    Number of Children: N/A  . Years of Education: N/A   Occupational History  . Not on file.   Social History Main Topics  . Smoking status: Former Smoker    Quit date: 03/02/2011  . Smokeless tobacco: Never Used  . Alcohol Use: No  . Drug Use: Not on file  . Sexual Activity: Not on file   Other Topics Concern  . Not on file   Social History Narrative  . No narrative on file    Review of systems: The patient specifically denies any chest pain at rest or with exertion, dyspnea at rest or with exertion, orthopnea, paroxysmal nocturnal dyspnea, syncope, palpitations, focal neurological deficits, intermittent claudication, lower extremity edema, unexplained weight gain, cough, hemoptysis or wheezing.  The patient also denies abdominal pain, nausea, vomiting, dysphagia, diarrhea, constipation, polyuria, polydipsia, dysuria, hematuria, frequency, urgency,  abnormal bleeding or bruising, fever, chills, unexpected weight changes, mood swings, change in skin or hair texture, change in voice quality, auditory or visual problems, allergic reactions or rashes, new musculoskeletal complaints other than usual "aches and pains".   PHYSICAL EXAM BP 130/79  Pulse 87  Ht 5\' 5"  (1.651 m)  Wt 149 lb (67.586 kg)  BMI 24.79 kg/m2  General: Alert, oriented x3, no distress Head: no evidence of trauma, PERRL, EOMI, no exophtalmos or lid lag, no myxedema, no xanthelasma; normal ears, nose and oropharynx Neck: normal jugular venous pulsations and no hepatojugular reflux; brisk carotid pulses without delay and no carotid bruits Chest: clear to auscultation, no signs of consolidation by percussion or palpation, normal fremitus, symmetrical and full respiratory excursions Cardiovascular: normal position and quality of the apical impulse, regular rhythm, normal first and second heart sounds, no murmurs, rubs or gallops. Remarkably his exam shows no signs of valvular disease whatsoever. Both the sternotomy scar and his pacemaker site  appeared healthy 100% AV sequential pacing Abdomen: no tenderness or distention, no masses by palpation, no abnormal pulsatility or arterial bruits, normal bowel sounds, no hepatosplenomegaly Extremities: no clubbing, cyanosis or edema; 2+ radial, ulnar and brachial pulses bilaterally; 2+ right femoral, posterior tibial and dorsalis pedis pulses; 2+ left femoral, posterior tibial and dorsalis pedis pulses; no subclavian or femoral bruits Neurological: grossly nonfocal   EKG: 100% AV sequential pacing  Lipid Panel     Component Value Date/Time   CHOL 195 07/28/2012 1134   TRIG 62 07/28/2012 1134   HDL 55 07/28/2012 1134   CHOLHDL 3.5 07/28/2012 1134   VLDL 12 07/28/2012 1134   LDLCALC 128* 07/28/2012 1134    BMET    Component Value Date/Time   NA 142 07/28/2012 1134   K 4.9 07/28/2012 1134   CL 103 07/28/2012 1134   CO2 28 07/28/2012  1134   GLUCOSE 85 07/28/2012 1134   BUN 24* 07/28/2012 1134   CREATININE 1.02 07/28/2012 1134   CREATININE 1.00 07/30/2011 0355   CALCIUM 9.2 07/28/2012 1134   GFRNONAA 68* 07/30/2011 0355   GFRAA 79* 07/30/2011 0355     ASSESSMENT AND PLAN Aortic stenosis, critical AS with need for TAVR He has excellent function of his prosthetic stent valve. At his echocardiogram performed in June the peak gradient was only 17 mm Hg and the mean gradient was 10 mm Hg there was no aortic insufficiency. He should receive endocarditis prophylaxis.  S/P CABG x 5, stenosis to RCA VG 1995 Currently asymptomatic, 50-55%.   Cerebrovascular disease Bilateral 50-60% internal carotid artery stenosis, last scanned September 2014. No history of stroke or TIA. 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  Subclavian artery stenosis, left, pta and stent 07/29/11 No evidence of significant in-stent restenosis by duplex ultrasonography September  Hyperlipidemia He is on a maximum dose of Crestor and has a healthy diet and lifestyle. Discus adding Zetia.  Pacemaker - Medtronic dual chamber Oct 2013, for [facility] Pacemaker check in clinic. Normal device function. Thresholds, sensing, impedances consistent with previous measurements. Device programmed to maximize longevity. No mode switch or high ventricular rates noted. Device outputs increased for greater safety margin. Histogram distribution appropriate for patient activity level. Device programmed to optimize intrinsic conduction. Estimated longevity 9 years. Patient will follow up remotely on 04-25-2013 and with MD in 6 months.    Orders Placed This Encounter  Procedures  . Implantable device check  . EKG 12-Lead   No orders of the defined types were placed in this encounter.    Junious Silk, MD, [facility] [phone] office [phone] pager

## 2013-02-03 NOTE — Assessment & Plan Note (Signed)
He has excellent function of his prosthetic stent valve. At his echocardiogram performed in June the peak gradient was only 17 mm Hg and the mean gradient was 10 mm Hg there was no aortic insufficiency. He should receive endocarditis prophylaxis.

## 2013-02-03 NOTE — Assessment & Plan Note (Addendum)
Bilateral 50-60% internal carotid artery stenosis, last scanned September 2014. No history of stroke or TIA. 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

## 2013-02-03 NOTE — Assessment & Plan Note (Signed)
He is on a maximum dose of Crestor and has a healthy diet and lifestyle. Discus adding Zetia.

## 2013-02-03 NOTE — Assessment & Plan Note (Signed)
No evidence of significant in-stent restenosis by duplex ultrasonography September

## 2013-02-07 ENCOUNTER — Encounter: Payer: Self-pay | Admitting: Cardiovascular Disease

## 2013-02-16 DIAGNOSIS — H2510 Age-related nuclear cataract, unspecified eye: Secondary | ICD-10-CM | POA: Diagnosis not present

## 2013-02-16 DIAGNOSIS — H353190 Nonexudative age-related macular degeneration, unspecified eye, stage unspecified: Secondary | ICD-10-CM | POA: Diagnosis not present

## 2013-02-16 DIAGNOSIS — H02839 Dermatochalasis of unspecified eye, unspecified eyelid: Secondary | ICD-10-CM | POA: Diagnosis not present

## 2013-02-21 DIAGNOSIS — H2510 Age-related nuclear cataract, unspecified eye: Secondary | ICD-10-CM | POA: Diagnosis not present

## 2013-02-21 DIAGNOSIS — H3530 Unspecified macular degeneration: Secondary | ICD-10-CM | POA: Diagnosis not present

## 2013-03-14 DIAGNOSIS — H3560 Retinal hemorrhage, unspecified eye: Secondary | ICD-10-CM | POA: Diagnosis not present

## 2013-03-14 DIAGNOSIS — H353190 Nonexudative age-related macular degeneration, unspecified eye, stage unspecified: Secondary | ICD-10-CM | POA: Diagnosis not present

## 2013-03-14 DIAGNOSIS — H353290 Exudative age-related macular degeneration, unspecified eye, stage unspecified: Secondary | ICD-10-CM | POA: Diagnosis not present

## 2013-03-14 DIAGNOSIS — H35369 Drusen (degenerative) of macula, unspecified eye: Secondary | ICD-10-CM | POA: Diagnosis not present

## 2013-03-14 DIAGNOSIS — H35359 Cystoid macular degeneration, unspecified eye: Secondary | ICD-10-CM | POA: Diagnosis not present

## 2013-03-16 DIAGNOSIS — H35059 Retinal neovascularization, unspecified, unspecified eye: Secondary | ICD-10-CM | POA: Diagnosis not present

## 2013-03-16 DIAGNOSIS — H353290 Exudative age-related macular degeneration, unspecified eye, stage unspecified: Secondary | ICD-10-CM | POA: Diagnosis not present

## 2013-03-17 DIAGNOSIS — H353290 Exudative age-related macular degeneration, unspecified eye, stage unspecified: Secondary | ICD-10-CM | POA: Diagnosis not present

## 2013-03-17 DIAGNOSIS — H35059 Retinal neovascularization, unspecified, unspecified eye: Secondary | ICD-10-CM | POA: Diagnosis not present

## 2013-04-19 ENCOUNTER — Telehealth: Payer: Self-pay | Admitting: Cardiovascular Disease

## 2013-04-19 NOTE — Telephone Encounter (Signed)
Pt thought home remote appt must be during specific time. I updated pt that he can transmit anytime.

## 2013-04-19 NOTE — Telephone Encounter (Signed)
New message          Pt would like to know how to send a transmission. Please give pt a call.

## 2013-04-24 ENCOUNTER — Emergency Department (HOSPITAL_COMMUNITY): Payer: Medicare Other

## 2013-04-24 ENCOUNTER — Encounter (HOSPITAL_COMMUNITY): Payer: Self-pay | Admitting: Emergency Medicine

## 2013-04-24 ENCOUNTER — Emergency Department (HOSPITAL_COMMUNITY)
Admission: EM | Admit: 2013-04-24 | Discharge: 2013-04-24 | Disposition: A | Discharge status: Home / Self Care | Payer: Medicare Other | Attending: Emergency Medicine | Admitting: Emergency Medicine

## 2013-04-24 DIAGNOSIS — Z9089 Acquired absence of other organs: Secondary | ICD-10-CM | POA: Diagnosis not present

## 2013-04-24 DIAGNOSIS — Z7982 Long term (current) use of aspirin: Secondary | ICD-10-CM | POA: Insufficient documentation

## 2013-04-24 DIAGNOSIS — Z87891 Personal history of nicotine dependence: Secondary | ICD-10-CM | POA: Diagnosis not present

## 2013-04-24 DIAGNOSIS — I2510 Atherosclerotic heart disease of native coronary artery without angina pectoris: Secondary | ICD-10-CM | POA: Insufficient documentation

## 2013-04-24 DIAGNOSIS — I252 Old myocardial infarction: Secondary | ICD-10-CM | POA: Insufficient documentation

## 2013-04-24 DIAGNOSIS — Z950 Presence of cardiac pacemaker: Secondary | ICD-10-CM | POA: Insufficient documentation

## 2013-04-24 DIAGNOSIS — E785 Hyperlipidemia, unspecified: Secondary | ICD-10-CM | POA: Insufficient documentation

## 2013-04-24 DIAGNOSIS — Z8673 Personal history of transient ischemic attack (TIA), and cerebral infarction without residual deficits: Secondary | ICD-10-CM | POA: Insufficient documentation

## 2013-04-24 DIAGNOSIS — Z79899 Other long term (current) drug therapy: Secondary | ICD-10-CM | POA: Insufficient documentation

## 2013-04-24 DIAGNOSIS — R609 Edema, unspecified: Secondary | ICD-10-CM | POA: Diagnosis not present

## 2013-04-24 DIAGNOSIS — R0602 Shortness of breath: Secondary | ICD-10-CM | POA: Diagnosis not present

## 2013-04-24 DIAGNOSIS — Z8669 Personal history of other diseases of the nervous system and sense organs: Secondary | ICD-10-CM | POA: Insufficient documentation

## 2013-04-24 DIAGNOSIS — R0600 Dyspnea, unspecified: Secondary | ICD-10-CM

## 2013-04-24 DIAGNOSIS — Z9861 Coronary angioplasty status: Secondary | ICD-10-CM | POA: Diagnosis not present

## 2013-04-24 DIAGNOSIS — I10 Essential (primary) hypertension: Secondary | ICD-10-CM | POA: Insufficient documentation

## 2013-04-24 DIAGNOSIS — J441 Chronic obstructive pulmonary disease with (acute) exacerbation: Secondary | ICD-10-CM | POA: Diagnosis not present

## 2013-04-24 DIAGNOSIS — J40 Bronchitis, not specified as acute or chronic: Secondary | ICD-10-CM

## 2013-04-24 DIAGNOSIS — J438 Other emphysema: Secondary | ICD-10-CM | POA: Diagnosis not present

## 2013-04-24 DIAGNOSIS — J209 Acute bronchitis, unspecified: Secondary | ICD-10-CM | POA: Diagnosis not present

## 2013-04-24 DIAGNOSIS — Z951 Presence of aortocoronary bypass graft: Secondary | ICD-10-CM | POA: Diagnosis not present

## 2013-04-24 LAB — PACEMAKER DEVICE OBSERVATION

## 2013-04-24 LAB — BASIC METABOLIC PANEL
BUN: 18 mg/dL (ref 6–23)
CO2: 28 mEq/L (ref 19–32)
Chloride: 100 mEq/L (ref 96–112)
Creatinine, Ser: 0.9 mg/dL (ref 0.50–1.35)

## 2013-04-24 LAB — CBC
HCT: 41.2 % (ref 39.0–52.0)
Hemoglobin: 14.5 g/dL (ref 13.0–17.0)
MCH: 32.9 pg (ref 26.0–34.0)
MCHC: 35.2 g/dL (ref 30.0–36.0)
MCV: 93.4 fL (ref 78.0–100.0)
Platelets: 123 K/uL — ABNORMAL LOW (ref 150–400)
RBC: 4.41 MIL/uL (ref 4.22–5.81)
RDW: 13.1 % (ref 11.5–15.5)
WBC: 8 10*3/uL (ref 4.0–10.5)

## 2013-04-24 LAB — I-STAT TROPONIN, ED: Troponin i, poc: 0 ng/mL (ref 0.00–0.08)

## 2013-04-24 LAB — BASIC METABOLIC PANEL WITH GFR
Calcium: 9.3 mg/dL (ref 8.4–10.5)
GFR calc Af Amer: 89 mL/min — ABNORMAL LOW (ref >90)
GFR calc non Af Amer: 77 mL/min — ABNORMAL LOW (ref >90)
Glucose, Bld: 96 mg/dL (ref 70–99)
Potassium: 4.4 meq/L (ref 3.7–5.3)
Sodium: 139 meq/L (ref 137–147)

## 2013-04-24 LAB — PRO B NATRIURETIC PEPTIDE: Pro B Natriuretic peptide (BNP): 683.3 pg/mL — ABNORMAL HIGH (ref 0–450)

## 2013-04-24 MED ORDER — IPRATROPIUM-ALBUTEROL 0.5-2.5 (3) MG/3ML IN SOLN
3.0000 mL | Freq: Once | RESPIRATORY_TRACT | Status: AC
  Administered 2013-04-24: 3 mL via RESPIRATORY_TRACT
  Filled 2013-04-24: qty 3

## 2013-04-24 MED ORDER — IPRATROPIUM BROMIDE 0.02 % IN SOLN
0.5000 mg | Freq: Once | RESPIRATORY_TRACT | Status: AC
  Administered 2013-04-24: 0.5 mg via RESPIRATORY_TRACT
  Filled 2013-04-24: qty 2.5

## 2013-04-24 MED ORDER — PREDNISONE 20 MG PO TABS
40.0000 mg | ORAL_TABLET | Freq: Once | ORAL | Status: AC
  Administered 2013-04-24: 40 mg via ORAL
  Filled 2013-04-24: qty 2

## 2013-04-24 MED ORDER — PREDNISONE 20 MG PO TABS: 40.0000 mg | ORAL_TABLET | Freq: Every day | ORAL | Status: DC

## 2013-04-24 MED ORDER — ALBUTEROL SULFATE HFA 108 (90 BASE) MCG/ACT IN AERS
2.0000 | INHALATION_SPRAY | Freq: Once | RESPIRATORY_TRACT | Status: AC
  Administered 2013-04-24: 2 via RESPIRATORY_TRACT
  Filled 2013-04-24: qty 6.7

## 2013-04-24 MED ORDER — ALBUTEROL SULFATE (2.5 MG/3ML) 0.083% IN NEBU
5.0000 mg | INHALATION_SOLUTION | Freq: Once | RESPIRATORY_TRACT | Status: AC
  Administered 2013-04-24: 5 mg via RESPIRATORY_TRACT
  Filled 2013-04-24: qty 6

## 2013-04-24 NOTE — ED Notes (Signed)
Pt reports having sob for the past week that has gotten worse. Had cold or flu symptoms 3 weeks ago which he thought had gone away. Now having nonproductive cough. Denies any swelling to extremities. ekg done at triage. Airway intact.

## 2013-04-24 NOTE — ED Provider Notes (Signed)
CSN: [id number]     Arrival date & time 04/24/13  1208 History   First MD Initiated Contact with Patient 04/24/13 1218     Chief Complaint  Patient presents with  . Shortness of Breath    HPI  Luis Townsend is a 80 y.o. male with a PMH of CAD s/p CABG (1996), MI, CHB s/p pacemaker, PAD, RBBB, HTN, Hyperlipidemia, CVD s/p left carotid endartectomy, subclavian artery stenosis s/p stent (07/2011), severe aortic stenosis s/p TAVR, tobacco abuse, and amaurosis fugax of the left eye who presents to the ED for evaluation of SOB.  History was provided by the patient. Patient states that he has had progressively worsening SOB for the past 3-4 weeks. SOB worse with exertion. No orthopnea. Mild non-productive cough. Also mild wheezing. No hx of asthma. Previous tobacco user. Patient denies any chest pain. No hx of CHF or lasix. No recent travel or surgery. No hx of DVT or PE. Patient has mild pitting leg edema at baseline. No fevers, chills, change in appetite/activity, abdominal pain, emesis, diarrhea, sore throat, congestion, or rhinorrhea. Patient's cardiologist is Dr. Sallyanne Kuster who he last saw in 01/2013.    Past Medical History  Diagnosis Date  . Myocardial infarction 03/22/1994    acute LAD occlusion  . Hypertension   . Hyperlipidemia   . S/P CABG x 5 03/28/1994    LIMA to LAD, sequential SVG to OM1-OM2-OM4, SVG to RCA, open vein harvest right leg - Dr Redmond Pulling  . Cerebrovascular disease 06/30/2011  . S/P carotid endarterectomy 10/28/1996    Dr Donnetta Hutching  . Subclavian artery stenosis, left 06/30/2011  . Aortoiliac occlusive disease 06/30/2011  . Aortic stenosis 06/30/2011    severe  . Amaurosis fugax of left eye 10/11/1996  . Tobacco abuse   . COPD (chronic obstructive pulmonary disease), moderat to severe 07/29/2011  . Coronary artery disease   . PAD (peripheral artery disease)   . RBBB (right bundle branch block)   . CHB (complete heart block) 02/03/2013    Past Surgical History  Procedure  Laterality Date  . Carotid endarterectomy  10/28/96  DR. TODD EARLY    LEFT AND DACRON PATCH ANGIOPLASTY  . Coronary artery bypass graft  03/28/94 DR. CHARLES WILSON    x5(LIMA to LAD,SVG to OM1-OM2-OM4, SVG to RCA  . Pacemaker insertion  11/12/2011    Medtronic Adapta; model# ADDR01, serial# [id number]  . Carotid doppler  10/18/2012    Rt bulb/prox ICA demonstrated 50-69% diameter reduction, Lft mid ICA demonstrated 50-69% diameter reduction, Lft subclavian- = to or <50% sig diameter reduction, Lft vertebral-appeared occluded  . Peripheral vascular angioplasty  07/29/2011    Lft Subclavian 90% stenosis, stented w/ a 7x3mm long Cordis Genesis resulting in reduction of 90% stenosos to 0% resdiual.  . Cardiac catheterization  06/24/2011    No intervention - Recommendation-angioplasty/stenting of Lft subclavian artery, high risk replacement of aortic valve w/ biological prosthesis and redo CABG w/ preservation of LIMA bypass and new SVG to PDA and OM, reevaluate carotid arteries and pulmonary function prior to surgery.  . Cardiovascular stress test  07/10/2009    Evidence of mild ischemia in Basal Anteriolateral and Mid Anterolateral regions-additional diaphragmatic attenuation. No ECG changes. Low risk.\  . Transthoracic echocardiogram  08/05/2012    EF 50-55%, septial motion showed abnormal function and dyssynergy, LA mild-moderately dilated   Family History  Problem Relation Age of Onset  . Stroke Mother   . Heart attack Father   . Pneumonia Maternal Grandmother   .  Diabetes Maternal Grandfather   . Heart attack Paternal Grandmother    History  Substance Use Topics  . Smoking status: Former Smoker    Quit date: 03/02/2011  . Smokeless tobacco: Never Used  . Alcohol Use: No    Review of Systems  Constitutional: Negative for fever, chills, diaphoresis, activity change, appetite change and fatigue.  HENT: Negative for congestion, rhinorrhea and sore throat.   Respiratory: Positive for  cough, shortness of breath and wheezing. Negative for chest tightness and stridor.   Cardiovascular: Negative for chest pain and leg swelling.  Gastrointestinal: Negative for nausea, vomiting and abdominal pain.  Musculoskeletal: Negative for back pain, myalgias and neck pain.  Neurological: Negative for dizziness, weakness, light-headedness and headaches.    Allergies  Review of patient's allergies indicates no known allergies.  Home Medications   Current Outpatient Rx  Name  Route  Sig  Dispense  Refill  . aspirin EC 81 MG tablet   Oral   Take 81 mg by mouth daily.         . carvedilol (COREG) 6.25 MG tablet   Oral   Take 1 tablet (6.25 mg total) by mouth 2 (two) times daily with a meal. Need January appointment   60 tablet   3   . cholecalciferol (VITAMIN D) 1000 UNITS tablet   Oral   Take 1,000 Units by mouth daily.         . fish oil-omega-3 fatty acids 1000 MG capsule   Oral   Take 1 g by mouth daily.         . ramipril (ALTACE) 10 MG capsule   Oral   Take 1 capsule (10 mg total) by mouth daily. Need January appointment.   30 capsule   3   . rosuvastatin (CRESTOR) 40 MG tablet   Oral   Take 1 tablet (40 mg total) by mouth every evening. Need January appointment.   30 tablet   3    BP 162/63  Pulse 81  Temp(Src) 98.3 F (36.8 C) (Oral)  Resp 24  SpO2 94%  Filed Vitals:   04/24/13 1225 04/24/13 1249 04/24/13 1340 04/24/13 1631  BP: 135/66  142/60 138/62  Pulse:    88  Temp:      TempSrc:      Resp: 20  16 18   SpO2: 96% 100% 100% 99%    Physical Exam  Nursing note and vitals reviewed. Constitutional: He is oriented to person, place, and time. He appears well-developed and well-nourished. No distress.  HENT:  Head: Normocephalic and atraumatic.  Right Ear: External ear normal.  Left Ear: External ear normal.  Nose: Nose normal.  Mouth/Throat: Oropharynx is clear and moist.  Eyes: Conjunctivae are normal. Right eye exhibits no discharge.  Left eye exhibits no discharge.  Neck: Normal range of motion. Neck supple.  Cardiovascular: Normal rate, regular rhythm, normal heart sounds and intact distal pulses.  Exam reveals no gallop and no friction rub.   No murmur heard. Dorsalis pedis pulses present and equal bilaterally  Pulmonary/Chest: Effort normal. No respiratory distress. He has wheezes. He has no rales. He exhibits no tenderness.  Inspiratory and expiratory wheezing throughout. Prolonged expiration. Appears SOB. Able to speak in clear uninterrupted sentences.   Abdominal: Soft. He exhibits no distension. There is no tenderness.  Musculoskeletal: Normal range of motion. He exhibits edema. He exhibits no tenderness.  Trace pitting leg edema equal bilaterally  Neurological: He is alert and oriented to person, place, and time.  Skin: Skin is warm and dry. He is not diaphoretic.    ED Course  Procedures (including critical care time) Labs Review [place], ED   Imaging Review No results found.   EKG Interpretation   Date/Time:  Sunday April 24 2013 12:14:34 EDT Ventricular Rate:  73 PR Interval:    QRS Duration: 160 QT Interval:  432 QTC Calculation: 475 R Axis:   -87 Text Interpretation:  AV sequential or dual chamber electronic pacemaker  Abnormal ECG No previous tracing Confirmed by [facility]  MD, Hoover Browns ([id number]) on  04/24/2013 12:20:50 PM      Results for orders placed during the hospital encounter of 04/24/13  PRO B NATRIURETIC PEPTIDE      Result Value Ref Range   Pro B Natriuretic peptide (BNP) 683.3 (*) 0 - 450 pg/mL  BASIC METABOLIC PANEL      Result Value Ref Range   Sodium 139  137 - 147 mEq/L   Potassium 4.4  3.7 - 5.3 mEq/L   Chloride 100  96 - 112 mEq/L   CO2 28  19 - 32 mEq/L   Glucose, Bld 96  70 - 99 mg/dL   BUN 18  6 - 23 mg/dL   Creatinine, Ser 0.90  0.50 - 1.35 mg/dL   Calcium 9.3  8.4 - 10.5 mg/dL   GFR  calc non Af Amer 77 (*) >90 mL/min   GFR calc Af Amer 89 (*) >90 mL/min  CBC      Result Value Ref Range   WBC 8.0  4.0 - 10.5 K/uL   RBC 4.41  4.22 - 5.81 MIL/uL   Hemoglobin 14.5  13.0 - 17.0 g/dL   HCT 41.2  39.0 - 52.0 %   MCV 93.4  78.0 - 100.0 fL   MCH 32.9  26.0 - 34.0 pg   MCHC 35.2  30.0 - 36.0 g/dL   RDW 13.1  11.5 - 15.5 %   Platelets 123 (*) 150 - 400 K/uL  I-STAT TROPOININ, ED      Result Value Ref Range   Troponin i, poc 0.00  0.00 - 0.08 ng/mL   Comment 3                 DG Chest 2 View (Final result)  Result time: 04/24/13 14:50:52    Final result by Rad Results In Interface (04/24/13 14:50:52)    Narrative:   CLINICAL DATA: Shortness of breath  EXAM: CHEST 2 VIEW  COMPARISON: Study obtained earlier in the day  FINDINGS: There is underlying emphysematous change. There is no edema or consolidation. The heart size is normal. Pulmonary vascularity reflects underlying emphysema. Pacemaker leads are attached to the right atrium and right ventricle. Patient is status post aortic valve replacement and coronary artery bypass grafting. There is atherosclerotic change in the aortic arch region. No adenopathy.  IMPRESSION: Underlying emphysema. No edema or consolidation.   Electronically Signed By: Lowella Grip M.D. On: 04/24/2013 14:50          MDM   Luis Townsend is a 80 y.o. male with a PMH of CAD s/p CABG (1996), MI, CHB s/p pacemaker, PAD, RBBB, HTN, Hyperlipidemia, CVD s/p left carotid endartectomy, subclavian artery stenosis s/p stent (07/2011), severe aortic stenosis s/p TAVR, tobacco abuse, and amaurosis fugax of the left eye who presents to the ED for evaluation of SOB.  Rechecks  1:30 PM = Duoneb  ordered by Dr. Dorna Mai for continue wheezing  3:15 PM = Douneb ordered for continued wheezing which is improving. Patient states symptoms have significantly improved. Ordering Prednisone.  4:00 PM = Patient ambulating without difficulty or  increased SOB. Pulse ox 95%. States he is ready for discharge.    Etiology of SOB possibly due to bronchitis vs COPD. Chest x-ray shows chronic emphysematous changes.  Patient had wheezing on exam which improved with Duoneb treatments x 3 and prednisone in the ED. Patient symptoms improved with continued respiratory treatments. Less likely CHF. BNP elevated at 683 (unknown baseline) however chest x-ray negative for an acute cardiopulmonary process/pulmonary edema. No hx of CHF in the past. Patient had no chest pain throughout his ED visit. EKG negative for any acute ischemic changes. Troponin negative. Labs otherwise unremarkable. No hypoxia at rest or with ambulation. Vital signs stable. Patient discharged home with Albuterol inhaler and prednisone. Instructed to follow-up with cardiologist tomorrow. Return precautions, discharge instructions, and follow-up was discussed with the patient before discharge.     Discharge Medication List as of 04/24/2013  4:08 PM    START taking these medications   Details  predniSONE (DELTASONE) 20 MG tablet Take 2 tablets (40 mg total) by mouth daily., Starting 04/24/2013, Until Discontinued, Print         Final impressions: 1. Bronchitis   2. Dyspnea      Mercy Moore PA-C   This patient was discussed with Dr. Ovidio Hanger, PA-C 04/24/13 1723

## 2013-04-24 NOTE — ED Provider Notes (Signed)
Medical screening examination/treatment/procedure(s) were conducted as a shared visit with non-physician practitioner(s) and myself.  I personally evaluated the patient during the encounter.   EKG Interpretation   Date/Time:  Sunday April 24 2013 12:14:34 EDT Ventricular Rate:  73 PR Interval:    QRS Duration: 160 QT Interval:  432 QTC Calculation: 475 R Axis:   -87 Text Interpretation:  AV sequential or dual chamber electronic pacemaker  Abnormal ECG No previous tracing Confirmed by [facility]  MD, MICHEAL ([id number]) on  04/24/2013 12:20:50 PM      Pt with 2 weeks of mild SOB, but worse over the past 1 week, worse with exertion.  No pedal edema.  No CP.  Dry cough during this time.  No h/o asthma, CHF, COPD.  Slight wheeze on exam.  Has been given 1 neb, feels slightly improved. VS shows slightly low normal sat, BP is marginally high, but improved in the ED.  Awaiting CXR, BNP.  Troponin is normal.  HR is paced.  Will give another neb and then trial of ambulation.  Pt is normally quite active at home.  PCP is Dr. Linna Darner.    Saddie Benders. Aquiles Ruffini, MD 04/24/13 1330

## 2013-04-24 NOTE — ED Notes (Signed)
Respiratory called for breathing treatment.

## 2013-04-24 NOTE — Discharge Instructions (Signed)
Take 2 puffs every 4-6 hours Take prednisone for 5 days  Follow-up with cardiology tomorrow  Return to the emergency department if you develop any changing/worsening condition, chest pain, difficulty breathing, coughing up blood, fever, or any other concerns (please read additional information regarding your condition below)    Shortness of Breath Shortness of breath means you have trouble breathing. Shortness of breath may indicate that you have a medical problem. You should seek immediate medical care for shortness of breath. CAUSES   Not enough oxygen in the air (as with high altitudes or a smoke-filled room).  Short-term (acute) lung disease, including:  Infections, such as pneumonia.  Fluid in the lungs, such as heart failure.  A blood clot in the lungs (pulmonary embolism).  Long-term (chronic) lung diseases.  Heart disease (heart attack, angina, heart failure, and others).  Low red blood cells (anemia).  Poor physical fitness. This can cause shortness of breath when you exercise.  Chest or back injuries or stiffness.  Being overweight.  Smoking.  Anxiety. This can make you feel like you are not getting enough air. DIAGNOSIS  Serious medical problems can usually be found during your physical exam. Tests may also be done to determine why you are having shortness of breath. Tests may include:  Chest X-rays.  Lung function tests.  Blood tests.  Electrocardiography.  Exercise testing.  Echocardiography.  Imaging scans. Your caregiver may not be able to find a cause for your shortness of breath after your exam. In this case, it is important to have a follow-up exam with your caregiver as directed.  TREATMENT  Treatment for shortness of breath depends on the cause of your symptoms and can vary greatly. HOME CARE INSTRUCTIONS   Do not smoke. Smoking is a common cause of shortness of breath. If you smoke, ask for help to quit.  Avoid being around chemicals or  things that may bother your breathing, such as paint fumes and dust.  Rest as needed. Slowly resume your usual activities.  If medicines were prescribed, take them as directed for the full length of time directed. This includes oxygen and any inhaled medicines.  Keep all follow-up appointments as directed by your caregiver. SEEK MEDICAL CARE IF:   Your condition does not improve in the time expected.  You have a hard time doing your normal activities even with rest.  You have any side effects or problems with the medicines prescribed.  You develop any new symptoms. SEEK IMMEDIATE MEDICAL CARE IF:   Your shortness of breath gets worse.  You feel lightheaded, faint, or develop a cough not controlled with medicines.  You start coughing up blood.  You have pain with breathing.  You have chest pain or pain in your arms, shoulders, or abdomen.  You have a fever.  You are unable to walk up stairs or exercise the way you normally do. MAKE SURE YOU:  Understand these instructions.  Will watch your condition.  Will get help right away if you are not doing well or get worse. Document Released: 10/22/2000 Document Revised: 07/29/2011 Document Reviewed: 04/14/2011 [facility] Patient Information 2014 [place].  Bronchitis Bronchitis is inflammation of the airways that extend from the windpipe into the lungs (bronchi). The inflammation often causes mucus to develop, which leads to a cough. If the inflammation becomes severe, it may cause shortness of breath. CAUSES  Bronchitis may be caused by:   Viral infections.   Bacteria.   Cigarette smoke.   Allergens, pollutants, and  other irritants.  SIGNS AND SYMPTOMS  The most common symptom of bronchitis is a frequent cough that produces mucus. Other symptoms include:  Fever.   Body aches.   Chest congestion.   Chills.   Shortness of breath.   Sore throat.  DIAGNOSIS  Bronchitis is usually diagnosed  through a medical history and physical exam. Tests, such as chest X-rays, are sometimes done to rule out other conditions.  TREATMENT  You may need to avoid contact with whatever caused the problem (smoking, for example). Medicines are sometimes needed. These may include:  Antibiotics. These may be prescribed if the condition is caused by bacteria.  Cough suppressants. These may be prescribed for relief of cough symptoms.   Inhaled medicines. These may be prescribed to help open your airways and make it easier for you to breathe.   Steroid medicines. These may be prescribed for those with recurrent (chronic) bronchitis. HOME CARE INSTRUCTIONS  Get plenty of rest.   Drink enough fluids to keep your urine clear or pale yellow (unless you have a medical condition that requires fluid restriction). Increasing fluids may help thin your secretions and will prevent dehydration.   Only take over-the-counter or prescription medicines as directed by your health care provider.  Only take antibiotics as directed. Make sure you finish them even if you start to feel better.  Avoid secondhand smoke, irritating chemicals, and strong fumes. These will make bronchitis worse. If you are a smoker, quit smoking. Consider using nicotine gum or skin patches to help control withdrawal symptoms. Quitting smoking will help your lungs heal faster.   Put a cool-mist humidifier in your bedroom at night to moisten the air. This may help loosen mucus. Change the water in the humidifier daily. You can also run the hot water in your shower and sit in the bathroom with the door closed for 5 10 minutes.   Follow up with your health care provider as directed.   Wash your hands frequently to avoid catching bronchitis again or spreading an infection to others.  SEEK MEDICAL CARE IF: Your symptoms do not improve after 1 week of treatment.  SEEK IMMEDIATE MEDICAL CARE IF:  Your fever increases.  You have chills.    You have chest pain.   You have worsening shortness of breath.   You have bloody sputum.  You faint.  You have lightheadedness.  You have a severe headache.   You vomit repeatedly. MAKE SURE YOU:   Understand these instructions.  Will watch your condition.  Will get help right away if you are not doing well or get worse. Document Released: 01/27/2005 Document Revised: 11/17/2012 Document Reviewed: 09/21/2012 [facility] Patient Information 2014 [place].  Cough, Adult  A cough is a reflex that helps clear your throat and airways. It can help heal the body or may be a reaction to an irritated airway. A cough may only last 2 or 3 weeks (acute) or may last more than 8 weeks (chronic).  CAUSES Acute cough:  Viral or bacterial infections. Chronic cough:  Infections.  Allergies.  Asthma.  Post-nasal drip.  Smoking.  Heartburn or acid reflux.  Some medicines.  Chronic lung problems (COPD).  Cancer. SYMPTOMS   Cough.  Fever.  Chest pain.  Increased breathing rate.  High-pitched whistling sound when breathing (wheezing).  Colored mucus that you cough up (sputum). TREATMENT   A bacterial cough may be treated with antibiotic medicine.  A viral cough must run its course and will not respond  to antibiotics.  Your caregiver may recommend other treatments if you have a chronic cough. HOME CARE INSTRUCTIONS   Only take over-the-counter or prescription medicines for pain, discomfort, or fever as directed by your caregiver. Use cough suppressants only as directed by your caregiver.  Use a cold steam vaporizer or humidifier in your bedroom or home to help loosen secretions.  Sleep in a semi-upright position if your cough is worse at night.  Rest as needed.  Stop smoking if you smoke. SEEK IMMEDIATE MEDICAL CARE IF:   You have pus in your sputum.  Your cough starts to worsen.  You cannot control your cough with suppressants and are losing  sleep.  You begin coughing up blood.  You have difficulty breathing.  You develop pain which is getting worse or is uncontrolled with medicine.  You have a fever. MAKE SURE YOU:   Understand these instructions.  Will watch your condition.  Will get help right away if you are not doing well or get worse. Document Released: 07/26/2010 Document Revised: 04/21/2011 Document Reviewed: 07/26/2010 [facility] Patient Information 2014 [place].

## 2013-04-24 NOTE — ED Notes (Signed)
Pt ambulating without difficulty. 96% RA. States he feels much better. No signs of distress noted.

## 2013-04-24 NOTE — ED Notes (Signed)
Pt transported to xray 

## 2013-04-25 ENCOUNTER — Ambulatory Visit (INDEPENDENT_AMBULATORY_CARE_PROVIDER_SITE_OTHER): Payer: Medicare Other | Admitting: *Deleted

## 2013-04-25 DIAGNOSIS — I498 Other specified cardiac arrhythmias: Secondary | ICD-10-CM | POA: Diagnosis not present

## 2013-04-25 DIAGNOSIS — R001 Bradycardia, unspecified: Secondary | ICD-10-CM

## 2013-04-27 DIAGNOSIS — H35059 Retinal neovascularization, unspecified, unspecified eye: Secondary | ICD-10-CM | POA: Diagnosis not present

## 2013-04-27 DIAGNOSIS — H353290 Exudative age-related macular degeneration, unspecified eye, stage unspecified: Secondary | ICD-10-CM | POA: Diagnosis not present

## 2013-04-27 LAB — MDC_IDC_ENUM_SESS_TYPE_REMOTE
Brady Statistic AS VS Percent: 0 %
Date Time Interrogation Session: 20150316002855
Lead Channel Impedance Value: 567 Ohm
Lead Channel Pacing Threshold Amplitude: 0.625 V
Lead Channel Pacing Threshold Pulse Width: 0.4 ms
Lead Channel Pacing Threshold Pulse Width: 0.4 ms
Lead Channel Setting Pacing Pulse Width: 0.4 ms
Lead Channel Setting Sensing Sensitivity: 2.8 mV
MDC IDC MSMT BATTERY IMPEDANCE: 151 Ohm
MDC IDC MSMT BATTERY REMAINING LONGEVITY: 108 mo
MDC IDC MSMT BATTERY VOLTAGE: 2.79 V
MDC IDC MSMT LEADCHNL RA IMPEDANCE VALUE: 422 Ohm
MDC IDC MSMT LEADCHNL RA PACING THRESHOLD AMPLITUDE: 0.625 V
MDC IDC SET LEADCHNL RA PACING AMPLITUDE: 2 V
MDC IDC SET LEADCHNL RV PACING AMPLITUDE: 2 V
MDC IDC STAT BRADY AP VP PERCENT: 81 %
MDC IDC STAT BRADY AP VS PERCENT: 0 %
MDC IDC STAT BRADY AS VP PERCENT: 19 %

## 2013-04-28 DIAGNOSIS — H353290 Exudative age-related macular degeneration, unspecified eye, stage unspecified: Secondary | ICD-10-CM | POA: Diagnosis not present

## 2013-04-28 DIAGNOSIS — H35059 Retinal neovascularization, unspecified, unspecified eye: Secondary | ICD-10-CM | POA: Diagnosis not present

## 2013-05-02 ENCOUNTER — Ambulatory Visit (INDEPENDENT_AMBULATORY_CARE_PROVIDER_SITE_OTHER): Payer: Medicare Other | Admitting: Cardiology

## 2013-05-02 ENCOUNTER — Encounter: Payer: Self-pay | Admitting: Cardiology

## 2013-05-02 VITALS — BP 162/80 | HR 77 | Ht 65.0 in | Wt 144.0 lb

## 2013-05-02 DIAGNOSIS — I350 Nonrheumatic aortic (valve) stenosis: Secondary | ICD-10-CM

## 2013-05-02 DIAGNOSIS — I10 Essential (primary) hypertension: Secondary | ICD-10-CM | POA: Diagnosis not present

## 2013-05-02 DIAGNOSIS — J40 Bronchitis, not specified as acute or chronic: Secondary | ICD-10-CM | POA: Diagnosis not present

## 2013-05-02 DIAGNOSIS — Z950 Presence of cardiac pacemaker: Secondary | ICD-10-CM

## 2013-05-02 DIAGNOSIS — Z951 Presence of aortocoronary bypass graft: Secondary | ICD-10-CM

## 2013-05-02 DIAGNOSIS — I359 Nonrheumatic aortic valve disorder, unspecified: Secondary | ICD-10-CM

## 2013-05-02 NOTE — Assessment & Plan Note (Addendum)
Still tight lungs with wheezes, also complains of sinus infection.  Will add levaquin for 7 days.  I reordered his inhaler.  He has completed his steroids.   No HF on exam.

## 2013-05-02 NOTE — Assessment & Plan Note (Signed)
Elevated though he does have bronchitis.  Will have him have BP check in 2 weeks.

## 2013-05-02 NOTE — Patient Instructions (Addendum)
Use inhaler twice a day for 1 week, then as needed.  Take antibiotic daily for 7 days.   Your download was normal, they did have it.   Follow up with Dr. Sallyanne Kuster in 4-5 weeks.   See Krisitin our pharmacist for BP check in 2 weeks.

## 2013-05-02 NOTE — Progress Notes (Signed)
05/03/2013   PCP: Unice Cobble, MD   Chief Complaint  Patient presents with  . Shortness of Breath    S/P ER Visit    Primary Cardiologist: Dr. Sallyanne Kuster  HPI:  He underwent bypass surgery 1995 (LIMA to LAD, SVG to PDA, sequential SVG to OM1, OM 2 and OM 3 ). His cardiac catheterization in 2013 showed high-grade stenosis in the distal SVG to PDA and showed that 2 of the 3 oblique marginal arteries were occluded although the bypass itself is widely patent. He had a high-grade stenosis in the left subclavian artery proximal to the vertebral ostium and underwent stenting of this vessel prior to his TAVR.   In October 2013 he underwent TAVR at [facility] for severe aortic stenosis (26 mm Sapien via direct aortic approach). He has excellent functional capacity.  His pacemaker is a dual-chamber Medtronic device implanted in October of 2013 following his aortic valve replacement procedure. He has complete heart block and is pacemaker dependent, also pacing the atrium more than 80% of the time.  Was seen in the ER last week and diagnosed with bronchitis.  No HF on chest xray. Treated with Nebs and improved.  Also given steroid dose pack which he has completed.  No chest pain. He is here today for ER follow up.  His BP is elevated today. Weight is actually down 5 pounds.  He is feeling better since the ER visit. Does have sinus congestin now with yellow mucus. No chest pain, no SOB.  He stopped smoking 1 year ago.     No Known Allergies  Current Outpatient Prescriptions  Medication Sig Dispense Refill  . aspirin EC 81 MG tablet Take 81 mg by mouth daily.      . carvedilol (COREG) 6.25 MG tablet Take 1 tablet (6.25 mg total) by mouth 2 (two) times daily with a meal. Need January appointment  60 tablet  3  . cholecalciferol (VITAMIN D) 1000 UNITS tablet Take 1,000 Units by mouth daily.      . fish oil-omega-3 fatty acids 1000 MG capsule Take 1 g by mouth daily.      .  Pseudoeph-Doxylamine-DM-APAP (NYQUIL PO) Take 15 mLs by mouth at bedtime as needed (cough/sleep).      . ramipril (ALTACE) 10 MG capsule Take 1 capsule (10 mg total) by mouth daily. Need January appointment.  30 capsule  3  . rosuvastatin (CRESTOR) 40 MG tablet Take 1 tablet (40 mg total) by mouth every evening. Need January appointment.  30 tablet  3   No current facility-administered medications for this visit.    Past Medical History  Diagnosis Date  . Myocardial infarction 03/22/1994    acute LAD occlusion  . Hypertension   . Hyperlipidemia   . S/P CABG x 5 03/28/1994    LIMA to LAD, sequential SVG to OM1-OM2-OM4, SVG to RCA, open vein harvest right leg - Dr Redmond Pulling  . Cerebrovascular disease 06/30/2011  . S/P carotid endarterectomy 10/28/1996    Dr Donnetta Hutching  . Subclavian artery stenosis, left 06/30/2011  . Aortoiliac occlusive disease 06/30/2011  . Aortic stenosis 06/30/2011    severe  . Amaurosis fugax of left eye 10/11/1996  . Tobacco abuse   . COPD (chronic obstructive pulmonary disease), moderat to severe 07/29/2011  . Coronary artery disease   . PAD (peripheral artery disease)   . RBBB (right bundle branch block)   . CHB (complete heart block) 02/03/2013  Past Surgical History  Procedure Laterality Date  . Carotid endarterectomy  10/28/96  DR. TODD EARLY    LEFT AND DACRON PATCH ANGIOPLASTY  . Coronary artery bypass graft  03/28/94 DR. CHARLES WILSON    x5(LIMA to LAD,SVG to OM1-OM2-OM4, SVG to RCA  . Pacemaker insertion  11/12/2011    Medtronic Adapta; model# ADDR01, serial# [id number]  . Carotid doppler  10/18/2012    Rt bulb/prox ICA demonstrated 50-69% diameter reduction, Lft mid ICA demonstrated 50-69% diameter reduction, Lft subclavian- = to or <50% sig diameter reduction, Lft vertebral-appeared occluded  . Peripheral vascular angioplasty  07/29/2011    Lft Subclavian 90% stenosis, stented w/ a 7x85mm long Cordis Genesis resulting in reduction of 90% stenosos to 0% resdiual.    . Cardiac catheterization  06/24/2011    No intervention - Recommendation-angioplasty/stenting of Lft subclavian artery, high risk replacement of aortic valve w/ biological prosthesis and redo CABG w/ preservation of LIMA bypass and new SVG to PDA and OM, reevaluate carotid arteries and pulmonary function prior to surgery.  . Cardiovascular stress test  07/10/2009    Evidence of mild ischemia in Basal Anteriolateral and Mid Anterolateral regions-additional diaphragmatic attenuation. No ECG changes. Low risk.\  . Transthoracic echocardiogram  08/05/2012    EF 50-55%, septial motion showed abnormal function and dyssynergy, LA mild-moderately dilated    GOT:LXBWIOM:+ colds with bronchitis and now sinus congestion no fevers, + weight changes Skin:no rashes or ulcers HEENT:no blurred vision, no congestion CV:see HPI PUL:see HPI GI:no diarrhea constipation or melena, no indigestion GU:no hematuria, no dysuria MS:no joint pain, no claudication Neuro:no syncope, no lightheadedness Endo:no diabetes, no thyroid disease  PHYSICAL EXAM BP 162/80  Pulse 77  Ht 5\' 5"  (1.651 m)  Wt 144 lb (65.318 kg)  BMI 23.96 kg/m2 General:Pleasant affect, NAD Skin:Warm and dry, brisk capillary refill HEENT:normocephalic, sclera clear, mucus membranes moist Neck:supple, no JVD, no bruits  Heart:S1S2 RRR without murmur, gallup, rub or click Lungs:tight with diminished breath sounds without rales, rhonchi, occ. wheezes BTD:HRCB, non tender, + BS, do not palpate liver spleen or masses Ext:no lower ext edema, 2+ pedal pulses, 2+ radial pulses Neuro:alert and oriented, MAE, follows commands, + facial symmetry  EKG:AV pacing  100%  ASSESSMENT AND PLAN Bronchitis Still tight lungs with wheezes, also complains of sinus infection.  Will add levaquin for 7 days.  I reordered his inhaler.  He has completed his steroids.   No HF on exam.  Hypertension Elevated though he does have bronchitis.  Will have him have BP  check in 2 weeks.   Aortic stenosis, critical AS with TAVR 11/2011 stable  S/P CABG x 5, stenosis to RCA VG 1995 No chest pain.   Pacemaker - Medtronic dual chamber Oct 2013, for CHB Pacing approp.

## 2013-05-03 MED ORDER — LEVOFLOXACIN 500 MG PO TABS: 500.0000 mg | ORAL_TABLET | Freq: Every day | ORAL | Status: DC

## 2013-05-03 MED ORDER — ALBUTEROL SULFATE HFA 108 (90 BASE) MCG/ACT IN AERS: 2.0000 | INHALATION_SPRAY | Freq: Four times a day (QID) | RESPIRATORY_TRACT | Status: DC | PRN

## 2013-05-03 NOTE — Assessment & Plan Note (Signed)
Pacing approp.

## 2013-05-03 NOTE — Assessment & Plan Note (Signed)
stable °

## 2013-05-03 NOTE — Assessment & Plan Note (Signed)
No chest pain

## 2013-05-09 NOTE — Progress Notes (Signed)
PPM remote 

## 2013-05-13 ENCOUNTER — Encounter: Payer: Self-pay | Admitting: *Deleted

## 2013-05-16 ENCOUNTER — Ambulatory Visit (INDEPENDENT_AMBULATORY_CARE_PROVIDER_SITE_OTHER): Payer: Medicare Other | Admitting: Pharmacist Clinician (PhC)/ Clinical Pharmacy Specialist

## 2013-05-16 VITALS — BP 122/64 | HR 76 | Ht 65.0 in | Wt 140.5 lb

## 2013-05-16 DIAGNOSIS — I6529 Occlusion and stenosis of unspecified carotid artery: Secondary | ICD-10-CM | POA: Diagnosis not present

## 2013-05-16 DIAGNOSIS — I10 Essential (primary) hypertension: Secondary | ICD-10-CM | POA: Diagnosis not present

## 2013-05-16 NOTE — Patient Instructions (Signed)
Return for a a follow up appointment with Dr. Sallyanne Kuster   Your blood pressure today is 122/64  Check your blood pressure at home several times per week and keep record of the readings.  Take your BP meds as follows - continue with carvedilol and ramipril  Bring all of your meds, your BP cuff and your record of home blood pressures to your next appointment.  Exercise as you're able, try to walk approximately 30 minutes per day.  Keep salt intake to a minimum, especially watch canned and prepared boxed foods.  Eat more fresh fruits and vegetables and fewer canned items.  Avoid eating in fast food restaurants.    HOW TO TAKE YOUR BLOOD PRESSURE:   Rest 5 minutes before taking your blood pressure.    Don't smoke or drink caffeinated beverages for at least 30 minutes before.   Take your blood pressure before (not after) you eat.   Sit comfortably with your back supported and both feet on the floor (don't cross your legs).   Elevate your arm to heart level on a table or a desk.   Use the proper sized cuff. It should fit smoothly and snugly around your bare upper arm. There should be enough room to slip a fingertip under the cuff. The bottom edge of the cuff should be 1 inch above the crease of the elbow.   Ideally, take 3 measurements at one sitting and record the average.

## 2013-05-17 ENCOUNTER — Encounter: Payer: Self-pay | Admitting: Pharmacist Clinician (PhC)/ Clinical Pharmacy Specialist

## 2013-05-17 NOTE — Progress Notes (Signed)
05/17/2013 Luis Townsend [birth year] [id number]   HPI:  Luis Townsend is a 80 y.o. male patient of Dr Sallyanne Kuster, with a PMH below who presents today for a blood pressure check.  Luis Townsend has a history of CVD and was recently seen by our NP Cecilie Kicks after a visit to the ER for SOB and bronchitis.  At that time his BP was elevated at 162/80.  Of note he was taking Nyquil at that time, which contains pseudoephedrine.  He has a home BP cuff and states that the readings are mostly in then 140/70s, with a high systolic of 850 and low of 130.  His cuff is about 52-46 years old.  The patient quit tobacco in the past year, only drinks the occasional alcoholic beverage and does drink 1-2 cups of home brewed coffee per day.  His wife does most of their cooking and they are conscious of the amount of sodium in their food.  He does not do any specific exercise, however he is a very active gentleman for his age.  He is currently restoring an [address] year old barn.  He normally takes his ramipril 10 mg at night, which he did last night, but he also took a dose this am because he was coming in for the BP check.   Current Outpatient Prescriptions  Medication Sig Dispense Refill  . albuterol (PROVENTIL HFA;VENTOLIN HFA) 108 (90 BASE) MCG/ACT inhaler Inhale 2 puffs into the lungs every 6 (six) hours as needed for wheezing or shortness of breath.  1 Inhaler  0  . aspirin EC 81 MG tablet Take 81 mg by mouth daily.      . carvedilol (COREG) 6.25 MG tablet Take 1 tablet (6.25 mg total) by mouth 2 (two) times daily with a meal. Need January appointment  60 tablet  3  . cholecalciferol (VITAMIN D) 1000 UNITS tablet Take 1,000 Units by mouth daily.      . fish oil-omega-3 fatty acids 1000 MG capsule Take 1 g by mouth daily.      Marland Kitchen levofloxacin (LEVAQUIN) 500 MG tablet Take 1 tablet (500 mg total) by mouth daily.  7 tablet  0  . ramipril (ALTACE) 10 MG capsule Take 1 capsule (10 mg total) by mouth daily. Need January  appointment.  30 capsule  3  . rosuvastatin (CRESTOR) 40 MG tablet Take 1 tablet (40 mg total) by mouth every evening. Need January appointment.  30 tablet  3   No current facility-administered medications for this visit.    No Known Allergies  Past Medical History  Diagnosis Date  . Myocardial infarction 03/22/1994    acute LAD occlusion  . Hypertension   . Hyperlipidemia   . S/P CABG x 5 03/28/1994    LIMA to LAD, sequential SVG to OM1-OM2-OM4, SVG to RCA, open vein harvest right leg - Dr Redmond Pulling  . Cerebrovascular disease 06/30/2011  . S/P carotid endarterectomy 10/28/1996    Dr Donnetta Hutching  . Subclavian artery stenosis, left 06/30/2011  . Aortoiliac occlusive disease 06/30/2011  . Aortic stenosis 06/30/2011    severe  . Amaurosis fugax of left eye 10/11/1996  . Tobacco abuse   . COPD (chronic obstructive pulmonary disease), moderat to severe 07/29/2011  . Coronary artery disease   . PAD (peripheral artery disease)   . RBBB (right bundle branch block)   . CHB (complete heart block) 02/03/2013    Blood pressure 122/64, pulse 76, height 5\' 5"  (1.651 m), weight  140 lb 8 oz (63.73 kg). Standing 100/60.   ASSESSMENT AND PLAN:  Luis. Townsend BP is quite well controlled today.  I don't know how much of a drop his BP is showing because he took 2 doses about 12 hours apart.  He reports no problems with orthostasis, however he did drop 20 points when standing.   I think for his age his BP is fine.  Had he not taken 2 doses of ramipril 12 hours apart I might even consider dropping him from 10mg  to 5mg  daily.  For now I have asked that he continue with current medications and check his BP at home several times per week.  If he develops any signs of orthostasis he is to call the office so that we might make some adjustments to his medications.  Tommy Medal PharmD CPP [facility]

## 2013-05-20 ENCOUNTER — Other Ambulatory Visit: Payer: Self-pay | Admitting: *Deleted

## 2013-05-20 MED ORDER — RAMIPRIL 10 MG PO CAPS: ORAL_CAPSULE | ORAL | Status: DC

## 2013-06-01 DIAGNOSIS — H353290 Exudative age-related macular degeneration, unspecified eye, stage unspecified: Secondary | ICD-10-CM | POA: Diagnosis not present

## 2013-06-01 DIAGNOSIS — H35059 Retinal neovascularization, unspecified, unspecified eye: Secondary | ICD-10-CM | POA: Diagnosis not present

## 2013-06-06 ENCOUNTER — Ambulatory Visit (INDEPENDENT_AMBULATORY_CARE_PROVIDER_SITE_OTHER): Payer: Medicare Other | Admitting: Cardiovascular Disease

## 2013-06-06 ENCOUNTER — Encounter: Payer: Self-pay | Admitting: Cardiovascular Disease

## 2013-06-06 VITALS — BP 127/64 | HR 80 | Resp 16 | Ht 65.0 in | Wt 139.5 lb

## 2013-06-06 DIAGNOSIS — I442 Atrioventricular block, complete: Secondary | ICD-10-CM

## 2013-06-06 DIAGNOSIS — I6529 Occlusion and stenosis of unspecified carotid artery: Secondary | ICD-10-CM | POA: Diagnosis not present

## 2013-06-06 DIAGNOSIS — Z950 Presence of cardiac pacemaker: Secondary | ICD-10-CM

## 2013-06-06 DIAGNOSIS — I2510 Atherosclerotic heart disease of native coronary artery without angina pectoris: Secondary | ICD-10-CM

## 2013-06-06 DIAGNOSIS — E782 Mixed hyperlipidemia: Secondary | ICD-10-CM

## 2013-06-06 DIAGNOSIS — Z9889 Other specified postprocedural states: Secondary | ICD-10-CM

## 2013-06-06 DIAGNOSIS — E785 Hyperlipidemia, unspecified: Secondary | ICD-10-CM

## 2013-06-06 DIAGNOSIS — I708 Atherosclerosis of other arteries: Secondary | ICD-10-CM

## 2013-06-06 DIAGNOSIS — Z951 Presence of aortocoronary bypass graft: Secondary | ICD-10-CM

## 2013-06-06 DIAGNOSIS — Z79899 Other long term (current) drug therapy: Secondary | ICD-10-CM | POA: Diagnosis not present

## 2013-06-06 DIAGNOSIS — I350 Nonrheumatic aortic (valve) stenosis: Secondary | ICD-10-CM

## 2013-06-06 DIAGNOSIS — I359 Nonrheumatic aortic valve disorder, unspecified: Secondary | ICD-10-CM

## 2013-06-06 DIAGNOSIS — I771 Stricture of artery: Secondary | ICD-10-CM

## 2013-06-06 LAB — MDC_IDC_ENUM_SESS_TYPE_INCLINIC
Battery Impedance: 175 Ohm
Brady Statistic AP VP Percent: 80.2 %
Brady Statistic AP VS Percent: 0.1 % — CL
Brady Statistic AS VS Percent: 0.2 % — CL
Lead Channel Impedance Value: 428 Ohm
Lead Channel Impedance Value: 539 Ohm
Lead Channel Pacing Threshold Amplitude: 0.5 V
Lead Channel Pacing Threshold Pulse Width: 0.4 ms
Lead Channel Sensing Intrinsic Amplitude: 1.4 mV
Lead Channel Setting Pacing Amplitude: 2 V
Lead Channel Setting Pacing Pulse Width: 0.4 ms
MDC IDC MSMT BATTERY VOLTAGE: 2.79 V
MDC IDC MSMT LEADCHNL RA PACING THRESHOLD PULSEWIDTH: 0.4 ms
MDC IDC MSMT LEADCHNL RV PACING THRESHOLD AMPLITUDE: 0.5 V
MDC IDC SET LEADCHNL RA PACING AMPLITUDE: 2 V
MDC IDC SET LEADCHNL RV SENSING SENSITIVITY: 2.8 mV
MDC IDC STAT BRADY AS VP PERCENT: 19.8 %

## 2013-06-06 LAB — PACEMAKER DEVICE OBSERVATION

## 2013-06-06 NOTE — Patient Instructions (Signed)
Remote monitoring is used to monitor your Pacemaker or ICD from home. This monitoring reduces the number of office visits required to check your device to one time per year. It allows [place] to keep an eye on the functioning of your device to ensure it is working properly. You are scheduled for a device check from home on July 29th, 2015. You may send your transmission at any time that day. If you have a wireless device, the transmission will be sent automatically. After your physician reviews your transmission, you will receive a postcard with your next transmission date.  Dr Sallyanne Kuster wants you to follow-up in 6 months. You will receive a reminder letter in the mail one months in advance. If you don't receive a letter, please call our office to schedule the follow-up appointment.  Dr Sallyanne Kuster recommends that you have some blood work done in [place] - this is to be done FASTING.  Your physician has requested that you have a carotid duplex in [place]. This test is an ultrasound of the carotid arteries in your neck. It looks at blood flow through these arteries that supply the brain with blood. Allow one hour for this exam. There are no restrictions or special instructions.

## 2013-06-09 DIAGNOSIS — H35059 Retinal neovascularization, unspecified, unspecified eye: Secondary | ICD-10-CM | POA: Diagnosis not present

## 2013-06-09 DIAGNOSIS — H353290 Exudative age-related macular degeneration, unspecified eye, stage unspecified: Secondary | ICD-10-CM | POA: Diagnosis not present

## 2013-06-13 ENCOUNTER — Encounter: Payer: Self-pay | Admitting: Cardiovascular Disease

## 2013-06-13 NOTE — Assessment & Plan Note (Signed)
Plan to repeat carotid duplex ultrasound in September. No recent neurological events.

## 2013-06-13 NOTE — Progress Notes (Signed)
Patient ID: Luis Townsend, male   DOB: 1929-02-06, 80 y.o.   MRN: [id number]     Reason for office visit CAD status post CABG, aortic stenosis status post aortic stent graft, PAD status post left carotid endarterectomy and left subclavian stent, pacemaker for complete heart block  Mr. Shillingburg was seen in the emergency room last month with shortness of breath and diagnosed with bronchitis. He improved after treatment with bronchodilators and a short course of oral steroids. He is here for followup. When he last saw Cecilie Kicks in the office on March 23 he was still having quite a bit of wheezing and sinus drainage and she added an antibiotic. His blood pressure was high that day. Today he feels that he is completely overcome his respiratory infection and is back to normal. His blood pressure is also normal.  He has coronary disease and underwent bypass surgery 95. In October 2013 he underwent TAVR at [facility] for severe aortic stenosis (26 mm Sapien via direct aortic approach). He excellent functional capacity. [place] is [place] and is in the process of putting up new siding.  He underwent bypass surgery 1995 (LIMA to LAD, SVG to PDA, sequential SVG to OM1, OM 2 and OM 3 ). His cardiac catheterization in 2013 showed high-grade stenosis in the distal SVG to PDA and showed that 2 of the 3 oblique marginal arteries were occluded although the bypass itself is widely patent. He had a high-grade stenosis in the left subclavian artery proximal to the vertebral ostium and underwent stenting of this vessel prior to his TAVR.  He also has a history of previous left carotid endarterectomy. He recently underwent carotid ultrasonography, which shows bilateral 50-69% internal carotid artery disease, but showed that the left subclavian stent was still widely patent. 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  His pacemaker is a dual-chamber Medtronic device implanted  in October of 2013 following his aortic valve replacement procedure. He has complete heart block and is pacemaker dependent, also pacing the atrium more than 80% of the time.  No Known Allergies  Current Outpatient Prescriptions  Medication Sig Dispense Refill  . albuterol (PROVENTIL HFA;VENTOLIN HFA) 108 (90 BASE) MCG/ACT inhaler Inhale 2 puffs into the lungs every 6 (six) hours as needed for wheezing or shortness of breath.  1 Inhaler  0  . aspirin EC 81 MG tablet Take 81 mg by mouth daily.      . carvedilol (COREG) 6.25 MG tablet Take 1 tablet (6.25 mg total) by mouth 2 (two) times daily with a meal. Need January appointment  60 tablet  3  . cholecalciferol (VITAMIN D) 1000 UNITS tablet Take 1,000 Units by mouth daily.      . fish oil-omega-3 fatty acids 1000 MG capsule Take 1 g by mouth daily.      . ramipril (ALTACE) 10 MG capsule Take 1 capsule daily.  30 capsule  0  . rosuvastatin (CRESTOR) 40 MG tablet Take 1 tablet (40 mg total) by mouth every evening. Need January appointment.  30 tablet  3   No current facility-administered medications for this visit.    Past Medical History  Diagnosis Date  . Myocardial infarction 03/22/1994    acute LAD occlusion  . Hypertension   . Hyperlipidemia   . S/P CABG x 5 03/28/1994    LIMA to LAD, sequential SVG to OM1-OM2-OM4, SVG to RCA, open vein harvest right leg - Dr Redmond Pulling  .  Cerebrovascular disease 06/30/2011  . S/P carotid endarterectomy 10/28/1996    Dr Donnetta Hutching  . Subclavian artery stenosis, left 06/30/2011  . Aortoiliac occlusive disease 06/30/2011  . Aortic stenosis 06/30/2011    severe  . Amaurosis fugax of left eye 10/11/1996  . Tobacco abuse   . COPD (chronic obstructive pulmonary disease), moderat to severe 07/29/2011  . Coronary artery disease   . PAD (peripheral artery disease)   . RBBB (right bundle branch block)   . CHB (complete heart block) 02/03/2013    Past Surgical History  Procedure Laterality Date  . Carotid  endarterectomy  10/28/96  DR. TODD EARLY    LEFT AND DACRON PATCH ANGIOPLASTY  . Coronary artery bypass graft  03/28/94 DR. CHARLES WILSON    x5(LIMA to LAD,SVG to OM1-OM2-OM4, SVG to RCA  . Pacemaker insertion  11/12/2011    Medtronic Adapta; model# ADDR01, serial# [id number]  . Carotid doppler  10/18/2012    Rt bulb/prox ICA demonstrated 50-69% diameter reduction, Lft mid ICA demonstrated 50-69% diameter reduction, Lft subclavian- = to or <50% sig diameter reduction, Lft vertebral-appeared occluded  . Peripheral vascular angioplasty  07/29/2011    Lft Subclavian 90% stenosis, stented w/ a 7x11mm long Cordis Genesis resulting in reduction of 90% stenosos to 0% resdiual.  . Cardiac catheterization  06/24/2011    No intervention - Recommendation-angioplasty/stenting of Lft subclavian artery, high risk replacement of aortic valve w/ biological prosthesis and redo CABG w/ preservation of LIMA bypass and new SVG to PDA and OM, reevaluate carotid arteries and pulmonary function prior to surgery.  . Cardiovascular stress test  07/10/2009    Evidence of mild ischemia in Basal Anteriolateral and Mid Anterolateral regions-additional diaphragmatic attenuation. No ECG changes. Low risk.\  . Transthoracic echocardiogram  08/05/2012    EF 50-55%, septial motion showed abnormal function and dyssynergy, LA mild-moderately dilated    Family History  Problem Relation Age of Onset  . Stroke Mother   . Heart attack Father   . Pneumonia Maternal Grandmother   . Diabetes Maternal Grandfather   . Heart attack Paternal Grandmother     History   Social History  . Marital Status: Single    Spouse Name: N/A    Number of Children: N/A  . Years of Education: N/A   Occupational History  . Not on file.   Social History Main Topics  . Smoking status: Former Smoker    Quit date: 03/02/2011  . Smokeless tobacco: Never Used  . Alcohol Use: No  . Drug Use: Not on file  . Sexual Activity: Not on file   Other Topics  Concern  . Not on file   Social History Narrative  . No narrative on file    Review of systems: The patient specifically denies any chest pain at rest or with exertion, dyspnea at rest or with exertion, orthopnea, paroxysmal nocturnal dyspnea, syncope, palpitations, focal neurological deficits, intermittent claudication, lower extremity edema, unexplained weight gain, cough, hemoptysis or wheezing.  The patient also denies abdominal pain, nausea, vomiting, dysphagia, diarrhea, constipation, polyuria, polydipsia, dysuria, hematuria, frequency, urgency, abnormal bleeding or bruising, fever, chills, unexpected weight changes, mood swings, change in skin or hair texture, change in voice quality, auditory or visual problems, allergic reactions or rashes, new musculoskeletal complaints other than usual "aches and pains".   PHYSICAL EXAM BP 127/64  Pulse 80  Resp 16  Ht 5\' 5"  (1.651 m)  Wt 139 lb 8 oz (63.277 kg)  BMI 23.21 kg/m2 General: Alert, oriented x3,  no distress  Head: no evidence of trauma, PERRL, EOMI, no exophtalmos or lid lag, no myxedema, no xanthelasma; normal ears, nose and oropharynx  Neck: normal jugular venous pulsations and no hepatojugular reflux; brisk carotid pulses without delay and no carotid bruits  Chest: clear to auscultation, no signs of consolidation by percussion or palpation, normal fremitus, symmetrical and full respiratory excursions  Cardiovascular: normal position and quality of the apical impulse, regular rhythm, normal first and second heart sounds, no murmurs, rubs or gallops. Remarkably his exam shows no signs of valvular disease whatsoever. Both the sternotomy scar and his pacemaker site appeared healthy 100% AV sequential pacing  Abdomen: no tenderness or distention, no masses by palpation, no abnormal pulsatility or arterial bruits, normal bowel sounds, no hepatosplenomegaly  Extremities: no clubbing, cyanosis or edema; 2+ radial, ulnar and brachial pulses  bilaterally; 2+ right femoral, posterior tibial and dorsalis pedis pulses; 2+ left femoral, posterior tibial and dorsalis pedis pulses; no subclavian or femoral bruits  Neurological: grossly nonfocal    EKG: AV sequential pacing  Lipid Panel     Component Value Date/Time   CHOL 195 07/28/2012 1134   TRIG 62 07/28/2012 1134   HDL 55 07/28/2012 1134   CHOLHDL 3.5 07/28/2012 1134   VLDL 12 07/28/2012 1134   LDLCALC 128* 07/28/2012 1134    BMET    Component Value Date/Time   NA 139 04/24/2013 1235   K 4.4 04/24/2013 1235   CL 100 04/24/2013 1235   CO2 28 04/24/2013 1235   GLUCOSE 96 04/24/2013 1235   BUN 18 04/24/2013 1235   CREATININE 0.90 04/24/2013 1235   CREATININE 1.02 07/28/2012 1134   CALCIUM 9.3 04/24/2013 1235   GFRNONAA 77* 04/24/2013 1235   GFRAA 89* 04/24/2013 1235     ASSESSMENT AND PLAN Pacemaker - Medtronic dual chamber Oct 2013, for CHB He is pacemaker dependent, 100% ventricular pacing for complete heart block. Also has 80% atrial pacing. No tachyarrhythmias recorded. No permanent reprogramming changes today  S/P CABG x 5, stenosis to RCA VG 1995 No symptoms of acute coronary insufficiency.  Hyperlipidemia He is on the maximum dose of the most potent statin we have available. He is lean and quite active. His diet is healthy. His LDL cholesterol is still not at goal as of June 2014. May have to add Zetia. However he also has a pretty good HDL cholesterol and has not had any new coronary or peripheral vascular events in quite a while. Will review another lipid profile before making that decision.  S/P carotid endarterectomy Plan to repeat carotid duplex ultrasound in September. No recent neurological events.  Aortic stenosis, critical AS with TAVR 11/2011 Excellent function of the stent valve. Last echo was performed at [facility] in October 2014 with peak gradient of 16 and a mean gradient of 8 mm Hg and trivial aortic insufficiency. He has normal left ventricular systolic  function.  Subclavian artery stenosis, left, pta and stent 07/29/11 Last evaluated September 2014 without evidence of restenosis   Patient Instructions  Remote monitoring is used to monitor your Pacemaker or ICD from home. This monitoring reduces the number of office visits required to check your device to one time per year. It allows [place] to keep an eye on the functioning of your device to ensure it is working properly. You are scheduled for a device check from home on July 29th, 2015. You may send your transmission at any time that day. If you have a wireless device, the transmission  will be sent automatically. After your physician reviews your transmission, you will receive a postcard with your next transmission date.  Dr Sallyanne Kuster wants you to follow-up in 6 months. You will receive a reminder letter in the mail one months in advance. If you don't receive a letter, please call our office to schedule the follow-up appointment.  Dr Sallyanne Kuster recommends that you have some blood work done in [place] - this is to be done FASTING.  Your physician has requested that you have a carotid duplex in [place]. This test is an ultrasound of the carotid arteries in your neck. It looks at blood flow through these arteries that supply the brain with blood. Allow one hour for this exam. There are no restrictions or special instructions.    Orders Placed This Encounter  Procedures  . Lipid panel  . Comprehensive metabolic panel  . Implantable device check  . Doppler carotid   No orders of the defined types were placed in this encounter.    Shade Rivenbark  Sanda Klein, MD, [facility] [phone] office [phone] pager

## 2013-06-13 NOTE — Assessment & Plan Note (Signed)
He is on the maximum dose of the most potent statin we have available. He is lean and quite active. His diet is healthy. His LDL cholesterol is still not at goal as of June 2014. May have to add Zetia. However he also has a pretty good HDL cholesterol and has not had any new coronary or peripheral vascular events in quite a while. Will review another lipid profile before making that decision.

## 2013-06-13 NOTE — Assessment & Plan Note (Signed)
Last evaluated September 2014 without evidence of restenosis

## 2013-06-13 NOTE — Assessment & Plan Note (Signed)
He is pacemaker dependent, 100% ventricular pacing for complete heart block. Also has 80% atrial pacing. No tachyarrhythmias recorded. No permanent reprogramming changes today

## 2013-06-13 NOTE — Assessment & Plan Note (Signed)
Excellent function of the stent valve. Last echo was performed at [facility] in October 2014 with peak gradient of 16 and a mean gradient of 8 mm Hg and trivial aortic insufficiency. He has normal left ventricular systolic function.

## 2013-06-13 NOTE — Assessment & Plan Note (Signed)
No symptoms of acute coronary insufficiency.

## 2013-06-16 ENCOUNTER — Other Ambulatory Visit: Payer: Self-pay | Admitting: *Deleted

## 2013-06-16 MED ORDER — RAMIPRIL 10 MG PO CAPS: 10.0000 mg | ORAL_CAPSULE | Freq: Every day | ORAL | Status: DC

## 2013-06-16 NOTE — Telephone Encounter (Signed)
Rx was sent to pharmacy electronically. 

## 2013-06-27 ENCOUNTER — Other Ambulatory Visit: Payer: Self-pay | Admitting: *Deleted

## 2013-06-27 MED ORDER — CARVEDILOL 6.25 MG PO TABS: 6.2500 mg | ORAL_TABLET | Freq: Two times a day (BID) | ORAL | Status: DC

## 2013-06-27 NOTE — Telephone Encounter (Signed)
Rx was sent to pharmacy electronically. 

## 2013-07-13 DIAGNOSIS — H353290 Exudative age-related macular degeneration, unspecified eye, stage unspecified: Secondary | ICD-10-CM | POA: Diagnosis not present

## 2013-07-13 DIAGNOSIS — H35059 Retinal neovascularization, unspecified, unspecified eye: Secondary | ICD-10-CM | POA: Diagnosis not present

## 2013-07-18 DIAGNOSIS — H353290 Exudative age-related macular degeneration, unspecified eye, stage unspecified: Secondary | ICD-10-CM | POA: Diagnosis not present

## 2013-07-18 DIAGNOSIS — H35059 Retinal neovascularization, unspecified, unspecified eye: Secondary | ICD-10-CM | POA: Diagnosis not present

## 2013-07-18 DIAGNOSIS — H35359 Cystoid macular degeneration, unspecified eye: Secondary | ICD-10-CM | POA: Diagnosis not present

## 2013-08-31 DIAGNOSIS — H35059 Retinal neovascularization, unspecified, unspecified eye: Secondary | ICD-10-CM | POA: Diagnosis not present

## 2013-08-31 DIAGNOSIS — H353290 Exudative age-related macular degeneration, unspecified eye, stage unspecified: Secondary | ICD-10-CM | POA: Diagnosis not present

## 2013-09-12 DIAGNOSIS — H35059 Retinal neovascularization, unspecified, unspecified eye: Secondary | ICD-10-CM | POA: Diagnosis not present

## 2013-09-12 DIAGNOSIS — H353290 Exudative age-related macular degeneration, unspecified eye, stage unspecified: Secondary | ICD-10-CM | POA: Diagnosis not present

## 2013-09-12 DIAGNOSIS — H35359 Cystoid macular degeneration, unspecified eye: Secondary | ICD-10-CM | POA: Diagnosis not present

## 2013-10-12 DIAGNOSIS — H35059 Retinal neovascularization, unspecified, unspecified eye: Secondary | ICD-10-CM | POA: Diagnosis not present

## 2013-10-12 DIAGNOSIS — H353290 Exudative age-related macular degeneration, unspecified eye, stage unspecified: Secondary | ICD-10-CM | POA: Diagnosis not present

## 2013-10-24 ENCOUNTER — Telehealth (HOSPITAL_COMMUNITY): Payer: Self-pay | Admitting: *Deleted

## 2013-10-24 DIAGNOSIS — H35359 Cystoid macular degeneration, unspecified eye: Secondary | ICD-10-CM | POA: Diagnosis not present

## 2013-10-24 DIAGNOSIS — H35059 Retinal neovascularization, unspecified, unspecified eye: Secondary | ICD-10-CM | POA: Diagnosis not present

## 2013-10-24 DIAGNOSIS — H353290 Exudative age-related macular degeneration, unspecified eye, stage unspecified: Secondary | ICD-10-CM | POA: Diagnosis not present

## 2013-10-26 ENCOUNTER — Ambulatory Visit (HOSPITAL_COMMUNITY)
Admission: RE | Admit: 2013-10-26 | Discharge: 2013-10-26 | Disposition: A | Discharge status: Home / Self Care | Payer: Medicare Other | Source: Ambulatory Visit | Attending: Cardiovascular Disease | Admitting: Cardiovascular Disease

## 2013-10-26 DIAGNOSIS — I6529 Occlusion and stenosis of unspecified carotid artery: Secondary | ICD-10-CM | POA: Diagnosis not present

## 2013-10-26 DIAGNOSIS — I6523 Occlusion and stenosis of bilateral carotid arteries: Secondary | ICD-10-CM

## 2013-10-26 NOTE — Progress Notes (Signed)
Carotid Duplex Completed. °Brianna L Mazza,RVT °

## 2013-10-28 ENCOUNTER — Ambulatory Visit (INDEPENDENT_AMBULATORY_CARE_PROVIDER_SITE_OTHER): Payer: Medicare Other | Admitting: *Deleted

## 2013-10-28 ENCOUNTER — Telehealth: Payer: Self-pay | Admitting: *Deleted

## 2013-10-28 DIAGNOSIS — I779 Disorder of arteries and arterioles, unspecified: Secondary | ICD-10-CM

## 2013-10-28 DIAGNOSIS — I498 Other specified cardiac arrhythmias: Secondary | ICD-10-CM | POA: Diagnosis not present

## 2013-10-28 DIAGNOSIS — Z9889 Other specified postprocedural states: Secondary | ICD-10-CM

## 2013-10-28 DIAGNOSIS — I739 Peripheral vascular disease, unspecified: Secondary | ICD-10-CM

## 2013-10-28 LAB — MDC_IDC_ENUM_SESS_TYPE_REMOTE
Battery Remaining Longevity: 8.5
Brady Statistic AP VP Percent: 80.8 %
Brady Statistic AP VS Percent: 0.1 % — CL
Lead Channel Impedance Value: 411 Ohm
Lead Channel Impedance Value: 524 Ohm
Lead Channel Pacing Threshold Amplitude: 0.5 V
Lead Channel Pacing Threshold Pulse Width: 0.4 ms
Lead Channel Pacing Threshold Pulse Width: 0.4 ms
Lead Channel Setting Pacing Pulse Width: 0.4 ms
MDC IDC MSMT BATTERY IMPEDANCE: 200 Ohm
MDC IDC MSMT BATTERY VOLTAGE: 2.79 V
MDC IDC MSMT LEADCHNL RV PACING THRESHOLD AMPLITUDE: 0.625 V
MDC IDC SET LEADCHNL RA PACING AMPLITUDE: 2 V
MDC IDC SET LEADCHNL RV PACING AMPLITUDE: 2 V
MDC IDC SET LEADCHNL RV SENSING SENSITIVITY: 2.8 mV
MDC IDC STAT BRADY AS VP PERCENT: 19.2 %
MDC IDC STAT BRADY AS VS PERCENT: 0.1 % — AB

## 2013-10-28 NOTE — Telephone Encounter (Signed)
Gave pt instructions on how to send manual transmission. He verbalized understanding.

## 2013-10-28 NOTE — Progress Notes (Signed)
Remote pacemaker transmission.   

## 2013-10-28 NOTE — Telephone Encounter (Signed)
Patient notified of carotid doppler study results. Repeat carotid doppler study for 1 year ordered.   Patient also asked about his device check - informed him per our system a check was due in June and another in October. Patient states he was not notified of this. (will defer to device clinic team to follow up on)

## 2013-10-28 NOTE — Telephone Encounter (Signed)
Message copied by Fidel Levy on Fri Oct 28, 2013 11:14 AM ------      Message from: Sanda Klein      Created: Fri Oct 28, 2013  8:35 AM       Moderate blockage in right carotid, but unchanged. Repeat in 12 months ------

## 2013-10-28 NOTE — Telephone Encounter (Signed)
Called pt and he will return my call later today.

## 2013-11-01 ENCOUNTER — Encounter: Payer: Self-pay | Admitting: Cardiology

## 2013-11-04 ENCOUNTER — Encounter: Payer: Self-pay | Admitting: Cardiovascular Disease

## 2013-11-18 DIAGNOSIS — Z23 Encounter for immunization: Secondary | ICD-10-CM | POA: Diagnosis not present

## 2013-11-25 ENCOUNTER — Other Ambulatory Visit: Payer: Self-pay | Admitting: Cardiovascular Disease

## 2013-11-25 NOTE — Telephone Encounter (Signed)
Rx was sent to pharmacy electronically. 

## 2013-11-28 ENCOUNTER — Encounter: Payer: Self-pay | Admitting: *Deleted

## 2013-11-30 DIAGNOSIS — H35052 Retinal neovascularization, unspecified, left eye: Secondary | ICD-10-CM | POA: Diagnosis not present

## 2013-11-30 DIAGNOSIS — H3532 Exudative age-related macular degeneration: Secondary | ICD-10-CM | POA: Diagnosis not present

## 2013-12-12 DIAGNOSIS — H3532 Exudative age-related macular degeneration: Secondary | ICD-10-CM | POA: Diagnosis not present

## 2013-12-12 DIAGNOSIS — H35051 Retinal neovascularization, unspecified, right eye: Secondary | ICD-10-CM | POA: Diagnosis not present

## 2013-12-21 ENCOUNTER — Encounter: Payer: Self-pay | Admitting: *Deleted

## 2014-01-11 ENCOUNTER — Other Ambulatory Visit: Payer: Self-pay | Admitting: Cardiology

## 2014-01-12 DIAGNOSIS — H3532 Exudative age-related macular degeneration: Secondary | ICD-10-CM | POA: Diagnosis not present

## 2014-01-13 ENCOUNTER — Telehealth: Payer: Self-pay | Admitting: *Deleted

## 2014-01-13 MED ORDER — ALBUTEROL SULFATE HFA 108 (90 BASE) MCG/ACT IN AERS: 2.0000 | INHALATION_SPRAY | Freq: Four times a day (QID) | RESPIRATORY_TRACT | Status: DC | PRN

## 2014-01-13 NOTE — Telephone Encounter (Signed)
Pt called in for albuterol prescription. States helps occasionally. Asked if he has had any prolonged or worrying episodes of shortness of breath or fatigue, pt denies. Was seen here in April, instructed pt to follow up with PCP and schedule a return visit with [place], would fill once for the albuterol.

## 2014-01-16 NOTE — Telephone Encounter (Signed)
Agree 

## 2014-01-19 ENCOUNTER — Encounter (HOSPITAL_COMMUNITY): Payer: Self-pay | Admitting: Cardiovascular Disease

## 2014-02-06 DIAGNOSIS — H3532 Exudative age-related macular degeneration: Secondary | ICD-10-CM | POA: Diagnosis not present

## 2014-02-06 DIAGNOSIS — H35051 Retinal neovascularization, unspecified, right eye: Secondary | ICD-10-CM | POA: Diagnosis not present

## 2014-02-07 ENCOUNTER — Encounter: Payer: Self-pay | Admitting: *Deleted

## 2014-02-23 ENCOUNTER — Encounter (HOSPITAL_COMMUNITY): Payer: Self-pay | Admitting: Cardiovascular Disease

## 2014-03-02 DIAGNOSIS — H35052 Retinal neovascularization, unspecified, left eye: Secondary | ICD-10-CM | POA: Diagnosis not present

## 2014-03-02 DIAGNOSIS — H3532 Exudative age-related macular degeneration: Secondary | ICD-10-CM | POA: Diagnosis not present

## 2014-03-10 ENCOUNTER — Encounter: Payer: Self-pay | Admitting: *Deleted

## 2014-03-15 ENCOUNTER — Telehealth: Payer: Self-pay | Admitting: Cardiovascular Disease

## 2014-03-15 NOTE — Telephone Encounter (Signed)
New Message  Pt wanted to know, after receiving letter for no show, if he can wait until Dr. Loletha Townsend appt on 2/16, instead of doing remote check before.  Pt requested to speak with someone on how to proceed. Please call back and dsicuss.

## 2014-03-15 NOTE — Telephone Encounter (Signed)
Informed pt that waiting until he see Dr. Loletha Grayer on 2-16 would be ok. Pt verbalized understanding.

## 2014-03-28 ENCOUNTER — Encounter: Payer: Self-pay | Admitting: Cardiovascular Disease

## 2014-03-28 ENCOUNTER — Ambulatory Visit (INDEPENDENT_AMBULATORY_CARE_PROVIDER_SITE_OTHER): Payer: Medicare Other | Admitting: Cardiovascular Disease

## 2014-03-28 VITALS — BP 127/64 | HR 84 | Resp 16 | Ht 66.0 in | Wt 142.1 lb

## 2014-03-28 DIAGNOSIS — E785 Hyperlipidemia, unspecified: Secondary | ICD-10-CM | POA: Diagnosis not present

## 2014-03-28 DIAGNOSIS — I350 Nonrheumatic aortic (valve) stenosis: Secondary | ICD-10-CM | POA: Diagnosis not present

## 2014-03-28 DIAGNOSIS — R001 Bradycardia, unspecified: Secondary | ICD-10-CM

## 2014-03-28 DIAGNOSIS — Z79899 Other long term (current) drug therapy: Secondary | ICD-10-CM | POA: Diagnosis not present

## 2014-03-28 DIAGNOSIS — Z951 Presence of aortocoronary bypass graft: Secondary | ICD-10-CM | POA: Diagnosis not present

## 2014-03-28 DIAGNOSIS — I442 Atrioventricular block, complete: Secondary | ICD-10-CM

## 2014-03-28 DIAGNOSIS — I708 Atherosclerosis of other arteries: Secondary | ICD-10-CM | POA: Diagnosis not present

## 2014-03-28 DIAGNOSIS — I2510 Atherosclerotic heart disease of native coronary artery without angina pectoris: Secondary | ICD-10-CM

## 2014-03-28 DIAGNOSIS — I771 Stricture of artery: Secondary | ICD-10-CM

## 2014-03-28 LAB — MDC_IDC_ENUM_SESS_TYPE_REMOTE
Battery Impedance: 225 Ohm
Brady Statistic AP VP Percent: 82 %
Brady Statistic AS VP Percent: 18 %
Brady Statistic AS VS Percent: 0 %
Date Time Interrogation Session: 20160216155022
Lead Channel Impedance Value: 540 Ohm
Lead Channel Pacing Threshold Amplitude: 0.5 V
Lead Channel Pacing Threshold Pulse Width: 0.4 ms
Lead Channel Pacing Threshold Pulse Width: 0.4 ms
Lead Channel Setting Pacing Amplitude: 1.625
Lead Channel Setting Pacing Amplitude: 2 V
Lead Channel Setting Pacing Pulse Width: 0.4 ms
Lead Channel Setting Sensing Sensitivity: 2.8 mV
MDC IDC MSMT BATTERY REMAINING LONGEVITY: 99 mo
MDC IDC MSMT BATTERY VOLTAGE: 2.79 V
MDC IDC MSMT LEADCHNL RA IMPEDANCE VALUE: 411 Ohm
MDC IDC MSMT LEADCHNL RV PACING THRESHOLD AMPLITUDE: 0.625 V
MDC IDC STAT BRADY AP VS PERCENT: 0 %

## 2014-03-28 LAB — MDC_IDC_ENUM_SESS_TYPE_INCLINIC
Battery Remaining Longevity: 99 mo
Battery Voltage: 2.79 V
Brady Statistic AP VP Percent: 82 %
Lead Channel Impedance Value: 540 Ohm
Lead Channel Pacing Threshold Amplitude: 0.625 V
Lead Channel Setting Pacing Amplitude: 1.625
Lead Channel Setting Pacing Amplitude: 2 V
Lead Channel Setting Sensing Sensitivity: 2.8 mV
MDC IDC MSMT BATTERY IMPEDANCE: 225 Ohm
MDC IDC MSMT LEADCHNL RA IMPEDANCE VALUE: 411 Ohm
MDC IDC MSMT LEADCHNL RA PACING THRESHOLD AMPLITUDE: 0.5 V
MDC IDC MSMT LEADCHNL RA PACING THRESHOLD PULSEWIDTH: 0.4 ms
MDC IDC MSMT LEADCHNL RV PACING THRESHOLD PULSEWIDTH: 0.4 ms
MDC IDC SESS DTM: 20160216155022
MDC IDC SET LEADCHNL RV PACING PULSEWIDTH: 0.4 ms
MDC IDC STAT BRADY AP VS PERCENT: 0 %
MDC IDC STAT BRADY AS VP PERCENT: 18 %
MDC IDC STAT BRADY AS VS PERCENT: 0 %

## 2014-03-28 NOTE — Progress Notes (Signed)
Patient ID: Luis Townsend, male   DOB: 1928/09/14, 81 y.o.   MRN: [id number]     Reason for office visit CAD status post CABG, aortic stenosis status post aortic stent graft, PAD status post left carotid endarterectomy and left subclavian stent, pacemaker for complete heart block  Mr. Tetterton feels well and has only mild dyspnea on exertion (appears to be at baseline). [place] is still [place].   Pacemaker interrogation shows normal device function. 80% A pced, 100% V paced (CHB, pacer dependent). Good heart rate histogram. One brief episode of asymptomatic NSVT, no PAF.  Did not have his lipids checked last summer as planned.  He has coronary disease and underwent bypass surgery 95. In October 2013 he underwent TAVR at [facility] for severe aortic stenosis (26 mm Sapien via direct aortic approach). He underwent bypass surgery 1995 (LIMA to LAD, SVG to PDA, sequential SVG to OM1, OM 2 and OM 3 ). His cardiac catheterization in 2013 showed high-grade stenosis in the distal SVG to PDA and showed that 2 of the 3 oblique marginal arteries were occluded although the bypass itself is widely patent. He had a high-grade stenosis in the left subclavian artery proximal to the vertebral ostium and underwent stenting of this vessel prior to his TAVR.  He also has a history of previous left carotid endarterectomy. He recently underwent carotid ultrasonography, which shows bilateral 50-69% internal carotid artery disease, but showed that the left subclavian stent was still widely patent. 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  His pacemaker is a dual-chamber Medtronic device implanted in October of 2013 following his aortic valve replacement procedure. He has complete heart block and is pacemaker dependent, also pacing the atrium about 80% of the time.   No Known Allergies  Current Outpatient Prescriptions  Medication Sig Dispense Refill  . albuterol  (PROVENTIL HFA;VENTOLIN HFA) 108 (90 BASE) MCG/ACT inhaler Inhale 2 puffs into the lungs every 6 (six) hours as needed for wheezing or shortness of breath. 1 Inhaler 0  . aspirin EC 81 MG tablet Take 81 mg by mouth daily.    . carvedilol (COREG) 6.25 MG tablet Take 1 tablet (6.25 mg total) by mouth 2 (two) times daily with a meal. 60 tablet 11  . cholecalciferol (VITAMIN D) 1000 UNITS tablet Take 1,000 Units by mouth daily.    . CRESTOR 40 MG tablet TAKE 1 TABLET (40 MG TOTAL) BY MOUTH EVERY EVENING. 30 tablet 6  . fish oil-omega-3 fatty acids 1000 MG capsule Take 1 g by mouth daily.    . ramipril (ALTACE) 10 MG capsule Take 1 capsule (10 mg total) by mouth daily. 30 capsule 11   No current facility-administered medications for this visit.    Past Medical History  Diagnosis Date  . Myocardial infarction 03/22/1994    acute LAD occlusion  . Hypertension   . Hyperlipidemia   . S/P CABG x 5 03/28/1994    LIMA to LAD, sequential SVG to OM1-OM2-OM4, SVG to RCA, open vein harvest right leg - Dr Redmond Pulling  . Cerebrovascular disease 06/30/2011  . S/P carotid endarterectomy 10/28/1996    Dr Donnetta Hutching  . Subclavian artery stenosis, left 06/30/2011  . Aortoiliac occlusive disease 06/30/2011  . Aortic stenosis 06/30/2011    severe  . Amaurosis fugax of left eye 10/11/1996  . Tobacco abuse   . COPD (chronic obstructive pulmonary disease), moderat to severe 07/29/2011  . Coronary artery disease   .  PAD (peripheral artery disease)   . RBBB (right bundle branch block)   . CHB (complete heart block) 02/03/2013    Past Surgical History  Procedure Laterality Date  . Carotid endarterectomy  10/28/96  DR. TODD EARLY    LEFT AND DACRON PATCH ANGIOPLASTY  . Coronary artery bypass graft  03/28/94 DR. CHARLES WILSON    x5(LIMA to LAD,SVG to OM1-OM2-OM4, SVG to RCA  . Pacemaker insertion  11/12/2011    Medtronic Adapta; model# ADDR01, serial# [id number]  . Carotid doppler  10/18/2012    Rt bulb/prox ICA demonstrated  50-69% diameter reduction, Lft mid ICA demonstrated 50-69% diameter reduction, Lft subclavian- = to or <50% sig diameter reduction, Lft vertebral-appeared occluded  . Peripheral vascular angioplasty  07/29/2011    Lft Subclavian 90% stenosis, stented w/ a 7x19mm long Cordis Genesis resulting in reduction of 90% stenosos to 0% resdiual.  . Cardiac catheterization  06/24/2011    No intervention - Recommendation-angioplasty/stenting of Lft subclavian artery, high risk replacement of aortic valve w/ biological prosthesis and redo CABG w/ preservation of LIMA bypass and new SVG to PDA and OM, reevaluate carotid arteries and pulmonary function prior to surgery.  . Cardiovascular stress test  07/10/2009    Evidence of mild ischemia in Basal Anteriolateral and Mid Anterolateral regions-additional diaphragmatic attenuation. No ECG changes. Low risk.\  . Transthoracic echocardiogram  08/05/2012    EF 50-55%, septial motion showed abnormal function and dyssynergy, LA mild-moderately dilated  . Left heart catheterization with coronary/graft angiogram N/A 06/24/2011    Procedure: LEFT HEART CATHETERIZATION WITH Beatrix Fetters;  Surgeon: Sanda Klein, MD;  Location: [place] CATH LAB;  Service: Cardiovascular;  Laterality: N/A;  . Bilateral upper extremity angiogram N/A 07/29/2011    Procedure: BILATERAL UPPER EXTREMITY ANGIOGRAM;  Surgeon: Lorretta Harp, MD;  Location: [facility] CATH LAB;  Service: Cardiovascular;  Laterality: N/A;    Family History  Problem Relation Age of Onset  . Stroke Mother   . Heart attack Father   . Pneumonia Maternal Grandmother   . Diabetes Maternal Grandfather   . Heart attack Paternal Grandmother     History   Social History  . Marital Status: Single    Spouse Name: N/A  . Number of Children: N/A  . Years of Education: N/A   Occupational History  . Not on file.   Social History Main Topics  . Smoking status: Former Smoker    Quit date: 03/02/2011  . Smokeless  tobacco: Never Used  . Alcohol Use: No  . Drug Use: Not on file  . Sexual Activity: Not on file   Other Topics Concern  . Not on file   Social History Narrative    Review of systems: The patient specifically denies any chest pain at rest or with exertion, dyspnea at rest or with exertion, orthopnea, paroxysmal nocturnal dyspnea, syncope, palpitations, focal neurological deficits, intermittent claudication, lower extremity edema, unexplained weight gain, cough, hemoptysis or wheezing.  The patient also denies abdominal pain, nausea, vomiting, dysphagia, diarrhea, constipation, polyuria, polydipsia, dysuria, hematuria, frequency, urgency, abnormal bleeding or bruising, fever, chills, unexpected weight changes, mood swings, change in skin or hair texture, change in voice quality, auditory or visual problems, allergic reactions or rashes, new musculoskeletal complaints other than usual "aches and pains".  PHYSICAL EXAM BP 127/64 mmHg  Pulse 84  Resp 16  Ht 5\' 6"  (1.676 m)  Wt 64.456 kg (142 lb 1.6 oz)  BMI 22.95 kg/m2 General: Alert, oriented x3, no distress  Head: no evidence  of trauma, PERRL, EOMI, no exophtalmos or lid lag, no myxedema, no xanthelasma; normal ears, nose and oropharynx  Neck: normal jugular venous pulsations and no hepatojugular reflux; brisk carotid pulses without delay and no carotid bruits  Chest: clear to auscultation, no signs of consolidation by percussion or palpation, normal fremitus, symmetrical and full respiratory excursions  Cardiovascular: normal position and quality of the apical impulse, regular rhythm, normal first and second heart sounds, no murmurs, rubs or gallops. Remarkably his exam shows no signs of valvular disease whatsoever. Both the sternotomy scar and his pacemaker site appeared healthy 100% AV sequential pacing  Abdomen: no tenderness or distention, no masses by palpation, no abnormal pulsatility or arterial bruits, normal bowel sounds, no  hepatosplenomegaly  Extremities: no clubbing, cyanosis or edema; 2+ radial, ulnar and brachial pulses bilaterally; 2+ right femoral, posterior tibial and dorsalis pedis pulses; 2+ left femoral, posterior tibial and dorsalis pedis pulses; no subclavian or femoral bruits  Neurological: grossly nonfocal   EKG: Apaced Vpaced  Lipid Panel     Component Value Date/Time   CHOL 195 07/28/2012 1134   TRIG 62 07/28/2012 1134   HDL 55 07/28/2012 1134   CHOLHDL 3.5 07/28/2012 1134   VLDL 12 07/28/2012 1134   LDLCALC 128* 07/28/2012 1134    BMET    Component Value Date/Time   NA 139 04/24/2013 1235   K 4.4 04/24/2013 1235   CL 100 04/24/2013 1235   CO2 28 04/24/2013 1235   GLUCOSE 96 04/24/2013 1235   BUN 18 04/24/2013 1235   CREATININE 0.90 04/24/2013 1235   CREATININE 1.02 07/28/2012 1134   CALCIUM 9.3 04/24/2013 1235   GFRNONAA 77* 04/24/2013 1235   GFRAA 89* 04/24/2013 1235     ASSESSMENT AND PLAN Pacemaker - Medtronic dual chamber Oct 2013, for CHB He is pacemaker dependent, 100% ventricular pacing for complete heart block. Also has 80% atrial pacing. No meaningful tachyarrhythmias recorded. No permanent reprogramming changes today  S/P CABG x 5 1995, stenosis to RCA SVG  No symptoms of acute coronary insufficiency.  Hyperlipidemia He is on the maximum dose of the most potent statin we have available. He is lean and quite active. His diet is healthy. His LDL cholesterol is still not at goal as of June 2014. May have to add Zetia. However he also has a pretty good HDL cholesterol and has not had any new coronary or peripheral vascular events in quite a while. Will review another lipid profile before making that decision.  S/P carotid endarterectomy Moderate blockage in right ICA unchanged on [place] last September. No recent neurological events.  Aortic stenosis, critical AS with TAVR 11/2011 Excellent function of the stent valve. Last echo was performed at [facility] in October 2014  with peak gradient of 16 and a mean gradient of 8 mm Hg and trivial aortic insufficiency. He has normal left ventricular systolic function.  Subclavian artery stenosis, left, pta-stent 07/29/11 Last evaluated September 2015 with stable, <50% restenosis. Occluded left vertebral artery.  Orders Placed This Encounter  Procedures  . Lipid panel  . Comprehensive metabolic panel  . EKG 12-Lead   No orders of the defined types were placed in this encounter.    Holli Humbles, MD, [place] [phone] office [phone] pager

## 2014-03-28 NOTE — Patient Instructions (Signed)
Your physician recommends that you return for lab work in: FASTING at [facility].  Remote monitoring is used to monitor your Pacemaker or ICD from home. This monitoring reduces the number of office visits required to check your device to one time per year. It allows [place] to monitor the functioning of your device to ensure it is working properly. You are scheduled for a device check from home on Jun 27, 2014. You may send your transmission at any time that day. If you have a wireless device, the transmission will be sent automatically. After your physician reviews your transmission, you will receive a postcard with your next transmission date.  Dr. Sallyanne Kuster recommends that you schedule a follow-up appointment in: One Year.

## 2014-04-06 ENCOUNTER — Encounter: Payer: Self-pay | Admitting: Cardiovascular Disease

## 2014-04-10 DIAGNOSIS — H35051 Retinal neovascularization, unspecified, right eye: Secondary | ICD-10-CM | POA: Diagnosis not present

## 2014-04-10 DIAGNOSIS — H3532 Exudative age-related macular degeneration: Secondary | ICD-10-CM | POA: Diagnosis not present

## 2014-04-11 DIAGNOSIS — E785 Hyperlipidemia, unspecified: Secondary | ICD-10-CM | POA: Diagnosis not present

## 2014-04-11 DIAGNOSIS — Z79899 Other long term (current) drug therapy: Secondary | ICD-10-CM | POA: Diagnosis not present

## 2014-04-12 LAB — COMPREHENSIVE METABOLIC PANEL
ALT: 22 U/L (ref 0–53)
AST: 23 U/L (ref 0–37)
Albumin: 3.7 g/dL (ref 3.5–5.2)
Alkaline Phosphatase: 84 U/L (ref 39–117)
BILIRUBIN TOTAL: 0.9 mg/dL (ref 0.2–1.2)
BUN: 19 mg/dL (ref 6–23)
CO2: 31 meq/L (ref 19–32)
Calcium: 8.8 mg/dL (ref 8.4–10.5)
Chloride: 102 mEq/L (ref 96–112)
Creat: 0.88 mg/dL (ref 0.50–1.35)
Glucose, Bld: 80 mg/dL (ref 70–99)
Potassium: 4.6 mEq/L (ref 3.5–5.3)
Sodium: 140 mEq/L (ref 135–145)
Total Protein: 6.5 g/dL (ref 6.0–8.3)

## 2014-04-12 LAB — LIPID PANEL
CHOLESTEROL: 164 mg/dL (ref 0–200)
HDL: 63 mg/dL (ref >=40)
LDL Cholesterol: 89 mg/dL (ref 0–99)
TRIGLYCERIDES: 58 mg/dL (ref <150)
Total CHOL/HDL Ratio: 2.6 Ratio
VLDL: 12 mg/dL (ref 0–40)

## 2014-05-03 DIAGNOSIS — H3532 Exudative age-related macular degeneration: Secondary | ICD-10-CM | POA: Diagnosis not present

## 2014-05-11 DIAGNOSIS — H3532 Exudative age-related macular degeneration: Secondary | ICD-10-CM | POA: Diagnosis not present

## 2014-05-11 DIAGNOSIS — H02834 Dermatochalasis of left upper eyelid: Secondary | ICD-10-CM | POA: Diagnosis not present

## 2014-05-11 DIAGNOSIS — Z961 Presence of intraocular lens: Secondary | ICD-10-CM | POA: Diagnosis not present

## 2014-05-11 DIAGNOSIS — H2512 Age-related nuclear cataract, left eye: Secondary | ICD-10-CM | POA: Diagnosis not present

## 2014-05-11 DIAGNOSIS — H26491 Other secondary cataract, right eye: Secondary | ICD-10-CM | POA: Diagnosis not present

## 2014-05-11 DIAGNOSIS — H02831 Dermatochalasis of right upper eyelid: Secondary | ICD-10-CM | POA: Diagnosis not present

## 2014-06-01 ENCOUNTER — Other Ambulatory Visit: Payer: Self-pay | Admitting: Cardiovascular Disease

## 2014-06-01 NOTE — Telephone Encounter (Signed)
Rx(s) sent to pharmacy electronically.  

## 2014-06-12 DIAGNOSIS — H3532 Exudative age-related macular degeneration: Secondary | ICD-10-CM | POA: Diagnosis not present

## 2014-06-12 DIAGNOSIS — H35052 Retinal neovascularization, unspecified, left eye: Secondary | ICD-10-CM | POA: Diagnosis not present

## 2014-06-12 DIAGNOSIS — H3531 Nonexudative age-related macular degeneration: Secondary | ICD-10-CM | POA: Diagnosis not present

## 2014-06-27 ENCOUNTER — Ambulatory Visit (INDEPENDENT_AMBULATORY_CARE_PROVIDER_SITE_OTHER): Payer: Medicare Other | Admitting: *Deleted

## 2014-06-27 ENCOUNTER — Telehealth: Payer: Self-pay | Admitting: Cardiology

## 2014-06-27 DIAGNOSIS — I442 Atrioventricular block, complete: Secondary | ICD-10-CM | POA: Diagnosis not present

## 2014-06-27 NOTE — Progress Notes (Signed)
Remote pacemaker transmission.   

## 2014-06-27 NOTE — Telephone Encounter (Signed)
Spoke with pt and reminded pt of remote transmission that is due today. Pt verbalized understanding.   

## 2014-06-30 ENCOUNTER — Other Ambulatory Visit: Payer: Self-pay | Admitting: Cardiovascular Disease

## 2014-06-30 LAB — CUP PACEART REMOTE DEVICE CHECK
Battery Remaining Longevity: 95 mo
Battery Voltage: 2.79 V
Brady Statistic AP VP Percent: 83 %
Brady Statistic AS VP Percent: 17 %
Brady Statistic AS VS Percent: 0 %
Lead Channel Impedance Value: 400 Ohm
Lead Channel Pacing Threshold Amplitude: 0.625 V
Lead Channel Setting Pacing Amplitude: 1.5 V
Lead Channel Setting Pacing Amplitude: 2 V
Lead Channel Setting Sensing Sensitivity: 2.8 mV
MDC IDC MSMT BATTERY IMPEDANCE: 250 Ohm
MDC IDC MSMT LEADCHNL RA PACING THRESHOLD AMPLITUDE: 0.375 V
MDC IDC MSMT LEADCHNL RA PACING THRESHOLD PULSEWIDTH: 0.4 ms
MDC IDC MSMT LEADCHNL RV IMPEDANCE VALUE: 500 Ohm
MDC IDC MSMT LEADCHNL RV PACING THRESHOLD PULSEWIDTH: 0.4 ms
MDC IDC SESS DTM: 20160517171535
MDC IDC SET LEADCHNL RV PACING PULSEWIDTH: 0.4 ms
MDC IDC STAT BRADY AP VS PERCENT: 0 %

## 2014-06-30 NOTE — Telephone Encounter (Signed)
Rx(s) sent to pharmacy electronically.  

## 2014-07-06 ENCOUNTER — Encounter: Payer: Self-pay | Admitting: Cardiology

## 2014-07-16 ENCOUNTER — Other Ambulatory Visit: Payer: Self-pay | Admitting: Cardiovascular Disease

## 2014-07-17 NOTE — Telephone Encounter (Signed)
Rx has been sent to the pharmacy electronically. ° °

## 2014-07-21 ENCOUNTER — Encounter: Payer: Self-pay | Admitting: Cardiology

## 2014-07-24 DIAGNOSIS — H3532 Exudative age-related macular degeneration: Secondary | ICD-10-CM | POA: Diagnosis not present

## 2014-07-25 ENCOUNTER — Encounter: Payer: Self-pay | Admitting: Cardiovascular Disease

## 2014-08-30 DIAGNOSIS — H3532 Exudative age-related macular degeneration: Secondary | ICD-10-CM | POA: Diagnosis not present

## 2014-09-20 DIAGNOSIS — H547 Unspecified visual loss: Secondary | ICD-10-CM | POA: Diagnosis not present

## 2014-09-20 DIAGNOSIS — H43821 Vitreomacular adhesion, right eye: Secondary | ICD-10-CM | POA: Diagnosis not present

## 2014-09-27 ENCOUNTER — Ambulatory Visit (INDEPENDENT_AMBULATORY_CARE_PROVIDER_SITE_OTHER): Payer: Medicare Other | Admitting: *Deleted

## 2014-09-27 DIAGNOSIS — I442 Atrioventricular block, complete: Secondary | ICD-10-CM | POA: Diagnosis not present

## 2014-09-27 NOTE — Progress Notes (Signed)
Remote pacemaker transmission.   

## 2014-09-28 DIAGNOSIS — Z09 Encounter for follow-up examination after completed treatment for conditions other than malignant neoplasm: Secondary | ICD-10-CM | POA: Diagnosis not present

## 2014-09-28 DIAGNOSIS — H43821 Vitreomacular adhesion, right eye: Secondary | ICD-10-CM | POA: Diagnosis not present

## 2014-10-04 LAB — CUP PACEART REMOTE DEVICE CHECK
Battery Impedance: 274 Ohm
Battery Voltage: 2.79 V
Brady Statistic AP VP Percent: 83 %
Brady Statistic AP VS Percent: 0 %
Brady Statistic AS VS Percent: 0 %
Date Time Interrogation Session: 20160817125451
Lead Channel Impedance Value: 406 Ohm
Lead Channel Impedance Value: 480 Ohm
Lead Channel Pacing Threshold Pulse Width: 0.4 ms
Lead Channel Setting Pacing Amplitude: 1.5 V
Lead Channel Setting Pacing Amplitude: 2 V
Lead Channel Setting Pacing Pulse Width: 0.4 ms
Lead Channel Setting Sensing Sensitivity: 2.8 mV
MDC IDC MSMT BATTERY REMAINING LONGEVITY: 92 mo
MDC IDC MSMT LEADCHNL RA PACING THRESHOLD AMPLITUDE: 0.375 V
MDC IDC MSMT LEADCHNL RV PACING THRESHOLD AMPLITUDE: 0.625 V
MDC IDC MSMT LEADCHNL RV PACING THRESHOLD PULSEWIDTH: 0.4 ms
MDC IDC STAT BRADY AS VP PERCENT: 17 %

## 2014-10-06 DIAGNOSIS — H3532 Exudative age-related macular degeneration: Secondary | ICD-10-CM | POA: Diagnosis not present

## 2014-10-20 ENCOUNTER — Ambulatory Visit (INDEPENDENT_AMBULATORY_CARE_PROVIDER_SITE_OTHER): Payer: Medicare Other | Admitting: Emergency Medicine

## 2014-10-20 ENCOUNTER — Ambulatory Visit (INDEPENDENT_AMBULATORY_CARE_PROVIDER_SITE_OTHER): Payer: Medicare Other

## 2014-10-20 VITALS — BP 172/74 | HR 75 | Temp 98.2°F | Resp 44 | Ht 64.25 in | Wt 137.0 lb

## 2014-10-20 DIAGNOSIS — R05 Cough: Secondary | ICD-10-CM

## 2014-10-20 DIAGNOSIS — R0602 Shortness of breath: Secondary | ICD-10-CM | POA: Diagnosis not present

## 2014-10-20 DIAGNOSIS — I2510 Atherosclerotic heart disease of native coronary artery without angina pectoris: Secondary | ICD-10-CM | POA: Diagnosis not present

## 2014-10-20 DIAGNOSIS — J441 Chronic obstructive pulmonary disease with (acute) exacerbation: Secondary | ICD-10-CM

## 2014-10-20 LAB — COMPREHENSIVE METABOLIC PANEL
ALT: 13 U/L (ref 9–46)
AST: 19 U/L (ref 10–35)
Albumin: 3.6 g/dL (ref 3.6–5.1)
Alkaline Phosphatase: 87 U/L (ref 40–115)
BUN: 17 mg/dL (ref 7–25)
CO2: 29 mmol/L (ref 20–31)
Calcium: 8.9 mg/dL (ref 8.6–10.3)
Chloride: 100 mmol/L (ref 98–110)
Creat: 0.94 mg/dL (ref 0.70–1.11)
GLUCOSE: 101 mg/dL — AB (ref 65–99)
Potassium: 4.6 mmol/L (ref 3.5–5.3)
Sodium: 140 mmol/L (ref 135–146)
Total Bilirubin: 0.8 mg/dL (ref 0.2–1.2)
Total Protein: 6.5 g/dL (ref 6.1–8.1)

## 2014-10-20 LAB — POCT CBC
Granulocyte percent: 79.5 %G (ref 37–80)
HCT, POC: 42 % — AB (ref 43.5–53.7)
Hemoglobin: 13.9 g/dL — AB (ref 14.1–18.1)
Lymph, poc: 1.3 (ref 0.6–3.4)
MCH, POC: 30.6 pg (ref 27–31.2)
MCHC: 33.1 g/dL (ref 31.8–35.4)
MCV: 92.5 fL (ref 80–97)
MID (CBC): 0.7 (ref 0–0.9)
MPV: 7.5 fL (ref 0–99.8)
POC Granulocyte: 7.7 — AB (ref 2–6.9)
POC LYMPH PERCENT: 13 %L (ref 10–50)
POC MID %: 7.5 % (ref 0–12)
Platelet Count, POC: 182 10*3/uL (ref 142–424)
RBC: 4.54 M/uL — AB (ref 4.69–6.13)
RDW, POC: 13.9 %
WBC: 9.7 10*3/uL (ref 4.6–10.2)

## 2014-10-20 MED ORDER — LEVOFLOXACIN 500 MG PO TABS: 500.0000 mg | ORAL_TABLET | Freq: Every day | ORAL | Status: AC

## 2014-10-20 MED ORDER — ALBUTEROL SULFATE HFA 108 (90 BASE) MCG/ACT IN AERS: 2.0000 | INHALATION_SPRAY | RESPIRATORY_TRACT | Status: DC | PRN

## 2014-10-20 MED ORDER — GLYCOPYRROLATE-FORMOTEROL 9-4.8 MCG/ACT IN AERO: 2.0000 | INHALATION_SPRAY | Freq: Two times a day (BID) | RESPIRATORY_TRACT | Status: DC

## 2014-10-20 MED ORDER — IPRATROPIUM BROMIDE 0.02 % IN SOLN
0.5000 mg | Freq: Once | RESPIRATORY_TRACT | Status: AC
  Administered 2014-10-20: 0.5 mg via RESPIRATORY_TRACT

## 2014-10-20 MED ORDER — ALBUTEROL SULFATE (2.5 MG/3ML) 0.083% IN NEBU
2.5000 mg | INHALATION_SOLUTION | Freq: Once | RESPIRATORY_TRACT | Status: AC
  Administered 2014-10-20: 2.5 mg via RESPIRATORY_TRACT

## 2014-10-20 NOTE — Patient Instructions (Signed)
Metered Dose Inhaler (No Spacer Used)  Inhaled medicines are the basis of treatment for asthma and other breathing problems. Inhaled medicine can only be effective if used properly. Good technique assures that the medicine reaches the lungs.  Metered dose inhalers (MDIs) are used to deliver a variety of inhaled medicines. These include quick relief or rescue medicines (such as bronchodilators) and controller medicines (such as corticosteroids). The medicine is delivered by pushing down on a metal canister to release a set amount of spray.  If you are using different kinds of inhalers, use your quick relief medicine to open the airways 10-15 minutes before using a steroid, if instructed to do so by your health care provider. If you are unsure which inhalers to use and the order of using them, ask your health care provider, nurse, or respiratory therapist.  HOW TO USE THE INHALER  1. Remove the cap from the inhaler.  2. If you are using the inhaler for the first time, you will need to prime it. Shake the inhaler for 5 seconds and release four puffs into the air, away from your face. Ask your health care provider or pharmacist if you have questions about priming your inhaler.  3. Shake the inhaler for 5 seconds before each breath in (inhalation).  4. Position the inhaler so that the top of the canister faces up.  5. Put your index finger on the top of the medicine canister. Your thumb supports the bottom of the inhaler.  6. Open your mouth.  7. Either place the inhaler between your teeth and place your lips tightly around the mouthpiece, or hold the inhaler 1-2 inches away from your open mouth. If you are unsure of which technique to use, ask your health care provider.  8. Breathe out (exhale) normally and as completely as possible.  9. Press the canister down with the index finger to release the medicine.  10. At the same time as the canister is pressed, inhale deeply and slowly until your lungs are completely filled.  This should take 4-6 seconds. Keep your tongue down.  11. Hold the medicine in your lungs for 5-10 seconds (10 seconds is best). This helps the medicine get into the small airways of your lungs.  12. Breathe out slowly, through pursed lips. Whistling is an example of pursed lips.  13. Wait at least 1 minute between puffs. Continue with the above steps until you have taken the number of puffs your health care provider has ordered. Do not use the inhaler more than your health care provider directs you to.  14. Replace the cap on the inhaler.  15. Follow the directions from your health care provider or the inhaler insert for cleaning the inhaler.  If you are using a steroid inhaler, after your last puff, rinse your mouth with water, gargle, and spit out the water. Do not swallow the water.  AVOID:  · Inhaling before or after starting the spray of medicine. It takes practice to coordinate your breathing with triggering the spray.  · Inhaling through the nose (rather than the mouth) when triggering the spray.  HOW TO DETERMINE IF YOUR INHALER IS FULL OR NEARLY EMPTY  You cannot know when an inhaler is empty by shaking it. Some inhalers are now being made with dose counters. Ask your health care provider for a prescription that has a dose counter if you feel you need that extra help. If your inhaler does not have a counter, ask your health care   provider to help you determine the date you need to refill your inhaler. Write the refill date on a calendar or your inhaler canister. Refill your inhaler 7-10 days before it runs out. Be sure to keep an adequate supply of medicine. This includes making sure it has not expired, and making sure you have a spare inhaler.  SEEK MEDICAL CARE IF:  · Symptoms are only partially relieved with your inhaler.  · You are having trouble using your inhaler.  · You experience an increase in phlegm.  SEEK IMMEDIATE MEDICAL CARE IF:  · You feel little or no relief with your inhalers. You are still  wheezing and feeling shortness of breath, tightness in your chest, or both.  · You have dizziness, headaches, or a fast heart rate.  · You have chills, fever, or night sweats.  · There is a noticeable increase in phlegm production, or there is blood in the phlegm.  MAKE SURE YOU:  · Understand these instructions.  · Will watch your condition.  · Will get help right away if you are not doing well or get worse.  Document Released: 11/24/2006 Document Revised: 06/13/2013 Document Reviewed: 07/15/2012  ExitCare® Patient Information ©2015 [employer]. This information is not intended to replace advice given to you by your health care provider. Make sure you discuss any questions you have with your health care provider.

## 2014-10-20 NOTE — Progress Notes (Signed)
Subjective:  Patient ID: Luis Townsend, male    DOB: 1928-03-07  Age: 81 y.o. MRN: [id number]  CC: Shortness of Breath; Sinusitis; Chest feels raw; and Cough   HPI ALRICK CUBBAGE presents  with a week's duration decreased exercise tolerance cough and wheezing. His cough is productive of purulent sputum. No fever or chill. He has no chest pain tightness heaviness or pressure. He has no peripheral edema. He becomes more short of breath when he lays flat. He's had no improvement with over-the-counter medication  History Mindy has a past medical history of Myocardial infarction (03/22/1994); Hypertension; Hyperlipidemia; S/P CABG x 5 (03/28/1994); Cerebrovascular disease (06/30/2011); S/P carotid endarterectomy (10/28/1996); Subclavian artery stenosis, left (06/30/2011); Aortoiliac occlusive disease (06/30/2011); Aortic stenosis (06/30/2011); Amaurosis fugax of left eye (10/11/1996); Tobacco abuse; COPD (chronic obstructive pulmonary disease), moderat to severe (07/29/2011); Coronary artery disease; PAD (peripheral artery disease); RBBB (right bundle branch block); and CHB (complete heart block) (02/03/2013).   He has past surgical history that includes Carotid endarterectomy (10/28/96  DR. TODD EARLY); Coronary artery bypass graft (03/28/94 DR. CHARLES WILSON); Pacemaker insertion (11/12/2011); CAROTID DOPPLER (10/18/2012); PERIPHERAL VASCULAR ANGIOPLASTY (07/29/2011); Cardiac catheterization (06/24/2011); Cardiovascular stress test (07/10/2009); transthoracic echocardiogram (08/05/2012); left heart catheterization with coronary/graft angiogram (N/A, 06/24/2011); bilateral upper extremity angiogram (N/A, 07/29/2011); Cardiac valve replacement; and Eye surgery.   His  family history includes Diabetes in his maternal grandfather; Heart attack in his father and paternal grandmother; Heart disease in his father; Pneumonia in his maternal grandmother; Stroke in his mother.  He   reports that he quit smoking about 3 years  ago. He has never used smokeless tobacco. He reports that he does not drink alcohol or use illicit drugs.  Outpatient Prescriptions Prior to Visit  Medication Sig Dispense Refill  . albuterol (PROVENTIL HFA;VENTOLIN HFA) 108 (90 BASE) MCG/ACT inhaler Inhale 2 puffs into the lungs every 6 (six) hours as needed for wheezing or shortness of breath. 1 Inhaler 0  . aspirin EC 81 MG tablet Take 81 mg by mouth daily.    . carvedilol (COREG) 6.25 MG tablet TAKE 1 TABLET (6.25 MG TOTAL) BY MOUTH 2 (TWO) TIMES DAILY WITH A MEAL. 60 tablet 7  . cholecalciferol (VITAMIN D) 1000 UNITS tablet Take 1,000 Units by mouth daily.    . ramipril (ALTACE) 10 MG capsule TAKE 1 CAPSULE (10 MG TOTAL) BY MOUTH DAILY. 30 capsule 10  . rosuvastatin (CRESTOR) 40 MG tablet Take 1 tablet (40 mg total) by mouth daily. 30 tablet 6  . fish oil-omega-3 fatty acids 1000 MG capsule Take 1 g by mouth daily.     No facility-administered medications prior to visit.    Social History   Social History  . Marital Status: Single    Spouse Name: N/A  . Number of Children: N/A  . Years of Education: N/A   Social History Main Topics  . Smoking status: Former Smoker    Quit date: 03/02/2011  . Smokeless tobacco: Never Used  . Alcohol Use: No  . Drug Use: No  . Sexual Activity: Not Asked   Other Topics Concern  . None   Social History Narrative     Review of Systems  Constitutional: Negative for fever, chills and appetite change.  HENT: Positive for congestion. Negative for ear pain, postnasal drip, sinus pressure and sore throat.   Eyes: Negative for pain and redness.  Respiratory: Positive for shortness of breath and wheezing. Negative for cough.   Cardiovascular: Negative for leg swelling.  Gastrointestinal: Negative for nausea, vomiting, abdominal pain, diarrhea, constipation and blood in stool.  Endocrine: Negative for polyuria.  Genitourinary: Negative for dysuria, urgency, frequency and flank pain.    Musculoskeletal: Negative for gait problem.  Skin: Negative for rash.  Neurological: Negative for weakness and headaches.  Psychiatric/Behavioral: Negative for confusion and decreased concentration. The patient is not nervous/anxious.     Objective:  BP 172/74 mmHg  Pulse 75  Temp(Src) 98.2 F (36.8 C) (Oral)  Resp 44  Ht 5' 4.25" (1.632 m)  Wt 137 lb (62.143 kg)  BMI 23.33 kg/m2  SpO2 93%  Physical Exam  Constitutional: He is oriented to person, place, and time. He appears well-developed and well-nourished. No distress.  HENT:  Head: Normocephalic and atraumatic.  Right Ear: External ear normal.  Left Ear: External ear normal.  Nose: Nose normal.  Eyes: Conjunctivae and EOM are normal. Pupils are equal, round, and reactive to light. No scleral icterus.  Neck: Normal range of motion. Neck supple. No tracheal deviation present.  Cardiovascular: Normal rate, regular rhythm and normal heart sounds.   Pulmonary/Chest: Accessory muscle usage present. Tachypnea noted. No respiratory distress. He has decreased breath sounds in the right lower field and the left lower field. He has rales in the right lower field and the left lower field.  Abdominal: He exhibits no mass. There is no tenderness. There is no rebound and no guarding.  Musculoskeletal: He exhibits no edema.  Lymphadenopathy:    He has no cervical adenopathy.  Neurological: He is alert and oriented to person, place, and time. Coordination normal.  Skin: Skin is warm and dry. No rash noted.  Psychiatric: He has a normal mood and affect. His behavior is normal.      Assessment & Plan:   Kern was seen today for shortness of breath, sinusitis, chest feels raw and cough.  Diagnoses and all orders for this visit:  Shortness of breath -     POCT CBC -     Comprehensive metabolic panel -     DG Chest 2 View; Future -     EKG 12-Lead -     albuterol (PROVENTIL) (2.5 MG/3ML) 0.083% nebulizer solution 2.5 mg; Take 3 mLs  (2.5 mg total) by nebulization once. -     ipratropium (ATROVENT) nebulizer solution 0.5 mg; Take 2.5 mLs (0.5 mg total) by nebulization once.  COPD exacerbation  Other orders -     levofloxacin (LEVAQUIN) 500 MG tablet; Take 1 tablet (500 mg total) by mouth daily. -     albuterol (PROVENTIL HFA;VENTOLIN HFA) 108 (90 BASE) MCG/ACT inhaler; Inhale 2 puffs into the lungs every 4 (four) hours as needed for wheezing or shortness of breath (cough, shortness of breath or wheezing.). -     Glycopyrrolate-Formoterol 9-4.8 MCG/ACT AERO; Inhale 2 puffs into the lungs 2 (two) times daily.   I am having Mr. Siegman start on levofloxacin, albuterol, and Glycopyrrolate-Formoterol. I am also having him maintain his aspirin EC, fish oil-omega-3 fatty acids, cholecalciferol, albuterol, ramipril, rosuvastatin, and carvedilol. We administered albuterol and ipratropium.  Meds ordered this encounter  Medications  . albuterol (PROVENTIL) (2.5 MG/3ML) 0.083% nebulizer solution 2.5 mg    Sig:   . ipratropium (ATROVENT) nebulizer solution 0.5 mg    Sig:   . levofloxacin (LEVAQUIN) 500 MG tablet    Sig: Take 1 tablet (500 mg total) by mouth daily.    Dispense:  7 tablet    Refill:  0  . albuterol (PROVENTIL HFA;VENTOLIN  HFA) 108 (90 BASE) MCG/ACT inhaler    Sig: Inhale 2 puffs into the lungs every 4 (four) hours as needed for wheezing or shortness of breath (cough, shortness of breath or wheezing.).    Dispense:  1 Inhaler    Refill:  12  . Glycopyrrolate-Formoterol 9-4.8 MCG/ACT AERO    Sig: Inhale 2 puffs into the lungs 2 (two) times daily.    Dispense:  1 Inhaler    Refill:  12   He had modest improvement in his respiratory rate and sense of well-being with an albuterol and Atrovent nebulized aerosol treatment he had better exercise tolerance as well. He was discharged on albuterol and a long-acting bronchodilator.  Appropriate red flag conditions were discussed with the patient as well as actions that  should be taken.  Patient expressed his understanding.  Follow-up: Return if symptoms worsen or fail to improve.  Roselee Culver, MD   [facility] reading (PRIMARY) by  Dr. Ouida Sills  No acute change to COPD.

## 2014-10-23 ENCOUNTER — Encounter: Payer: Self-pay | Admitting: Cardiology

## 2014-10-25 ENCOUNTER — Telehealth: Payer: Self-pay

## 2014-10-25 NOTE — Telephone Encounter (Signed)
Pharm faxed note saying they can not order the glycopyrolate-formoterol inh and would like for Dr Ouida Sills to send in an alternative.

## 2014-10-28 ENCOUNTER — Other Ambulatory Visit: Payer: Self-pay | Admitting: Cardiovascular Disease

## 2014-10-30 NOTE — Telephone Encounter (Signed)
Rx(s) sent to pharmacy electronically.  

## 2014-11-03 ENCOUNTER — Encounter: Payer: Self-pay | Admitting: Cardiovascular Disease

## 2014-11-23 DIAGNOSIS — H353211 Exudative age-related macular degeneration, right eye, with active choroidal neovascularization: Secondary | ICD-10-CM | POA: Diagnosis not present

## 2014-11-29 ENCOUNTER — Ambulatory Visit (HOSPITAL_COMMUNITY)
Admission: RE | Admit: 2014-11-29 | Discharge: 2014-11-29 | Disposition: A | Discharge status: Home / Self Care | Payer: Medicare Other | Source: Ambulatory Visit | Attending: Cardiovascular Disease | Admitting: Cardiovascular Disease

## 2014-11-29 DIAGNOSIS — I739 Peripheral vascular disease, unspecified: Secondary | ICD-10-CM

## 2014-11-29 DIAGNOSIS — I10 Essential (primary) hypertension: Secondary | ICD-10-CM | POA: Diagnosis not present

## 2014-11-29 DIAGNOSIS — I779 Disorder of arteries and arterioles, unspecified: Secondary | ICD-10-CM | POA: Diagnosis not present

## 2014-11-29 DIAGNOSIS — Z9889 Other specified postprocedural states: Secondary | ICD-10-CM | POA: Insufficient documentation

## 2014-11-29 DIAGNOSIS — E785 Hyperlipidemia, unspecified: Secondary | ICD-10-CM | POA: Insufficient documentation

## 2014-11-29 DIAGNOSIS — I6523 Occlusion and stenosis of bilateral carotid arteries: Secondary | ICD-10-CM | POA: Insufficient documentation

## 2015-01-01 ENCOUNTER — Ambulatory Visit (INDEPENDENT_AMBULATORY_CARE_PROVIDER_SITE_OTHER): Payer: Medicare Other | Admitting: *Deleted

## 2015-01-01 ENCOUNTER — Telehealth: Payer: Self-pay | Admitting: Cardiology

## 2015-01-01 DIAGNOSIS — R001 Bradycardia, unspecified: Secondary | ICD-10-CM | POA: Diagnosis not present

## 2015-01-01 LAB — CUP PACEART REMOTE DEVICE CHECK
Battery Impedance: 324 Ohm
Battery Voltage: 2.79 V
Brady Statistic AP VS Percent: 0 %
Brady Statistic AS VP Percent: 17 %
Implantable Lead Implant Date: 20131002
Implantable Lead Location: 753859
Lead Channel Pacing Threshold Amplitude: 0.375 V
Lead Channel Pacing Threshold Amplitude: 0.75 V
Lead Channel Pacing Threshold Pulse Width: 0.4 ms
Lead Channel Pacing Threshold Pulse Width: 0.4 ms
Lead Channel Setting Pacing Amplitude: 2 V
Lead Channel Setting Pacing Pulse Width: 0.4 ms
Lead Channel Setting Sensing Sensitivity: 2.8 mV
MDC IDC LEAD IMPLANT DT: 20131002
MDC IDC LEAD LOCATION: 753860
MDC IDC MSMT BATTERY REMAINING LONGEVITY: 88 mo
MDC IDC MSMT LEADCHNL RA IMPEDANCE VALUE: 400 Ohm
MDC IDC MSMT LEADCHNL RV IMPEDANCE VALUE: 508 Ohm
MDC IDC SESS DTM: 20161121200115
MDC IDC SET LEADCHNL RA PACING AMPLITUDE: 1.5 V
MDC IDC STAT BRADY AP VP PERCENT: 83 %
MDC IDC STAT BRADY AS VS PERCENT: 0 %

## 2015-01-01 NOTE — Telephone Encounter (Signed)
Spoke with pt and reminded pt of remote transmission that is due today. Pt verbalized understanding.   

## 2015-01-02 NOTE — Progress Notes (Signed)
Remote pacemaker transmission.   

## 2015-01-16 DIAGNOSIS — Z23 Encounter for immunization: Secondary | ICD-10-CM | POA: Diagnosis not present

## 2015-01-18 DIAGNOSIS — H353211 Exudative age-related macular degeneration, right eye, with active choroidal neovascularization: Secondary | ICD-10-CM | POA: Diagnosis not present

## 2015-02-11 HISTORY — PX: CATARACT EXTRACTION: SUR2

## 2015-03-05 DIAGNOSIS — H353211 Exudative age-related macular degeneration, right eye, with active choroidal neovascularization: Secondary | ICD-10-CM | POA: Diagnosis not present

## 2015-03-05 DIAGNOSIS — H353134 Nonexudative age-related macular degeneration, bilateral, advanced atrophic with subfoveal involvement: Secondary | ICD-10-CM | POA: Diagnosis not present

## 2015-03-15 ENCOUNTER — Encounter: Payer: Self-pay | Admitting: Cardiology

## 2015-03-30 ENCOUNTER — Encounter: Payer: Self-pay | Admitting: Cardiology

## 2015-04-11 ENCOUNTER — Encounter: Payer: Self-pay | Admitting: *Deleted

## 2015-04-18 DIAGNOSIS — H353134 Nonexudative age-related macular degeneration, bilateral, advanced atrophic with subfoveal involvement: Secondary | ICD-10-CM | POA: Diagnosis not present

## 2015-04-18 DIAGNOSIS — H353211 Exudative age-related macular degeneration, right eye, with active choroidal neovascularization: Secondary | ICD-10-CM | POA: Diagnosis not present

## 2015-04-25 ENCOUNTER — Encounter: Payer: Self-pay | Admitting: Adult Health

## 2015-04-25 ENCOUNTER — Ambulatory Visit (INDEPENDENT_AMBULATORY_CARE_PROVIDER_SITE_OTHER): Payer: Medicare Other | Admitting: Adult Health

## 2015-04-25 VITALS — BP 122/80 | Temp 98.6°F | Ht 64.25 in | Wt 143.8 lb

## 2015-04-25 DIAGNOSIS — J449 Chronic obstructive pulmonary disease, unspecified: Secondary | ICD-10-CM

## 2015-04-25 DIAGNOSIS — Z7189 Other specified counseling: Secondary | ICD-10-CM

## 2015-04-25 DIAGNOSIS — I10 Essential (primary) hypertension: Secondary | ICD-10-CM

## 2015-04-25 DIAGNOSIS — Z7689 Persons encountering health services in other specified circumstances: Secondary | ICD-10-CM

## 2015-04-25 NOTE — Patient Instructions (Signed)
It was great meeting you today!  Please follow up with me for your physical. If you need anything in the meantime, please let me know.

## 2015-04-25 NOTE — Progress Notes (Signed)
Pre visit review using our clinic review tool, if applicable. No additional management support is needed unless otherwise documented below in the visit note. 

## 2015-04-25 NOTE — Progress Notes (Signed)
Patient presents to clinic today to establish care. He is a pleasant and active 82 year old male who has an extensive cardiac history including TAVR procedure. He  has a past medical history of Myocardial infarction ([place]) (03/22/1994); Hypertension; Hyperlipidemia; S/P CABG x 5 (03/28/1994); Cerebrovascular disease (06/30/2011); S/P carotid endarterectomy (10/28/1996); Subclavian artery stenosis, left (06/30/2011); Aortoiliac occlusive disease ([place]) (06/30/2011); Aortic stenosis (06/30/2011); Amaurosis fugax of left eye (10/11/1996); Tobacco abuse; COPD (chronic obstructive pulmonary disease), moderat to severe (07/29/2011); Coronary artery disease; PAD (peripheral artery disease) ([place]); RBBB (right bundle branch block); and CHB (complete heart block) ([place]) (02/03/2013).  He has past surgical history that includes Carotid endarterectomy (10/28/96 DR. TODD EARLY); Coronary artery bypass graft (03/28/94 DR. CHARLES WILSON); Pacemaker insertion (11/12/2011); CAROTID DOPPLER (10/18/2012); PERIPHERAL VASCULAR ANGIOPLASTY (07/29/2011); Cardiac catheterization (06/24/2011); Cardiovascular stress test (07/10/2009); transthoracic echocardiogram (08/05/2012); left heart catheterization with coronary/graft angiogram (N/A, 06/24/2011); bilateral upper extremity angiogram (N/A, 07/29/2011); Cardiac valve replacement; and Eye surgery.   His last physical was unknown  Acute Concerns: Establish Care  Chronic Issues: Cardiac - feels as though his cardiac issues are well controlled. He sees cardiology every 6 months   Health Maintenance: Dental -- Twice a year Vision -- Yearly  Immunizations -- UTD Colonoscopy -- Not needed any more  Is Followed by:  Cardiology - Dr. Sallyanne Kuster - Every six months [facility] - Yearly   Past Medical History  Diagnosis Date  . Myocardial infarction ([place]) 03/22/1994    acute LAD occlusion  . Hypertension   . Hyperlipidemia   . S/P CABG x 5 03/28/1994    LIMA to LAD, sequential SVG to  OM1-OM2-OM4, SVG to RCA, open vein harvest right leg - Dr Redmond Pulling  . Cerebrovascular disease 06/30/2011  . S/P carotid endarterectomy 10/28/1996    Dr Donnetta Hutching  . Subclavian artery stenosis, left 06/30/2011  . Aortoiliac occlusive disease ([place]) 06/30/2011  . Aortic stenosis 06/30/2011    severe  . Amaurosis fugax of left eye 10/11/1996  . Tobacco abuse   . COPD (chronic obstructive pulmonary disease), moderat to severe 07/29/2011  . Coronary artery disease   . PAD (peripheral artery disease) ([place])   . RBBB (right bundle branch block)   . CHB (complete heart block) ([place]) 02/03/2013    Past Surgical History  Procedure Laterality Date  . Carotid endarterectomy  10/28/96  DR. TODD EARLY    LEFT AND DACRON PATCH ANGIOPLASTY  . Coronary artery bypass graft  03/28/94 DR. CHARLES WILSON    x5(LIMA to LAD,SVG to OM1-OM2-OM4, SVG to RCA  . Pacemaker insertion  11/12/2011    Medtronic Adapta; model# ADDR01, serial# [id number]  . Carotid doppler  10/18/2012    Rt bulb/prox ICA demonstrated 50-69% diameter reduction, Lft mid ICA demonstrated 50-69% diameter reduction, Lft subclavian- = to or <50% sig diameter reduction, Lft vertebral-appeared occluded  . Peripheral vascular angioplasty  07/29/2011    Lft Subclavian 90% stenosis, stented w/ a 7x45m long Cordis Genesis resulting in reduction of 90% stenosos to 0% resdiual.  . Cardiac catheterization  06/24/2011    No intervention - Recommendation-angioplasty/stenting of Lft subclavian artery, high risk replacement of aortic valve w/ biological prosthesis and redo CABG w/ preservation of LIMA bypass and new SVG to PDA and OM, reevaluate carotid arteries and pulmonary function prior to surgery.  . Cardiovascular stress test  07/10/2009    Evidence of mild ischemia in Basal Anteriolateral and Mid Anterolateral regions-additional diaphragmatic attenuation. No ECG changes. Low risk.\  . Transthoracic echocardiogram  08/05/2012    EF 50-55%, septial motion showed abnormal  function and dyssynergy, LA mild-moderately dilated  . Left heart catheterization with coronary/graft angiogram N/A 06/24/2011    Procedure: LEFT HEART CATHETERIZATION WITH Beatrix Fetters;  Surgeon: Sanda Klein, MD;  Location: [facility] CATH LAB;  Service: Cardiovascular;  Laterality: N/A;  . Bilateral upper extremity angiogram N/A 07/29/2011    Procedure: BILATERAL UPPER EXTREMITY ANGIOGRAM;  Surgeon: Lorretta Harp, MD;  [facility] C[facility];  Service: Cardiovascular;  Laterality: N/A;  . Cardiac valve replacement    . Eye surgery      Current Outpatient Prescriptions on File Prior to Visit  Medication Sig Dispense Refill  . albuterol (PROVENTIL HFA;VENTOLIN HFA) 108 (90 BASE) MCG/ACT inhaler Inhale 2 puffs into the lungs every 6 (six) hours as needed for wheezing or shortness of breath. 1 Inhaler 0  . aspirin EC 81 MG tablet Take 81 mg by mouth daily.    . carvedilol (COREG) 6.25 MG tablet TAKE 1 TABLET (6.25 MG TOTAL) BY MOUTH 2 (TWO) TIMES DAILY WITH A MEAL. 60 tablet 7  . Glycopyrrolate-Formoterol 9-4.8 MCG/ACT AERO Inhale 2 puffs into the lungs 2 (two) times daily. 1 Inhaler 12  . ramipril (ALTACE) 10 MG capsule TAKE 1 CAPSULE (10 MG TOTAL) BY MOUTH DAILY. 30 capsule 10  . rosuvastatin (CRESTOR) 40 MG tablet Take 1 tablet (40 mg total) by mouth daily. 30 tablet 6   No current facility-administered medications on file prior to visit.    No Known Allergies  Family History  Problem Relation Age of Onset  . Stroke Mother   . Heart attack Father   . Heart disease Father   . Pneumonia Maternal Grandmother   . Diabetes Maternal Grandfather   . Heart attack Paternal Grandmother     Social History   Social History  . Marital Status: Single    Spouse Name: N/A  . Number of Children: N/A  . Years of Education: N/A   Occupational History  . Not on file.   Social History Main Topics  . Smoking status: Former Smoker    Quit date: 03/02/2011  . Smokeless tobacco: Never  Used  . Alcohol Use: No  . Drug Use: No  . Sexual Activity: Not on file   Other Topics Concern  . Not on file   Social History Narrative    Review of Systems  Constitutional: Negative.   Eyes: Negative.   Respiratory: Negative.   Cardiovascular: Negative.   Gastrointestinal: Negative.   Genitourinary: Negative.   Musculoskeletal: Negative.   Skin: Negative.   Neurological: Negative.   Endo/Heme/Allergies: Negative.   All other systems reviewed and are negative.   BP 122/80 mmHg  Temp(Src) 98.6 F (37 C) (Oral)  Ht 5' 4.25" (1.632 m)  Wt 143 lb 12.8 oz (65.227 kg)  BMI 24.49 kg/m2  Physical Exam  Constitutional: He is oriented to person, place, and time and well-developed, well-nourished, and in no distress. No distress.  HENT:  Head: Normocephalic and atraumatic.  Right Ear: External ear normal.  Left Ear: External ear normal.  Nose: Nose normal.  Mouth/Throat: Oropharynx is clear and moist. No oropharyngeal exudate.  Cardiovascular: Normal rate, regular rhythm, normal heart sounds and intact distal pulses.  Exam reveals no gallop and no friction rub.   No murmur heard. Pulmonary/Chest: Effort normal and breath sounds normal. No respiratory distress. He has no wheezes. He has no rales. He exhibits no tenderness.  Neurological: He is alert and oriented to  person, place, and time. Gait normal. GCS score is 15.  Skin: Skin is warm and dry. No rash noted. He is not diaphoretic. No erythema.  Psychiatric: Mood, memory, affect and judgment normal.  Vitals reviewed.   Assessment/Plan: 1. Establishing care with new doctor, encounter for - Follow up with CPE  - Follow up sooner if needed - He does not need any medications refilled.  - Reviewed latest labs.  2. Essential hypertension - Controlled with current medication - no change  3. Chronic obstructive pulmonary disease, unspecified COPD type ([place]) - Controlled with current medications.

## 2015-05-17 DIAGNOSIS — H353132 Nonexudative age-related macular degeneration, bilateral, intermediate dry stage: Secondary | ICD-10-CM | POA: Diagnosis not present

## 2015-05-17 DIAGNOSIS — H2512 Age-related nuclear cataract, left eye: Secondary | ICD-10-CM | POA: Diagnosis not present

## 2015-05-17 DIAGNOSIS — Z961 Presence of intraocular lens: Secondary | ICD-10-CM | POA: Diagnosis not present

## 2015-06-13 DIAGNOSIS — H353211 Exudative age-related macular degeneration, right eye, with active choroidal neovascularization: Secondary | ICD-10-CM | POA: Diagnosis not present

## 2015-06-13 DIAGNOSIS — H353134 Nonexudative age-related macular degeneration, bilateral, advanced atrophic with subfoveal involvement: Secondary | ICD-10-CM | POA: Diagnosis not present

## 2015-06-30 ENCOUNTER — Other Ambulatory Visit: Payer: Self-pay | Admitting: Cardiovascular Disease

## 2015-07-02 NOTE — Telephone Encounter (Signed)
Rx(s) sent to pharmacy electronically.  

## 2015-08-10 DIAGNOSIS — H353211 Exudative age-related macular degeneration, right eye, with active choroidal neovascularization: Secondary | ICD-10-CM | POA: Diagnosis not present

## 2015-08-10 DIAGNOSIS — H353134 Nonexudative age-related macular degeneration, bilateral, advanced atrophic with subfoveal involvement: Secondary | ICD-10-CM | POA: Diagnosis not present

## 2015-08-10 DIAGNOSIS — H35353 Cystoid macular degeneration, bilateral: Secondary | ICD-10-CM | POA: Diagnosis not present

## 2015-08-28 ENCOUNTER — Ambulatory Visit (INDEPENDENT_AMBULATORY_CARE_PROVIDER_SITE_OTHER): Payer: Medicare Other | Admitting: Cardiovascular Disease

## 2015-08-28 ENCOUNTER — Encounter: Payer: Self-pay | Admitting: Cardiovascular Disease

## 2015-08-28 VITALS — BP 128/71 | HR 70 | Ht 65.0 in | Wt 142.0 lb

## 2015-08-28 DIAGNOSIS — I350 Nonrheumatic aortic (valve) stenosis: Secondary | ICD-10-CM | POA: Diagnosis not present

## 2015-08-28 DIAGNOSIS — E785 Hyperlipidemia, unspecified: Secondary | ICD-10-CM

## 2015-08-28 DIAGNOSIS — I495 Sick sinus syndrome: Secondary | ICD-10-CM

## 2015-08-28 DIAGNOSIS — I442 Atrioventricular block, complete: Secondary | ICD-10-CM

## 2015-08-28 DIAGNOSIS — I771 Stricture of artery: Secondary | ICD-10-CM

## 2015-08-28 DIAGNOSIS — I708 Atherosclerosis of other arteries: Secondary | ICD-10-CM

## 2015-08-28 DIAGNOSIS — R001 Bradycardia, unspecified: Secondary | ICD-10-CM

## 2015-08-28 DIAGNOSIS — I6523 Occlusion and stenosis of bilateral carotid arteries: Secondary | ICD-10-CM

## 2015-08-28 DIAGNOSIS — Z79899 Other long term (current) drug therapy: Secondary | ICD-10-CM

## 2015-08-28 DIAGNOSIS — Z950 Presence of cardiac pacemaker: Secondary | ICD-10-CM

## 2015-08-28 DIAGNOSIS — I2510 Atherosclerotic heart disease of native coronary artery without angina pectoris: Secondary | ICD-10-CM

## 2015-08-28 LAB — CUP PACEART INCLINIC DEVICE CHECK
Date Time Interrogation Session: 20170718141942
Implantable Lead Implant Date: 20131002
Implantable Lead Location: 753860
Implantable Lead Model: 5076
Implantable Lead Model: 5076
Lead Channel Setting Pacing Amplitude: 1.5 V
Lead Channel Setting Pacing Pulse Width: 0.4 ms
Lead Channel Setting Sensing Sensitivity: 2.8 mV
MDC IDC LEAD IMPLANT DT: 20131002
MDC IDC LEAD LOCATION: 753859
MDC IDC SET LEADCHNL RV PACING AMPLITUDE: 2 V

## 2015-08-28 NOTE — Patient Instructions (Addendum)
Medication Instructions: Dr Sallyanne Kuster recommends that you continue on your current medications as directed. Please refer to the Current Medication list given to you today.  Labwork: Your physician recommends that you return for lab work at your earliest [place].  Testing/Procedures: 1. Carotid Artery Doppler - Your physician has requested that you have a carotid duplex. This test is an ultrasound of the carotid arteries in your neck. It looks at blood flow through these arteries that supply the brain with blood. Allow one hour for this exam. There are no restrictions or special instructions.  2. Echocardiogram - Your physician has requested that you have an echocardiogram. Echocardiography is a painless test that uses sound waves to create images of your heart. It provides your doctor with information about the size and shape of your heart and how well your heart's chambers and valves are working. This procedure takes approximately one hour. There are no restrictions for this procedure. This will be done at our [facility] location - [address], [address].  3. Remote Pacemaker Download - Remote monitoring is used to monitor your Pacemaker of ICD from home. This monitoring reduces the number of office visits required to check your device to one time per year. It allows [place] to keep an eye on the functioning of your device to ensure it is working properly. You are scheduled for a device check from home on Tuesday, October 17th, 2017. You may send your transmission at any time that day. If you have a wireless device, the transmission will be sent automatically. After your physician reviews your transmission, you will receive a postcard with your next transmission date.  Follow-up: Dr Sallyanne Kuster recommends that you schedule a follow-up appointment in 12 months with a pacemaker check. You will receive a reminder letter in the mail two months in advance. If you don't receive a letter, please  call our office to schedule the follow-up appointment.  If you need a refill on your cardiac medications before your next appointment, please call your pharmacy.

## 2015-08-28 NOTE — Progress Notes (Signed)
Cardiology Office Note    Date:  08/30/2015   ID:  Luis Townsend, DOB 1928/10/27, MRN [id number]  PCP:  Dorothyann Peng, NP  Cardiologist:   Sanda Klein, MD   Chief Complaint  Patient presents with  . Follow-up    pt denied chest pain and swelling in legs and feet    History of Present Illness:  Luis Townsend is a 82 y.o. male sinus bradycardia and complete heart block (pacemaker dependent), CAD S/P CABG, AS S/P TAVR, PAD with multiple vessel involvement (carotid stenosis S/P left carotid endarterectomy, S/P left subclavian stent), hyperlipidemia, hypertension and COPD here for follow-up.  He has done quite well and is reasonably active without complaints of exertional angina or dyspnea. He has noticed some dyspnea but this occurs during emotional stress rather than physical activity. He denies palpitations, wheezing, leg edema, bleeding problems, claudication or focal neurological events. He is no longer receiving follow-up at [facility] for his TAVR.  Pacemaker interrogation shows 82.5% atrial pacing and 100% ventricular pacing without evidence of atrial fibrillation or ventricular tachycardia. He is pacemaker dependent. Lead parameters are good.  He has coronary disease and underwent bypass surgery 95. In October 2013 he underwent TAVR at [facility] for severe aortic stenosis (26 mm S[place] via direct aortic approach). He underwent bypass surgery 1995 (LIMA to LAD, SVG to PDA, sequential SVG to OM1, OM 2 and OM 3 ). His cardiac catheterization in 2013 showed high-grade stenosis in the distal SVG to PDA and showed that 2 of the 3 oblique marginal arteries were occluded although the bypass itself is widely patent. He had a high-grade stenosis in the left subclavian artery proximal to the vertebral ostium and underwent stenting of this vessel prior to his TAVR.  He also has a history of previous left carotid endarterectomy. He recently underwent carotid ultrasonography, which shows bilateral 50-69%  internal carotid artery disease, but showed that the left subclavian stent was still widely patent. 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  His pacemaker is a dual-chamber Medtronic device implanted in October of 2013 following his aortic valve replacement procedure. He has complete heart block and is pacemaker dependent.  Past Medical History  Diagnosis Date  . Myocardial infarction ([place]) 03/22/1994    acute LAD occlusion  . Hypertension   . Hyperlipidemia   . S/P CABG x 5 03/28/1994    LIMA to LAD, sequential SVG to OM1-OM2-OM4, SVG to RCA, open vein harvest right leg - Dr Redmond Pulling  . Cerebrovascular disease 06/30/2011  . S/P carotid endarterectomy 10/28/1996    Dr Donnetta Hutching  . Subclavian artery stenosis, left 06/30/2011  . Aortoiliac occlusive disease ([place]) 06/30/2011  . Aortic stenosis 06/30/2011    severe  . Amaurosis fugax of left eye 10/11/1996  . Tobacco abuse   . COPD (chronic obstructive pulmonary disease), moderat to severe 07/29/2011  . Coronary artery disease   . PAD (peripheral artery disease) ([place])   . RBBB (right bundle branch block)   . CHB (complete heart block) ([place]) 02/03/2013    Past Surgical History  Procedure Laterality Date  . Carotid endarterectomy  10/28/96  DR. TODD EARLY    LEFT AND DACRON PATCH ANGIOPLASTY  . Coronary artery bypass graft  03/28/94 DR. CHARLES WILSON    x5(LIMA to LAD,SVG to OM1-OM2-OM4, SVG to RCA  . Pacemaker insertion  11/12/2011    Medtronic Adapta; model# ADDR01, serial# [id number]  . Carotid doppler  10/18/2012    Rt bulb/prox  ICA demonstrated 50-69% diameter reduction, Lft mid ICA demonstrated 50-69% diameter reduction, Lft subclavian- = to or <50% sig diameter reduction, Lft vertebral-appeared occluded  . Peripheral vascular angioplasty  07/29/2011    Lft Subclavian 90% stenosis, stented w/ a 7x47m long Cordis Genesis resulting in reduction of 90% stenosos to 0% resdiual.  . Cardiac catheterization  06/24/2011     No intervention - Recommendation-angioplasty/stenting of Lft subclavian artery, high risk replacement of aortic valve w/ biological prosthesis and redo CABG w/ preservation of LIMA bypass and new SVG to PDA and OM, reevaluate carotid arteries and pulmonary function prior to surgery.  . Cardiovascular stress test  07/10/2009    Evidence of mild ischemia in Basal Anteriolateral and Mid Anterolateral regions-additional diaphragmatic attenuation. No ECG changes. Low risk.\  . Transthoracic echocardiogram  08/05/2012    EF 50-55%, septial motion showed abnormal function and dyssynergy, LA mild-moderately dilated  . Left heart catheterization with coronary/graft angiogram N/A 06/24/2011    Procedure: LEFT HEART CATHETERIZATION WITH CBeatrix Fetters  Surgeon: MSanda Klein MD;  [facility];  Service: Cardiovascular;  Laterality: N/A;  . Bilateral upper extremity angiogram N/A 07/29/2011    Procedure: BILATERAL UPPER EXTREMITY ANGIOGRAM;  Surgeon: JLorretta Harp MD;  [facility];  Service: Cardiovascular;  Laterality: N/A;  . Cardiac valve replacement    . Eye surgery      Current Medications: Outpatient Prescriptions Prior to Visit  Medication Sig Dispense Refill  . albuterol (PROVENTIL HFA;VENTOLIN HFA) 108 (90 BASE) MCG/ACT inhaler Inhale 2 puffs into the lungs every 6 (six) hours as needed for wheezing or shortness of breath. 1 Inhaler 0  . aspirin EC 81 MG tablet Take 81 mg by mouth daily.    . carvedilol (COREG) 6.25 MG tablet Take 1 tablet (6.25 mg total) by mouth 2 (two) times daily with a meal. 60 tablet 3  . ramipril (ALTACE) 10 MG capsule TAKE 1 CAPSULE (10 MG TOTAL) BY MOUTH DAILY. 30 capsule 10  . rosuvastatin (CRESTOR) 40 MG tablet Take 1 tablet (40 mg total) by mouth daily. 30 tablet 6  . SYMBICORT 160-4.5 MCG/ACT inhaler TAKE 2 PUFFS BY MOUTH TWICE A DAY  5  . Glycopyrrolate-Formoterol 9-4.8 MCG/ACT AERO Inhale 2 puffs into the lungs 2 (two) times daily.  (Patient not taking: Reported on 08/28/2015) 1 Inhaler 12   No facility-administered medications prior to visit.     Allergies:   Review of patient's allergies indicates no known allergies.   Social History   Social History  . Marital Status: Single    Spouse Name: N/A  . Number of Children: N/A  . Years of Education: N/A   Social History Main Topics  . Smoking status: Former Smoker    Quit date: 03/02/2011  . Smokeless tobacco: Never Used  . Alcohol Use: No  . Drug Use: No  . Sexual Activity: Not on file   Other Topics Concern  . Not on file   Social History Narrative   Married for 63 years   Three children -    1. Son in [place]  2. Daughter in [place]  3.Son in [place]     Two grandchildren            Family History:  The patient's family history includes Diabetes in his maternal grandfather; Heart attack in his father and paternal grandmother; Heart disease in his father; Pneumonia in his maternal grandmother; Stroke in his mother.   ROS:  Please see the history of present illness.    ROS All other systems reviewed and are negative.   PHYSICAL EXAM:   VS:  BP 128/71 mmHg  Pulse 70  Ht '5\' 5"'$  (1.651 m)  Wt 64.411 kg (142 lb)  BMI 23.63 kg/m2   GEN: Well nourished, well developed, in no acute distress HEENT: normal Neck: no JVD, carotid bruits, or masses Cardiac: Paradoxically split S2 RRR; grade 1-2/6 aortic ejection murmur, no diastolic murmurs, rubs, or gallops,no edema ,  healthy subclavian pacemaker site.qual blood pressures in the upper extremities bilaterally Respiratory:  clear to auscultation bilaterally, normal work of breathing GI: soft, nontender, nondistended, + BS MS: no deformity or atrophy Skin: warm and dry, no rash Neuro:  Alert and Oriented x 3, Strength and sensation are intact Psych: euthymic mood, full affect  Wt Readings from Last 3 Encounters:  08/28/15 64.411 kg (142 lb)  04/25/15 65.227 kg (143 lb 12.8 oz)  10/20/14 62.143 kg (137 lb)       Studies/Labs Reviewed:   EKG:  EKG is ordered today.  The ekg ordered today demonstrates AV sequential pacing, QTC 514 ms  Recent Labs: 10/20/2014: Hemoglobin 13.9* 08/28/2015: ALT 20; BUN 21; Creat 1.10; Potassium 4.5; Sodium 141   Lipid Panel    Component Value Date/Time   CHOL 176 08/28/2015 0836   TRIG 58 08/28/2015 0836   HDL 67 08/28/2015 0836   CHOLHDL 2.6 08/28/2015 0836   VLDL 12 08/28/2015 0836   LDLCALC 97 08/28/2015 0836      ASSESSMENT:    1. CHB (complete heart block) (HCC)   2. SSS (sick sinus syndrome) ([place])   3. Pacemaker - Medtronic dual chamber Oct 2013, for CHB   4. Coronary artery disease involving native coronary artery of native heart without angina pectoris   5. Hyperlipidemia   6. Aortic stenosis, critical AS with TAVR 11/2011   7. Bilateral carotid artery stenosis   8. Subclavian artery stenosis, left, pta and stent 07/29/11   9. Medication management      PLAN:  In order of problems listed above:  1. CHB: Pacemaker dependent 2. SSS: Also has frequent need for atrial pacing despite relatively low dose beta blocker 3. Pacemaker - Medtronic dual chamber Oct 2013 100% ventricular pacing for complete heart block. Also has 82% atrial pacing. No meaningful tachyarrhythmias recorded. No permanent reprogramming changes today. Remote downloads every 3 months and at least yearly office follow-up 4. S/P CABG x 5 1995, stenosis to RCA SVG , asymptomatic 5. Hyperlipidemia: He is on the maximum dose of the most potent statin we have available. He is lean and quite active. His diet is healthy. His LDL cholesterol is acceptable if not perfect and he also has a pretty good HDL cholesterol and has not had any new coronary or peripheral vascular events in quite a while.  6. Aortic stenosis s/p TAVR 11/2011 Excellent function of the stent valve at last echo ( [facility] in October 2014 with peak gradient of 16 and a mean gradient of 8 mm Hg and trivial aortic  insufficiency). He has normal left ventricular systolic function. Not going to [facility] anymore and we will follow up on echo here. 7. S/P carotid endarterectomy: Moderate blockage in right ICA unchanged on [place] last October. No recent neurological events. Occluded left vertebral artery. Continue yearly ultrasound follow-up 8. Subclavian artery stenosis, left, pta-stent 07/29/11 Last evaluated September 2015 with stable, <50% restenosis. Occluded left vertebral artery. The pressure equal in the  upper extremities. Recheck ultrasound when he has a carotid Doppler    Medication Adjustments/Labs and Tests Ordered: Current medicines are reviewed at length with the patient today.  Concerns regarding medicines are outlined above.  Medication changes, Labs and Tests ordered today are listed in the Patient Instructions below. Patient Instructions  Medication Instructions: Dr Sallyanne Kuster recommends that you continue on your current medications as directed. Please refer to the Current Medication list given to you today.  Labwork: Your physician recommends that you return for lab work at your earliest [place].  Testing/Procedures: 1. Carotid Artery Doppler - Your physician has requested that you have a carotid duplex. This test is an ultrasound of the carotid arteries in your neck. It looks at blood flow through these arteries that supply the brain with blood. Allow one hour for this exam. There are no restrictions or special instructions.  2. Echocardiogram - Your physician has requested that you have an echocardiogram. Echocardiography is a painless test that uses sound waves to create images of your heart. It provides your doctor with information about the size and shape of your heart and how well your heart's chambers and valves are working. This procedure takes approximately one hour. There are no restrictions for this procedure. This will be done at our [facility] location - [address].  3. Remote Pacemaker Download - Remote monitoring is used to monitor your Pacemaker of ICD from home. This monitoring reduces the number of office visits required to check your device to one time per year. It allows [place] to keep an eye on the functioning of your device to ensure it is working properly. You are scheduled for a device check from home on Tuesday, October 17th, 2017. You may send your transmission at any time that day. If you have a wireless device, the transmission will be sent automatically. After your physician reviews your transmission, you will receive a postcard with your next transmission date.  Follow-up: Dr Sallyanne Kuster recommends that you schedule a follow-up appointment in 12 months with a pacemaker check. You will receive a reminder letter in the mail two months in advance. If you don't receive a letter, please call our office to schedule the follow-up appointment.  If you need a refill on your cardiac medications before your next appointment, please call your pharmacy.     Signed, Sanda Klein, MD  08/30/2015 6:42 PM    [facility] [place], [place],   [address] Phone: [phone]; Fax: [phone]

## 2015-08-29 DIAGNOSIS — E785 Hyperlipidemia, unspecified: Secondary | ICD-10-CM | POA: Diagnosis not present

## 2015-08-29 DIAGNOSIS — Z79899 Other long term (current) drug therapy: Secondary | ICD-10-CM | POA: Diagnosis not present

## 2015-08-29 LAB — COMPREHENSIVE METABOLIC PANEL
ALK PHOS: 81 U/L (ref 40–115)
ALT: 20 U/L (ref 9–46)
AST: 18 U/L (ref 10–35)
Albumin: 3.9 g/dL (ref 3.6–5.1)
BILIRUBIN TOTAL: 0.6 mg/dL (ref 0.2–1.2)
BUN: 21 mg/dL (ref 7–25)
CO2: 27 mmol/L (ref 20–31)
CREATININE: 1.1 mg/dL (ref 0.70–1.11)
Calcium: 8.9 mg/dL (ref 8.6–10.3)
Chloride: 106 mmol/L (ref 98–110)
Glucose, Bld: 84 mg/dL (ref 65–99)
Potassium: 4.5 mmol/L (ref 3.5–5.3)
SODIUM: 141 mmol/L (ref 135–146)
TOTAL PROTEIN: 6.4 g/dL (ref 6.1–8.1)

## 2015-08-29 LAB — LIPID PANEL
CHOLESTEROL: 176 mg/dL (ref 125–200)
HDL: 67 mg/dL (ref >=40)
LDL Cholesterol: 97 mg/dL (ref <130)
Total CHOL/HDL Ratio: 2.6 Ratio (ref <=5.0)
Triglycerides: 58 mg/dL (ref <150)
VLDL: 12 mg/dL (ref <30)

## 2015-08-30 ENCOUNTER — Encounter: Payer: Self-pay | Admitting: Cardiovascular Disease

## 2015-08-30 DIAGNOSIS — I6529 Occlusion and stenosis of unspecified carotid artery: Secondary | ICD-10-CM | POA: Insufficient documentation

## 2015-08-30 DIAGNOSIS — I495 Sick sinus syndrome: Secondary | ICD-10-CM | POA: Insufficient documentation

## 2015-09-05 ENCOUNTER — Ambulatory Visit (HOSPITAL_COMMUNITY)
Admission: RE | Admit: 2015-09-05 | Discharge: 2015-09-05 | Disposition: A | Discharge status: Home / Self Care | Payer: Medicare Other | Source: Ambulatory Visit | Attending: Cardiovascular Disease | Admitting: Cardiovascular Disease

## 2015-09-05 ENCOUNTER — Encounter (HOSPITAL_COMMUNITY): Payer: Medicare Other

## 2015-09-05 DIAGNOSIS — I10 Essential (primary) hypertension: Secondary | ICD-10-CM | POA: Insufficient documentation

## 2015-09-05 DIAGNOSIS — I2510 Atherosclerotic heart disease of native coronary artery without angina pectoris: Secondary | ICD-10-CM | POA: Insufficient documentation

## 2015-09-05 DIAGNOSIS — I6523 Occlusion and stenosis of bilateral carotid arteries: Secondary | ICD-10-CM | POA: Insufficient documentation

## 2015-09-05 DIAGNOSIS — J449 Chronic obstructive pulmonary disease, unspecified: Secondary | ICD-10-CM | POA: Diagnosis not present

## 2015-09-05 DIAGNOSIS — E785 Hyperlipidemia, unspecified: Secondary | ICD-10-CM | POA: Insufficient documentation

## 2015-09-05 DIAGNOSIS — Z720 Tobacco use: Secondary | ICD-10-CM | POA: Diagnosis not present

## 2015-09-11 ENCOUNTER — Telehealth: Payer: Self-pay

## 2015-09-11 DIAGNOSIS — I779 Disorder of arteries and arterioles, unspecified: Secondary | ICD-10-CM

## 2015-09-11 DIAGNOSIS — I739 Peripheral vascular disease, unspecified: Principal | ICD-10-CM

## 2015-09-11 NOTE — Telephone Encounter (Signed)
-----   Message from Sanda Klein, MD sent at 09/07/2015  3:05 PM EDT ----- Essentially no change in carotid blockages (moderate on R, mild on L). Repeat in one year.

## 2015-09-11 NOTE — Telephone Encounter (Signed)
Called patient with results. Patient verbalized understanding. Order placed for repeat carotid duplex in 1 year.

## 2015-09-13 ENCOUNTER — Other Ambulatory Visit: Payer: Self-pay

## 2015-09-13 ENCOUNTER — Ambulatory Visit (HOSPITAL_COMMUNITY): Payer: Medicare Other | Attending: Cardiology

## 2015-09-13 DIAGNOSIS — I351 Nonrheumatic aortic (valve) insufficiency: Secondary | ICD-10-CM | POA: Insufficient documentation

## 2015-09-13 DIAGNOSIS — Z953 Presence of xenogenic heart valve: Secondary | ICD-10-CM | POA: Insufficient documentation

## 2015-09-13 DIAGNOSIS — I517 Cardiomegaly: Secondary | ICD-10-CM | POA: Insufficient documentation

## 2015-09-13 DIAGNOSIS — I340 Nonrheumatic mitral (valve) insufficiency: Secondary | ICD-10-CM | POA: Diagnosis not present

## 2015-09-13 DIAGNOSIS — I350 Nonrheumatic aortic (valve) stenosis: Secondary | ICD-10-CM | POA: Diagnosis not present

## 2015-09-19 ENCOUNTER — Other Ambulatory Visit: Payer: Self-pay | Admitting: Cardiovascular Disease

## 2015-09-19 DIAGNOSIS — H353211 Exudative age-related macular degeneration, right eye, with active choroidal neovascularization: Secondary | ICD-10-CM | POA: Diagnosis not present

## 2015-09-21 ENCOUNTER — Other Ambulatory Visit: Payer: Self-pay

## 2015-09-21 ENCOUNTER — Telehealth: Payer: Self-pay | Admitting: Adult Health

## 2015-09-21 MED ORDER — SYMBICORT 160-4.5 MCG/ACT IN AERO: INHALATION_SPRAY | RESPIRATORY_TRACT | 5 refills | Status: DC

## 2015-09-21 NOTE — Telephone Encounter (Signed)
Pt need new Rx for SYMBICORT   Pharm:  [place]

## 2015-09-21 NOTE — Telephone Encounter (Signed)
Rx refilled.

## 2015-10-11 ENCOUNTER — Ambulatory Visit (INDEPENDENT_AMBULATORY_CARE_PROVIDER_SITE_OTHER): Payer: Medicare Other | Admitting: Cardiovascular Disease

## 2015-10-11 ENCOUNTER — Encounter: Payer: Self-pay | Admitting: Cardiovascular Disease

## 2015-10-11 VITALS — BP 107/65 | HR 70 | Ht 65.0 in | Wt 143.8 lb

## 2015-10-11 DIAGNOSIS — Z950 Presence of cardiac pacemaker: Secondary | ICD-10-CM

## 2015-10-11 DIAGNOSIS — I2510 Atherosclerotic heart disease of native coronary artery without angina pectoris: Secondary | ICD-10-CM | POA: Diagnosis not present

## 2015-10-11 DIAGNOSIS — I10 Essential (primary) hypertension: Secondary | ICD-10-CM

## 2015-10-11 DIAGNOSIS — I771 Stricture of artery: Secondary | ICD-10-CM

## 2015-10-11 DIAGNOSIS — I495 Sick sinus syndrome: Secondary | ICD-10-CM

## 2015-10-11 DIAGNOSIS — I350 Nonrheumatic aortic (valve) stenosis: Secondary | ICD-10-CM

## 2015-10-11 DIAGNOSIS — E785 Hyperlipidemia, unspecified: Secondary | ICD-10-CM

## 2015-10-11 DIAGNOSIS — I442 Atrioventricular block, complete: Secondary | ICD-10-CM

## 2015-10-11 DIAGNOSIS — I6523 Occlusion and stenosis of bilateral carotid arteries: Secondary | ICD-10-CM

## 2015-10-11 DIAGNOSIS — I5022 Chronic systolic (congestive) heart failure: Secondary | ICD-10-CM | POA: Insufficient documentation

## 2015-10-11 DIAGNOSIS — Z01818 Encounter for other preprocedural examination: Secondary | ICD-10-CM

## 2015-10-11 DIAGNOSIS — I708 Atherosclerosis of other arteries: Secondary | ICD-10-CM

## 2015-10-11 DIAGNOSIS — D689 Coagulation defect, unspecified: Secondary | ICD-10-CM

## 2015-10-11 NOTE — Patient Instructions (Addendum)
SCHEDULE FOR LEFT HEART CATH WITH GRAFT WITH DR BERRY IN SEPT BEFORE SEPT 30 2017. Your physician has requested that you have a cardiac catheterization. Cardiac catheterization is used to diagnose and/or treat various heart conditions. Doctors may recommend this procedure for a number of different reasons. The most common reason is to evaluate chest pain. Chest pain can be a symptom of coronary artery disease (CAD), and cardiac catheterization can show whether plaque is narrowing or blocking your heart's arteries. This procedure is also used to evaluate the valves, as well as measure the blood flow and oxygen levels in different parts of your heart. For further information please visit [email or web address]. Please follow instruction sheet, as given.   PLEASE DO LABS FOR 7 DAYS PRIOR TO CARDIAC CATH.- BMP,PT, CBC MAY GO TO LAB ANYTIME OF THE DAY.  Your physician recommends that you schedule a follow-up appointment in:OCT 2017 AFTER THE 18TH WITH DR Sallyanne Kuster. ( FOR POST CATH, PACER CHECK)  If you need a refill on your cardiac medications before your next appointment, please call your pharmacy.

## 2015-10-11 NOTE — Progress Notes (Signed)
Cardiology Office Note    Date:  10/11/2015   ID:  CREWE HEATHMAN, DOB 12-12-1928, MRN [id number]  PCP:  Dorothyann Peng, NP  Cardiologist:   Sanda Klein, MD   Chief Complaint  Patient presents with  . Follow-up    sob; occasionally. cramping in legs occasionally when getting up in the mornings.     History of Present Illness:  Luis Townsend is a 82 y.o. male sinus bradycardia and complete heart block (pacemaker dependent), CAD S/P CABG, AS S/P TAVR, PAD with multiple vessel involvement (carotid stenosis S/P left carotid endarterectomy, S/P left subclavian stent), hyperlipidemia, hypertension and COPD here for follow-up.  He has done quite well and is reasonably active without complaints of exertional angina or dyspnea. He has noticed some dyspnea but this occurs during emotional stress rather than physical activity. He denies palpitations, wheezing, leg edema, bleeding problems, claudication or focal neurological events. He is no longer receiving follow-up at [facility] for his TAVR.  Pacemaker interrogation July 28, shows 82.5% atrial pacing and 100% ventricular pacing without evidence of atrial fibrillation or ventricular tachycardia. He is pacemaker dependent. Lead parameters are good.  Follow-up echocardiogram performed on August 3 shows a drastic and unexpected reduction left ventricular systolic function, with EF down to 35-40%and diffuse hypokinesis. Mention is made of pronounced dyssynchrony secondary to right ventricular pacing. TAVR function is normal. Mean gradient across the prosthesis is 12 mmHg and there is only trivial perivalvular regurgitation. On duplex ultrasonography both subclavian arteries are described as "normal". Carotid duplex ultrasounds shows no change in his moderate bilateral carotid artery stenosis (60-69% on R, 40-59% on L). Left vertebral artery flow is described as "antegrade" although on previous studies this vessel was felt to be occluded.  He has coronary  disease and underwent bypass surgery 95. In October 2013 he underwent TAVR at [facility] for severe aortic stenosis (26 mm Sapien via direct aortic approach). He underwent bypass surgery 1995 (LIMA to LAD, SVG to PDA, sequential SVG to OM1, OM 2 and OM 3 ). His cardiac catheterization in 2013 showed high-grade stenosis in the distal SVG to PDA and showed that 2 of the 3 oblique marginal arteries were occluded although the bypass itself is widely patent. He had a high-grade stenosis in the left subclavian artery proximal to the vertebral ostium and underwent stenting of this vessel prior to his TAVR.  He also has a history of previous left carotid endarterectomy. He recently underwent carotid ultrasonography, which shows bilateral 50-69% internal carotid artery disease, but showed that the left subclavian stent was still widely patent. 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  His pacemaker is a dual-chamber Medtronic device implanted in October of 2013 following his aortic valve replacement procedure. He has complete heart block and is pacemaker dependent.  Past Medical History:  Diagnosis Date  . Amaurosis fugax of left eye 10/11/1996  . Aortic stenosis 06/30/2011   severe  . Aortoiliac occlusive disease ([place]) 06/30/2011  . Cerebrovascular disease 06/30/2011  . CHB (complete heart block) ([place]) 02/03/2013  . COPD (chronic obstructive pulmonary disease), moderat to severe 07/29/2011  . Coronary artery disease   . Hyperlipidemia   . Hypertension   . Myocardial infarction ([place]) 03/22/1994   acute LAD occlusion  . PAD (peripheral artery disease) ([place])   . RBBB (right bundle branch block)   . S/P CABG x 5 03/28/1994   LIMA to LAD, sequential SVG to OM1-OM2-OM4, SVG to RCA, open vein harvest  right leg - Dr Redmond Pulling  . S/P carotid endarterectomy 10/28/1996   Dr Donnetta Hutching  . Subclavian artery stenosis, left 06/30/2011  . Tobacco abuse     Past Surgical History:  Procedure Laterality  Date  . BILATERAL UPPER EXTREMITY ANGIOGRAM N/A 07/29/2011   Procedure: BILATERAL UPPER EXTREMITY ANGIOGRAM;  Surgeon: Lorretta Harp, MD;  Location: [facility] CATH LAB;  Service: Cardiovascular;  Laterality: N/A;  . CARDIAC CATHETERIZATION  06/24/2011   No intervention - Recommendation-angioplasty/stenting of Lft subclavian artery, high risk replacement of aortic valve w/ biological prosthesis and redo CABG w/ preservation of LIMA bypass and new SVG to PDA and OM, reevaluate carotid arteries and pulmonary function prior to surgery.  Marland Kitchen CARDIAC VALVE REPLACEMENT    . CARDIOVASCULAR STRESS TEST  07/10/2009   Evidence of mild ischemia in Basal Anteriolateral and Mid Anterolateral regions-additional diaphragmatic attenuation. No ECG changes. Low risk.\  . CAROTID DOPPLER  10/18/2012   Rt bulb/prox ICA demonstrated 50-69% diameter reduction, Lft mid ICA demonstrated 50-69% diameter reduction, Lft subclavian- = to or <50% sig diameter reduction, Lft vertebral-appeared occluded  . CAROTID ENDARTERECTOMY  10/28/96  DR. TODD EARLY   LEFT AND DACRON PATCH ANGIOPLASTY  . CORONARY ARTERY BYPASS GRAFT  03/28/94 DR. CHARLES WILSON   x5(LIMA to LAD,SVG to OM1-OM2-OM4, SVG to RCA  . EYE SURGERY    . LEFT HEART CATHETERIZATION WITH CORONARY/GRAFT ANGIOGRAM N/A 06/24/2011   Procedure: LEFT HEART CATHETERIZATION WITH Beatrix Fetters;  Surgeon: Sanda Klein, MD;  Location: [facility] CATH LAB;  Service: Cardiovascular;  Laterality: N/A;  . PACEMAKER INSERTION  11/12/2011   Medtronic Adapta; model# ADDR01, serial# [id number]  . PERIPHERAL VASCULAR ANGIOPLASTY  07/29/2011   Lft Subclavian 90% stenosis, stented w/ a 7x26m long Cordis Genesis resulting in reduction of 90% stenosos to 0% resdiual.  . TRANSTHORACIC ECHOCARDIOGRAM  08/05/2012   EF 50-55%, septial motion showed abnormal function and dyssynergy, LA mild-moderately dilated    Current Medications: Outpatient Medications Prior to Visit  Medication Sig Dispense  Refill  . albuterol (PROVENTIL HFA;VENTOLIN HFA) 108 (90 BASE) MCG/ACT inhaler Inhale 2 puffs into the lungs every 6 (six) hours as needed for wheezing or shortness of breath. 1 Inhaler 0  . aspirin EC 81 MG tablet Take 81 mg by mouth daily.    . carvedilol (COREG) 6.25 MG tablet Take 1 tablet (6.25 mg total) by mouth 2 (two) times daily with a meal. 60 tablet 3  . ramipril (ALTACE) 10 MG capsule TAKE 1 CAPSULE (10 MG TOTAL) BY MOUTH DAILY. 30 capsule 10  . rosuvastatin (CRESTOR) 40 MG tablet Take 1 tablet (40 mg total) by mouth daily. 30 tablet 6  . SYMBICORT 160-4.5 MCG/ACT inhaler TAKE 2 PUFFS BY MOUTH TWICE A DAY 1 Inhaler 5   No facility-administered medications prior to visit.      Allergies:   Review of patient's allergies indicates no known allergies.   Social History   Social History  . Marital status: Single    Spouse name: N/A  . Number of children: N/A  . Years of education: N/A   Social History Main Topics  . Smoking status: Former Smoker    Quit date: 03/02/2011  . Smokeless tobacco: Never Used  . Alcohol use No  . Drug use: No  . Sexual activity: Not Asked   Other Topics Concern  . None   Social History Narrative   Married for 63 years   Three children -    1. Son in [place]  2. Daughter in [place]   3.Son in [place]      Two grandchildren            Family History:  The patient's family history includes Diabetes in his maternal grandfather; Heart attack in his father and paternal grandmother; Heart disease in his father; Pneumonia in his maternal grandmother; Stroke in his mother.   ROS:   Please see the history of present illness.    ROS All other systems reviewed and are negative.   PHYSICAL EXAM:   VS:  BP 107/65   Pulse 70   Ht '5\' 5"'$  (1.651 m)   Wt 143 lb 12.8 oz (65.2 kg)   BMI 23.93 kg/m     Rechecked by me: Right arm blood pressure 102/61 mmHg, left arm blood pressure 94/57 mmHg. Bilateral subclavian bruits are present. Grossly equal. GEN: Well  nourished, well developed, in no acute distress  HEENT: normal  Neck: no JVD, carotid bruits, or masses Cardiac: Paradoxically split S2 RRR; grade 1-2/6 aortic ejection murmur, no diastolic murmurs, rubs, or gallops,no edema ,  healthy subclavian pacemaker site. Minimal blood pressures in the upper extremities bilaterally Respiratory:  clear to auscultation bilaterally, normal work of breathing GI: soft, nontender, nondistended, + BS MS: no deformity or atrophy  Skin: warm and dry, no rash Neuro:  Alert and Oriented x 3, Strength and sensation are intact Psych: euthymic mood, full affect  Wt Readings from Last 3 Encounters:  10/11/15 143 lb 12.8 oz (65.2 kg)  08/28/15 142 lb (64.4 kg)  04/25/15 143 lb 12.8 oz (65.2 kg)      Studies/Labs Reviewed:   EKG:  EKG is not ordered today.   Recent Labs: 10/20/2014: Hemoglobin 13.9 08/28/2015: ALT 20; BUN 21; Creat 1.10; Potassium 4.5; Sodium 141   Lipid Panel    Component Value Date/Time   CHOL 176 08/28/2015 0836   TRIG 58 08/28/2015 0836   HDL 67 08/28/2015 0836   CHOLHDL 2.6 08/28/2015 0836   VLDL 12 08/28/2015 0836   LDLCALC 97 08/28/2015 0836      ASSESSMENT:    1. Essential hypertension   2. Systolic CHF, chronic ([place])   3. Coronary artery disease involving native coronary artery of native heart without angina pectoris   4. Pre-op testing   5. Clotting disorder ([place])      PLAN:  In order of problems listed above:  1. Left ventricular systolic dysfunction: This new finding raises the concern for new left ventricular ischemia, although he is not symptomatic. Particular concern would be related to possibility of restenosis or occlusion of the left subclavian artery, although the recent duplex ultrasound did not describe abnormalities. (Previous ultrasound showed 50% restenosis and the blood pressure is a little lower in the left upper extremity). Discussed options for further noninvasive evaluation with a nuclear stress  test, but I think coronary angiography is a better solution. Set him up for angiography with Dr. Gwenlyn Found at his convenience. If there is no evidence for new coronary obstructive disease, it may be that LV dysfunction is related to dyssynchronous LV activation by RV pacing (although he has had complete heart block and pacing for many years, without drop in EF). Since he does not have clinical heart failure, it would be premature to discuss upgrading to a CRT device. He is already on maximum tolerated dose of ACE inhibitor and beta blocker. He is not hypervolemic and does not require diuretic therapy at this time. The angiography procedure has been fully  reviewed with the patient and written informed consent has been obtained. 2. CHB: Pacemaker dependent 3. SSS: Also has frequent need for atrial pacing despite relatively low dose beta blocker 4. Pacemaker - Medtronic dual chamber Oct 2013 100% ventricular pacing for complete heart block. Also has 82% atrial pacing. No meaningful tachyarrhythmias recorded. No permanent reprogramming changes today. Remote downloads every 3 months and at least yearly office follow-up 5. CAD S/P CABG x 5 1995, stenosis to RCA SVG: no angina. 6. Hyperlipidemia: He is on the maximum dose of the most potent statin we have available. He is lean and quite active. His diet is healthy. His LDL cholesterol is acceptable if not perfect and he also has a pretty good HDL cholesterol and has not had any new coronary or peripheral vascular events in quite a while.  7. Aortic stenosis s/p TAVR 11/2011 Excellent function of the stent valve ([facility], October 2014 with a mean gradient of 8 mm Hg and trivial aortic insufficiency, [facility] July 2017 with mean gradient 12 mm Hg and minimal aortic insufficiency).   8. S/P carotid endarterectomy: Moderate blockage in right ICA unchanged on [place] in July. No recent neurological events. Occluded left vertebral artery previous ultrasound, but described as normal  antegrade flow this July. We'll be able to directly evaluate the left vertebral artery at the time of subclavian angiography.  9. Subclavian artery stenosis, left, pta-stent 07/29/11 Last evaluated September 2015 with stable, <50% restenosis, described as normal on study from July 2017. Is a less than 10 mmHg difference in blood pressure between the upper extremities.  10. HTN: normal BP    Medication Adjustments/Labs and Tests Ordered: Current medicines are reviewed at length with the patient today.  Concerns regarding medicines are outlined above.  Medication changes, Labs and Tests ordered today are listed in the Patient Instructions below. Patient Instructions  SCHEDULE FOR LEFT HEART CATH WITH GRAFT WITH DR BERRY IN SEPT BEFORE SEPT 30 2017. Your physician has requested that you have a cardiac catheterization. Cardiac catheterization is used to diagnose and/or treat various heart conditions. Doctors may recommend this procedure for a number of different reasons. The most common reason is to evaluate chest pain. Chest pain can be a symptom of coronary artery disease (CAD), and cardiac catheterization can show whether plaque is narrowing or blocking your heart's arteries. This procedure is also used to evaluate the valves, as well as measure the blood flow and oxygen levels in different parts of your heart. For further information please visit [email or web address]. Please follow instruction sheet, as given.   PLEASE DO LABS FOR 7 DAYS PRIOR TO CARDIAC CATH.- BMP,PT, CBC MAY GO TO LAB ANYTIME OF THE DAY.  Your physician recommends that you schedule a follow-up appointment in:OCT 2017 AFTER THE 18TH WITH DR Sallyanne Kuster. ( FOR POST CATH, PACER CHECK)  If you need a refill on your cardiac medications before your next appointment, please call your pharmacy.         Signed, Sanda Klein, MD  10/11/2015 2:57 PM    [facility] [address], [place], [place]   [address] Phone: [phone]; Fax: [phone]

## 2015-10-23 ENCOUNTER — Telehealth: Payer: Self-pay | Admitting: Cardiovascular Disease

## 2015-10-23 ENCOUNTER — Ambulatory Visit
Admission: RE | Admit: 2015-10-23 | Discharge: 2015-10-23 | Disposition: A | Discharge status: Home / Self Care | Payer: Medicare Other | Source: Ambulatory Visit | Attending: Cardiovascular Disease | Admitting: Cardiovascular Disease

## 2015-10-23 DIAGNOSIS — I2510 Atherosclerotic heart disease of native coronary artery without angina pectoris: Secondary | ICD-10-CM | POA: Diagnosis not present

## 2015-10-23 DIAGNOSIS — Z01818 Encounter for other preprocedural examination: Secondary | ICD-10-CM | POA: Diagnosis not present

## 2015-10-23 DIAGNOSIS — I5022 Chronic systolic (congestive) heart failure: Secondary | ICD-10-CM | POA: Diagnosis not present

## 2015-10-23 DIAGNOSIS — R0602 Shortness of breath: Secondary | ICD-10-CM | POA: Diagnosis not present

## 2015-10-23 DIAGNOSIS — D689 Coagulation defect, unspecified: Secondary | ICD-10-CM | POA: Diagnosis not present

## 2015-10-23 DIAGNOSIS — I10 Essential (primary) hypertension: Secondary | ICD-10-CM | POA: Diagnosis not present

## 2015-10-23 LAB — CBC
HEMATOCRIT: 43.4 % (ref 38.5–50.0)
Hemoglobin: 15.1 g/dL (ref 13.2–17.1)
MCH: 33.2 pg — ABNORMAL HIGH (ref 27.0–33.0)
MCHC: 34.8 g/dL (ref 32.0–36.0)
MCV: 95.4 fL (ref 80.0–100.0)
MPV: 11.3 fL (ref 7.5–12.5)
Platelets: 163 10*3/uL (ref 140–400)
RBC: 4.55 MIL/uL (ref 4.20–5.80)
RDW: 13.3 % (ref 11.0–15.0)
WBC: 6.8 10*3/uL (ref 3.8–10.8)

## 2015-10-23 LAB — PROTIME-INR
INR: 1
Prothrombin Time: 10.8 s (ref 9.0–11.5)

## 2015-10-23 NOTE — Telephone Encounter (Signed)
Returned call to [place] at GI-no order in system for chest xray.  Order placed for chest xray-pre procedure as pt is having heart cath on 9/18.

## 2015-10-24 ENCOUNTER — Other Ambulatory Visit: Payer: Self-pay | Admitting: *Deleted

## 2015-10-24 DIAGNOSIS — I519 Heart disease, unspecified: Secondary | ICD-10-CM

## 2015-10-24 DIAGNOSIS — I5189 Other ill-defined heart diseases: Secondary | ICD-10-CM

## 2015-10-24 DIAGNOSIS — Z01818 Encounter for other preprocedural examination: Secondary | ICD-10-CM

## 2015-10-24 LAB — BASIC METABOLIC PANEL
BUN: 25 mg/dL (ref 7–25)
CHLORIDE: 104 mmol/L (ref 98–110)
CO2: 28 mmol/L (ref 20–31)
CREATININE: 1.13 mg/dL — AB (ref 0.70–1.11)
Calcium: 8.8 mg/dL (ref 8.6–10.3)
Glucose, Bld: 124 mg/dL — ABNORMAL HIGH (ref 65–99)
POTASSIUM: 4.7 mmol/L (ref 3.5–5.3)
Sodium: 141 mmol/L (ref 135–146)

## 2015-10-29 ENCOUNTER — Observation Stay (HOSPITAL_COMMUNITY)
Admission: RE | Admit: 2015-10-29 | Discharge: 2015-10-30 | Disposition: A | Discharge status: Home / Self Care | Payer: Medicare Other | Source: Ambulatory Visit | Attending: Cardiovascular Disease | Admitting: Cardiovascular Disease

## 2015-10-29 ENCOUNTER — Encounter (HOSPITAL_COMMUNITY): Payer: Self-pay | Admitting: *Deleted

## 2015-10-29 ENCOUNTER — Encounter (HOSPITAL_COMMUNITY)
Admission: RE | Disposition: A | Discharge status: Home / Self Care | Payer: Self-pay | Source: Ambulatory Visit | Attending: Cardiovascular Disease

## 2015-10-29 ENCOUNTER — Other Ambulatory Visit: Payer: Self-pay | Admitting: Cardiovascular Disease

## 2015-10-29 DIAGNOSIS — I2582 Chronic total occlusion of coronary artery: Secondary | ICD-10-CM | POA: Diagnosis not present

## 2015-10-29 DIAGNOSIS — I442 Atrioventricular block, complete: Secondary | ICD-10-CM | POA: Insufficient documentation

## 2015-10-29 DIAGNOSIS — Z79899 Other long term (current) drug therapy: Secondary | ICD-10-CM | POA: Diagnosis not present

## 2015-10-29 DIAGNOSIS — I2583 Coronary atherosclerosis due to lipid rich plaque: Secondary | ICD-10-CM | POA: Diagnosis not present

## 2015-10-29 DIAGNOSIS — Z87891 Personal history of nicotine dependence: Secondary | ICD-10-CM | POA: Diagnosis not present

## 2015-10-29 DIAGNOSIS — D689 Coagulation defect, unspecified: Secondary | ICD-10-CM | POA: Diagnosis not present

## 2015-10-29 DIAGNOSIS — I771 Stricture of artery: Secondary | ICD-10-CM | POA: Diagnosis present

## 2015-10-29 DIAGNOSIS — E785 Hyperlipidemia, unspecified: Secondary | ICD-10-CM | POA: Insufficient documentation

## 2015-10-29 DIAGNOSIS — I110 Hypertensive heart disease with heart failure: Secondary | ICD-10-CM | POA: Insufficient documentation

## 2015-10-29 DIAGNOSIS — Z7982 Long term (current) use of aspirin: Secondary | ICD-10-CM | POA: Diagnosis not present

## 2015-10-29 DIAGNOSIS — L7682 Other postprocedural complications of skin and subcutaneous tissue: Secondary | ICD-10-CM | POA: Diagnosis present

## 2015-10-29 DIAGNOSIS — I5189 Other ill-defined heart diseases: Secondary | ICD-10-CM

## 2015-10-29 DIAGNOSIS — I739 Peripheral vascular disease, unspecified: Secondary | ICD-10-CM

## 2015-10-29 DIAGNOSIS — I350 Nonrheumatic aortic (valve) stenosis: Secondary | ICD-10-CM | POA: Diagnosis not present

## 2015-10-29 DIAGNOSIS — I2510 Atherosclerotic heart disease of native coronary artery without angina pectoris: Secondary | ICD-10-CM | POA: Diagnosis not present

## 2015-10-29 DIAGNOSIS — Z7951 Long term (current) use of inhaled steroids: Secondary | ICD-10-CM | POA: Diagnosis not present

## 2015-10-29 DIAGNOSIS — Z952 Presence of prosthetic heart valve: Secondary | ICD-10-CM | POA: Diagnosis not present

## 2015-10-29 DIAGNOSIS — I519 Heart disease, unspecified: Secondary | ICD-10-CM

## 2015-10-29 DIAGNOSIS — Z01818 Encounter for other preprocedural examination: Secondary | ICD-10-CM

## 2015-10-29 DIAGNOSIS — Z951 Presence of aortocoronary bypass graft: Secondary | ICD-10-CM | POA: Diagnosis not present

## 2015-10-29 DIAGNOSIS — J449 Chronic obstructive pulmonary disease, unspecified: Secondary | ICD-10-CM | POA: Insufficient documentation

## 2015-10-29 DIAGNOSIS — I252 Old myocardial infarction: Secondary | ICD-10-CM | POA: Insufficient documentation

## 2015-10-29 DIAGNOSIS — Z950 Presence of cardiac pacemaker: Secondary | ICD-10-CM | POA: Diagnosis not present

## 2015-10-29 DIAGNOSIS — I5022 Chronic systolic (congestive) heart failure: Secondary | ICD-10-CM | POA: Diagnosis not present

## 2015-10-29 HISTORY — PX: CARDIAC CATHETERIZATION: SHX172

## 2015-10-29 LAB — CBC
HEMATOCRIT: 37.9 % — AB (ref 39.0–52.0)
HEMOGLOBIN: 12.7 g/dL — AB (ref 13.0–17.0)
MCH: 32.3 pg (ref 26.0–34.0)
MCHC: 33.5 g/dL (ref 30.0–36.0)
MCV: 96.4 fL (ref 78.0–100.0)
Platelets: 146 10*3/uL — ABNORMAL LOW (ref 150–400)
RBC: 3.93 MIL/uL — ABNORMAL LOW (ref 4.22–5.81)
RDW: 12.7 % (ref 11.5–15.5)
WBC: 6.1 10*3/uL (ref 4.0–10.5)

## 2015-10-29 LAB — CREATININE, SERUM: CREATININE: 1.04 mg/dL (ref 0.61–1.24)

## 2015-10-29 SURGERY — ABDOMINAL AORTAGRAM

## 2015-10-29 MED ORDER — SODIUM CHLORIDE 0.9% FLUSH: 3.0000 mL | INTRAVENOUS | Status: DC | PRN

## 2015-10-29 MED ORDER — CARVEDILOL 6.25 MG PO TABS
6.2500 mg | ORAL_TABLET | Freq: Two times a day (BID) | ORAL | Status: DC
Start: 2015-10-30 — End: 2015-10-30
  Administered 2015-10-29 – 2015-10-30 (×2): 6.25 mg via ORAL
  Filled 2015-10-29: qty 1
  Filled 2015-10-29: qty 2

## 2015-10-29 MED ORDER — ROSUVASTATIN CALCIUM 40 MG PO TABS
40.0000 mg | ORAL_TABLET | Freq: Every day | ORAL | Status: DC
  Administered 2015-10-29 – 2015-10-30 (×2): 40 mg via ORAL
  Filled 2015-10-29: qty 2
  Filled 2015-10-29 (×2): qty 1

## 2015-10-29 MED ORDER — HYDRALAZINE HCL 20 MG/ML IJ SOLN: 10.0000 mg | INTRAMUSCULAR | Status: DC | PRN

## 2015-10-29 MED ORDER — LIDOCAINE HCL (PF) 1 % IJ SOLN
INTRAMUSCULAR | Status: DC | PRN
  Administered 2015-10-29: 15 mL

## 2015-10-29 MED ORDER — ASPIRIN 81 MG PO CHEW: 81.0000 mg | CHEWABLE_TABLET | Freq: Every day | ORAL | Status: DC

## 2015-10-29 MED ORDER — ASPIRIN EC 81 MG PO TBEC
81.0000 mg | DELAYED_RELEASE_TABLET | Freq: Every day | ORAL | Status: DC
  Administered 2015-10-30: 81 mg via ORAL
  Filled 2015-10-29: qty 1

## 2015-10-29 MED ORDER — SODIUM CHLORIDE 0.9 % IV SOLN: INTRAVENOUS | Status: DC

## 2015-10-29 MED ORDER — ACETAMINOPHEN 325 MG PO TABS: 650.0000 mg | ORAL_TABLET | ORAL | Status: DC | PRN

## 2015-10-29 MED ORDER — ZOLPIDEM TARTRATE 5 MG PO TABS: 5.0000 mg | ORAL_TABLET | Freq: Every evening | ORAL | Status: DC | PRN

## 2015-10-29 MED ORDER — ALPRAZOLAM 0.25 MG PO TABS: 0.2500 mg | ORAL_TABLET | Freq: Two times a day (BID) | ORAL | Status: DC | PRN

## 2015-10-29 MED ORDER — SODIUM CHLORIDE 0.9% FLUSH: 3.0000 mL | Freq: Two times a day (BID) | INTRAVENOUS | Status: DC

## 2015-10-29 MED ORDER — ONDANSETRON HCL 4 MG/2ML IJ SOLN: 4.0000 mg | Freq: Four times a day (QID) | INTRAMUSCULAR | Status: DC | PRN

## 2015-10-29 MED ORDER — INFLUENZA VAC SPLIT QUAD 0.5 ML IM SUSY: 0.5000 mL | PREFILLED_SYRINGE | INTRAMUSCULAR | Status: DC | PRN

## 2015-10-29 MED ORDER — SODIUM CHLORIDE 0.9 % WEIGHT BASED INFUSION: 3.0000 mL/kg/h | INTRAVENOUS | Status: AC

## 2015-10-29 MED ORDER — ENOXAPARIN SODIUM 40 MG/0.4ML ~~LOC~~ SOLN: 40.0000 mg | SUBCUTANEOUS | Status: DC

## 2015-10-29 MED ORDER — HEPARIN (PORCINE) IN NACL 2-0.9 UNIT/ML-% IJ SOLN
INTRAMUSCULAR | Status: DC | PRN
  Administered 2015-10-29: 16:00:00

## 2015-10-29 MED ORDER — MOMETASONE FURO-FORMOTEROL FUM 200-5 MCG/ACT IN AERO
2.0000 | INHALATION_SPRAY | Freq: Two times a day (BID) | RESPIRATORY_TRACT | Status: DC
  Administered 2015-10-29 – 2015-10-30 (×2): 2 via RESPIRATORY_TRACT
  Filled 2015-10-29: qty 8.8

## 2015-10-29 MED ORDER — ASPIRIN 81 MG PO CHEW
81.0000 mg | CHEWABLE_TABLET | Freq: Once | ORAL | Status: AC
  Administered 2015-10-29: 81 mg via ORAL

## 2015-10-29 MED ORDER — ALBUTEROL SULFATE (2.5 MG/3ML) 0.083% IN NEBU: 2.5000 mg | INHALATION_SOLUTION | Freq: Four times a day (QID) | RESPIRATORY_TRACT | Status: DC | PRN

## 2015-10-29 MED ORDER — IOPAMIDOL (ISOVUE-370) INJECTION 76%
INTRAVENOUS | Status: DC | PRN
  Administered 2015-10-29: 90 mL

## 2015-10-29 MED ORDER — LIDOCAINE HCL (PF) 1 % IJ SOLN
INTRAMUSCULAR | Status: AC
  Filled 2015-10-29: qty 30

## 2015-10-29 MED ORDER — PNEUMOCOCCAL VAC POLYVALENT 25 MCG/0.5ML IJ INJ: 0.5000 mL | INJECTION | INTRAMUSCULAR | Status: DC | PRN

## 2015-10-29 MED ORDER — MORPHINE SULFATE (PF) 2 MG/ML IV SOLN: 2.0000 mg | INTRAVENOUS | Status: DC | PRN

## 2015-10-29 MED ORDER — SODIUM CHLORIDE 0.9 % IV SOLN: 250.0000 mL | INTRAVENOUS | Status: DC | PRN

## 2015-10-29 MED ORDER — RAMIPRIL 10 MG PO CAPS
10.0000 mg | ORAL_CAPSULE | Freq: Every day | ORAL | Status: DC
  Administered 2015-10-30: 10:00:00 10 mg via ORAL
  Filled 2015-10-29: qty 1

## 2015-10-29 MED ORDER — ALBUTEROL SULFATE HFA 108 (90 BASE) MCG/ACT IN AERS: 2.0000 | INHALATION_SPRAY | Freq: Four times a day (QID) | RESPIRATORY_TRACT | Status: DC | PRN

## 2015-10-29 MED ORDER — IOPAMIDOL (ISOVUE-370) INJECTION 76%
INTRAVENOUS | Status: AC
  Filled 2015-10-29: qty 100

## 2015-10-29 MED ORDER — HEPARIN (PORCINE) IN NACL 2-0.9 UNIT/ML-% IJ SOLN
INTRAMUSCULAR | Status: AC
  Filled 2015-10-29: qty 1000

## 2015-10-29 MED ORDER — ASPIRIN 81 MG PO CHEW
CHEWABLE_TABLET | ORAL | Status: AC
  Filled 2015-10-29: qty 1

## 2015-10-29 SURGICAL SUPPLY — 10 items
CATH IMPULSE 5F ANG/FL3.5 (CATHETERS) ×4 IMPLANT
CATH INFINITI 5 FR IM (CATHETERS) ×4 IMPLANT
KIT HEART LEFT (KITS) ×4 IMPLANT
PACK CARDIAC CATHETERIZATION (CUSTOM PROCEDURE TRAY) ×4 IMPLANT
SHEATH PINNACLE 5F 10CM (SHEATH) ×4 IMPLANT
SYR MEDRAD MARK V 150ML (SYRINGE) ×4 IMPLANT
TRANSDUCER W/STOPCOCK (MISCELLANEOUS) ×4 IMPLANT
TUBING CIL FLEX 10 FLL-RA (TUBING) ×4 IMPLANT
WIRE EMERALD 3MM-J .035X150CM (WIRE) ×4 IMPLANT
WIRE SAFE-T 1.5MM-J .035X260CM (WIRE) ×4 IMPLANT

## 2015-10-29 NOTE — Progress Notes (Signed)
Client called and stated felt warmth left groin and bleeding noted and pressure held for 31mn; Dr BGwenlyn Foundnotified and order to admit to telemetry observation and RArmanda Heritagenotified

## 2015-10-29 NOTE — Progress Notes (Signed)
Site area: LFA Site Prior to Removal:  Level 0 Pressure Applied For:63mn Manual:  yes  Patient Status During Pull:  stable Post Pull Site:  Level 0 Post Pull Instructions Given: yes  Post Pull Pulses Present: doppler Dressing Applied:  tegaderm Bedrest begins @ 1700 till 1900 Comments:

## 2015-10-29 NOTE — Progress Notes (Signed)
Called to P13 for rebleed.pressure to site x 30 min. Redressed. Level 0. Complete at 1810.

## 2015-10-29 NOTE — H&P (View-Only) (Signed)
Cardiology Office Note    Date:  10/11/2015   ID:  KASON BENAK, DOB Jan 09, 1929, MRN [id number]  PCP:  Dorothyann Peng, NP  Cardiologist:   Sanda Klein, MD   Chief Complaint  Patient presents with  . Follow-up    sob; occasionally. cramping in legs occasionally when getting up in the mornings.     History of Present Illness:  Luis Townsend is a 82 y.o. male sinus bradycardia and complete heart block (pacemaker dependent), CAD S/P CABG, AS S/P TAVR, PAD with multiple vessel involvement (carotid stenosis S/P left carotid endarterectomy, S/P left subclavian stent), hyperlipidemia, hypertension and COPD here for follow-up.  He has done quite well and is reasonably active without complaints of exertional angina or dyspnea. He has noticed some dyspnea but this occurs during emotional stress rather than physical activity. He denies palpitations, wheezing, leg edema, bleeding problems, claudication or focal neurological events. He is no longer receiving follow-up at [facility] for his TAVR.  Pacemaker interrogation July 28, shows 82.5% atrial pacing and 100% ventricular pacing without evidence of atrial fibrillation or ventricular tachycardia. He is pacemaker dependent. Lead parameters are good.  Follow-up echocardiogram performed on August 3 shows a drastic and unexpected reduction left ventricular systolic function, with EF down to 35-40%and diffuse hypokinesis. Mention is made of pronounced dyssynchrony secondary to right ventricular pacing. TAVR function is normal. Mean gradient across the prosthesis is 12 mmHg and there is only trivial perivalvular regurgitation. On duplex ultrasonography both subclavian arteries are described as "normal". Carotid duplex ultrasounds shows no change in his moderate bilateral carotid artery stenosis (60-69% on R, 40-59% on L). Left vertebral artery flow is described as "antegrade" although on previous studies this vessel was felt to be occluded.  He has coronary  disease and underwent bypass surgery 95. In October 2013 he underwent TAVR at [facility] for severe aortic stenosis (26 mm Sapien via direct aortic approach). He underwent bypass surgery 1995 (LIMA to LAD, SVG to PDA, sequential SVG to OM1, OM 2 and OM 3 ). His cardiac catheterization in 2013 showed high-grade stenosis in the distal SVG to PDA and showed that 2 of the 3 oblique marginal arteries were occluded although the bypass itself is widely patent. He had a high-grade stenosis in the left subclavian artery proximal to the vertebral ostium and underwent stenting of this vessel prior to his TAVR.  He also has a history of previous left carotid endarterectomy. He recently underwent carotid ultrasonography, which shows bilateral 50-69% internal carotid artery disease, but showed that the left subclavian stent was still widely patent. 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  His pacemaker is a dual-chamber Medtronic device implanted in October of 2013 following his aortic valve replacement procedure. He has complete heart block and is pacemaker dependent.  Past Medical History:  Diagnosis Date  . Amaurosis fugax of left eye 10/11/1996  . Aortic stenosis 06/30/2011   severe  . Aortoiliac occlusive disease ([place]) 06/30/2011  . Cerebrovascular disease 06/30/2011  . CHB (complete heart block) ([place]) 02/03/2013  . COPD (chronic obstructive pulmonary disease), moderat to severe 07/29/2011  . Coronary artery disease   . Hyperlipidemia   . Hypertension   . Myocardial infarction ([place]) 03/22/1994   acute LAD occlusion  . PAD (peripheral artery disease) ([place])   . RBBB (right bundle branch block)   . S/P CABG x 5 03/28/1994   LIMA to LAD, sequential SVG to OM1-OM2-OM4, SVG to RCA, open vein harvest  right leg - Dr Redmond Pulling  . S/P carotid endarterectomy 10/28/1996   Dr Donnetta Hutching  . Subclavian artery stenosis, left 06/30/2011  . Tobacco abuse     Past Surgical History:  Procedure Laterality  Date  . BILATERAL UPPER EXTREMITY ANGIOGRAM N/A 07/29/2011   Procedure: BILATERAL UPPER EXTREMITY ANGIOGRAM;  Surgeon: Lorretta Harp, MD;  Location: [facility] CATH LAB;  Service: Cardiovascular;  Laterality: N/A;  . CARDIAC CATHETERIZATION  06/24/2011   No intervention - Recommendation-angioplasty/stenting of Lft subclavian artery, high risk replacement of aortic valve w/ biological prosthesis and redo CABG w/ preservation of LIMA bypass and new SVG to PDA and OM, reevaluate carotid arteries and pulmonary function prior to surgery.  Marland Kitchen CARDIAC VALVE REPLACEMENT    . CARDIOVASCULAR STRESS TEST  07/10/2009   Evidence of mild ischemia in Basal Anteriolateral and Mid Anterolateral regions-additional diaphragmatic attenuation. No ECG changes. Low risk.\  . CAROTID DOPPLER  10/18/2012   Rt bulb/prox ICA demonstrated 50-69% diameter reduction, Lft mid ICA demonstrated 50-69% diameter reduction, Lft subclavian- = to or <50% sig diameter reduction, Lft vertebral-appeared occluded  . CAROTID ENDARTERECTOMY  10/28/96  DR. TODD EARLY   LEFT AND DACRON PATCH ANGIOPLASTY  . CORONARY ARTERY BYPASS GRAFT  03/28/94 DR. CHARLES WILSON   x5(LIMA to LAD,SVG to OM1-OM2-OM4, SVG to RCA  . EYE SURGERY    . LEFT HEART CATHETERIZATION WITH CORONARY/GRAFT ANGIOGRAM N/A 06/24/2011   Procedure: LEFT HEART CATHETERIZATION WITH Beatrix Fetters;  Surgeon: Sanda Klein, MD;  Location: [facility] CATH LAB;  Service: Cardiovascular;  Laterality: N/A;  . PACEMAKER INSERTION  11/12/2011   Medtronic Adapta; model# ADDR01, serial# [id number]  . PERIPHERAL VASCULAR ANGIOPLASTY  07/29/2011   Lft Subclavian 90% stenosis, stented w/ a 7x47m long Cordis Genesis resulting in reduction of 90% stenosos to 0% resdiual.  . TRANSTHORACIC ECHOCARDIOGRAM  08/05/2012   EF 50-55%, septial motion showed abnormal function and dyssynergy, LA mild-moderately dilated    Current Medications: Outpatient Medications Prior to Visit  Medication Sig Dispense  Refill  . albuterol (PROVENTIL HFA;VENTOLIN HFA) 108 (90 BASE) MCG/ACT inhaler Inhale 2 puffs into the lungs every 6 (six) hours as needed for wheezing or shortness of breath. 1 Inhaler 0  . aspirin EC 81 MG tablet Take 81 mg by mouth daily.    . carvedilol (COREG) 6.25 MG tablet Take 1 tablet (6.25 mg total) by mouth 2 (two) times daily with a meal. 60 tablet 3  . ramipril (ALTACE) 10 MG capsule TAKE 1 CAPSULE (10 MG TOTAL) BY MOUTH DAILY. 30 capsule 10  . rosuvastatin (CRESTOR) 40 MG tablet Take 1 tablet (40 mg total) by mouth daily. 30 tablet 6  . SYMBICORT 160-4.5 MCG/ACT inhaler TAKE 2 PUFFS BY MOUTH TWICE A DAY 1 Inhaler 5   No facility-administered medications prior to visit.      Allergies:   Review of patient's allergies indicates no known allergies.   Social History   Social History  . Marital status: Single    Spouse name: N/A  . Number of children: N/A  . Years of education: N/A   Social History Main Topics  . Smoking status: Former Smoker    Quit date: 03/02/2011  . Smokeless tobacco: Never Used  . Alcohol use No  . Drug use: No  . Sexual activity: Not Asked   Other Topics Concern  . None   Social History Narrative   Married for 63 years   Three children -    1. Son in [place]  2. Daughter in [place]   3.Son in [place]      Two grandchildren            Family History:  The patient's family history includes Diabetes in his maternal grandfather; Heart attack in his father and paternal grandmother; Heart disease in his father; Pneumonia in his maternal grandmother; Stroke in his mother.   ROS:   Please see the history of present illness.    ROS All other systems reviewed and are negative.   PHYSICAL EXAM:   VS:  BP 107/65   Pulse 70   Ht '5\' 5"'$  (1.651 m)   Wt 143 lb 12.8 oz (65.2 kg)   BMI 23.93 kg/m     Rechecked by me: Right arm blood pressure 102/61 mmHg, left arm blood pressure 94/57 mmHg. Bilateral subclavian bruits are present. Grossly equal. GEN: Well  nourished, well developed, in no acute distress  HEENT: normal  Neck: no JVD, carotid bruits, or masses Cardiac: Paradoxically split S2 RRR; grade 1-2/6 aortic ejection murmur, no diastolic murmurs, rubs, or gallops,no edema ,  healthy subclavian pacemaker site. Minimal blood pressures in the upper extremities bilaterally Respiratory:  clear to auscultation bilaterally, normal work of breathing GI: soft, nontender, nondistended, + BS MS: no deformity or atrophy  Skin: warm and dry, no rash Neuro:  Alert and Oriented x 3, Strength and sensation are intact Psych: euthymic mood, full affect  Wt Readings from Last 3 Encounters:  10/11/15 143 lb 12.8 oz (65.2 kg)  08/28/15 142 lb (64.4 kg)  04/25/15 143 lb 12.8 oz (65.2 kg)      Studies/Labs Reviewed:   EKG:  EKG is not ordered today.   Recent Labs: 10/20/2014: Hemoglobin 13.9 08/28/2015: ALT 20; BUN 21; Creat 1.10; Potassium 4.5; Sodium 141   Lipid Panel    Component Value Date/Time   CHOL 176 08/28/2015 0836   TRIG 58 08/28/2015 0836   HDL 67 08/28/2015 0836   CHOLHDL 2.6 08/28/2015 0836   VLDL 12 08/28/2015 0836   LDLCALC 97 08/28/2015 0836      ASSESSMENT:    1. Essential hypertension   2. Systolic CHF, chronic ([place])   3. Coronary artery disease involving native coronary artery of native heart without angina pectoris   4. Pre-op testing   5. Clotting disorder ([place])      PLAN:  In order of problems listed above:  1. Left ventricular systolic dysfunction: This new finding raises the concern for new left ventricular ischemia, although he is not symptomatic. Particular concern would be related to possibility of restenosis or occlusion of the left subclavian artery, although the recent duplex ultrasound did not describe abnormalities. (Previous ultrasound showed 50% restenosis and the blood pressure is a little lower in the left upper extremity). Discussed options for further noninvasive evaluation with a nuclear stress  test, but I think coronary angiography is a better solution. Set him up for angiography with Dr. Gwenlyn Found at his convenience. If there is no evidence for new coronary obstructive disease, it may be that LV dysfunction is related to dyssynchronous LV activation by RV pacing (although he has had complete heart block and pacing for many years, without drop in EF). Since he does not have clinical heart failure, it would be premature to discuss upgrading to a CRT device. He is already on maximum tolerated dose of ACE inhibitor and beta blocker. He is not hypervolemic and does not require diuretic therapy at this time. The angiography procedure has been fully  reviewed with the patient and written informed consent has been obtained. 2. CHB: Pacemaker dependent 3. SSS: Also has frequent need for atrial pacing despite relatively low dose beta blocker 4. Pacemaker - Medtronic dual chamber Oct 2013 100% ventricular pacing for complete heart block. Also has 82% atrial pacing. No meaningful tachyarrhythmias recorded. No permanent reprogramming changes today. Remote downloads every 3 months and at least yearly office follow-up 5. CAD S/P CABG x 5 1995, stenosis to RCA SVG: no angina. 6. Hyperlipidemia: He is on the maximum dose of the most potent statin we have available. He is lean and quite active. His diet is healthy. His LDL cholesterol is acceptable if not perfect and he also has a pretty good HDL cholesterol and has not had any new coronary or peripheral vascular events in quite a while.  7. Aortic stenosis s/p TAVR 11/2011 Excellent function of the stent valve ([facility], October 2014 with a mean gradient of 8 mm Hg and trivial aortic insufficiency, [facility] July 2017 with mean gradient 12 mm Hg and minimal aortic insufficiency).   8. S/P carotid endarterectomy: Moderate blockage in right ICA unchanged on [place] in July. No recent neurological events. Occluded left vertebral artery previous ultrasound, but described as normal  antegrade flow this July. We'll be able to directly evaluate the left vertebral artery at the time of subclavian angiography.  9. Subclavian artery stenosis, left, pta-stent 07/29/11 Last evaluated September 2015 with stable, <50% restenosis, described as normal on study from July 2017. Is a less than 10 mmHg difference in blood pressure between the upper extremities.  10. HTN: normal BP    Medication Adjustments/Labs and Tests Ordered: Current medicines are reviewed at length with the patient today.  Concerns regarding medicines are outlined above.  Medication changes, Labs and Tests ordered today are listed in the Patient Instructions below. Patient Instructions  SCHEDULE FOR LEFT HEART CATH WITH GRAFT WITH DR BERRY IN SEPT BEFORE SEPT 30 2017. Your physician has requested that you have a cardiac catheterization. Cardiac catheterization is used to diagnose and/or treat various heart conditions. Doctors may recommend this procedure for a number of different reasons. The most common reason is to evaluate chest pain. Chest pain can be a symptom of coronary artery disease (CAD), and cardiac catheterization can show whether plaque is narrowing or blocking your heart's arteries. This procedure is also used to evaluate the valves, as well as measure the blood flow and oxygen levels in different parts of your heart. For further information please visit [email or web address]. Please follow instruction sheet, as given.   PLEASE DO LABS FOR 7 DAYS PRIOR TO CARDIAC CATH.- BMP,PT, CBC MAY GO TO LAB ANYTIME OF THE DAY.  Your physician recommends that you schedule a follow-up appointment in:OCT 2017 AFTER THE 18TH WITH DR Sallyanne Kuster. ( FOR POST CATH, PACER CHECK)  If you need a refill on your cardiac medications before your next appointment, please call your pharmacy.         Signed, Sanda Klein, MD  10/11/2015 2:57 PM    [facility] [address], [place]   [address] Phone: [phone]; Fax: [phone]

## 2015-10-29 NOTE — Interval H&P Note (Signed)
Cath Lab Visit (complete for each Cath Lab visit)  Clinical Evaluation Leading to the Procedure:   ACS: No.  Non-ACS:    Anginal Classification: CCS I  Anti-ischemic medical therapy: Minimal Therapy (1 class of medications)  Non-Invasive Test Results: No non-invasive testing performed  Prior CABG: Previous CABG      History and Physical Interval Note:  10/29/2015 3:49 PM  Luis Townsend  has presented today for surgery, with the diagnosis of chf, hypertension  The various methods of treatment have been discussed with the patient and family. After consideration of risks, benefits and other options for treatment, the patient has consented to  Procedure(s): Left Heart Cath and Coronary Angiography (N/A) as a surgical intervention .  The patient's history has been reviewed, patient examined, no change in status, stable for surgery.  I have reviewed the patient's chart and labs.  Questions were answered to the patient's satisfaction.     Quay Burow

## 2015-10-29 NOTE — Progress Notes (Signed)
Report called to [facility] for 6-c-02 and transferred via bed

## 2015-10-30 ENCOUNTER — Encounter (HOSPITAL_COMMUNITY): Payer: Self-pay | Admitting: Cardiovascular Disease

## 2015-10-30 DIAGNOSIS — I739 Peripheral vascular disease, unspecified: Secondary | ICD-10-CM | POA: Diagnosis not present

## 2015-10-30 DIAGNOSIS — Z951 Presence of aortocoronary bypass graft: Secondary | ICD-10-CM | POA: Diagnosis not present

## 2015-10-30 DIAGNOSIS — I2583 Coronary atherosclerosis due to lipid rich plaque: Secondary | ICD-10-CM | POA: Diagnosis not present

## 2015-10-30 DIAGNOSIS — I2582 Chronic total occlusion of coronary artery: Secondary | ICD-10-CM | POA: Diagnosis not present

## 2015-10-30 DIAGNOSIS — I350 Nonrheumatic aortic (valve) stenosis: Secondary | ICD-10-CM | POA: Diagnosis not present

## 2015-10-30 DIAGNOSIS — I2510 Atherosclerotic heart disease of native coronary artery without angina pectoris: Secondary | ICD-10-CM | POA: Diagnosis not present

## 2015-10-30 LAB — CBC
HEMATOCRIT: 40.2 % (ref 39.0–52.0)
HEMOGLOBIN: 13.2 g/dL (ref 13.0–17.0)
MCH: 31.8 pg (ref 26.0–34.0)
MCHC: 32.8 g/dL (ref 30.0–36.0)
MCV: 96.9 fL (ref 78.0–100.0)
Platelets: 146 10*3/uL — ABNORMAL LOW (ref 150–400)
RBC: 4.15 MIL/uL — ABNORMAL LOW (ref 4.22–5.81)
RDW: 12.9 % (ref 11.5–15.5)
WBC: 7.8 10*3/uL (ref 4.0–10.5)

## 2015-10-30 LAB — BASIC METABOLIC PANEL
Anion gap: 6 (ref 5–15)
BUN: 14 mg/dL (ref 6–20)
CHLORIDE: 105 mmol/L (ref 101–111)
CO2: 29 mmol/L (ref 22–32)
CREATININE: 1.1 mg/dL (ref 0.61–1.24)
Calcium: 8.8 mg/dL — ABNORMAL LOW (ref 8.9–10.3)
GFR calc Af Amer: >60 mL/min (ref >60)
GFR calc non Af Amer: 59 mL/min — ABNORMAL LOW (ref >60)
Glucose, Bld: 75 mg/dL (ref 65–99)
Potassium: 4.7 mmol/L (ref 3.5–5.1)
Sodium: 140 mmol/L (ref 135–145)

## 2015-10-30 MED ORDER — FUROSEMIDE 20 MG PO TABS
20.0000 mg | ORAL_TABLET | Freq: Every day | ORAL | 3 refills | Status: DC
Start: 2015-10-30 — End: 2015-11-22

## 2015-10-30 NOTE — Progress Notes (Signed)
Patient Name: Luis Townsend Date of Encounter: 10/30/2015  Hospital Problem List     Active Problems:   Aortic stenosis, critical AS with TAVR 11/2011   S/P CABG x 5, stenosis to RCA VG 1995   Subclavian artery stenosis, left, pta and stent 07/29/11   Left ventricular systolic dysfunction   PAD (peripheral artery disease) (H[facility])   Bleeding at insertion site    Subjective   Feeling well this morning.   Inpatient Medications    . aspirin EC  81 mg Oral Daily  . carvedilol  6.25 mg Oral BID WC  . enoxaparin (LOVENOX) injection  40 mg Subcutaneous Q24H  . mometasone-formoterol  2 puff Inhalation BID  . ramipril  10 mg Oral Daily  . rosuvastatin  40 mg Oral Daily  . sodium chloride flush  3 mL Intravenous Q12H    Vital Signs    Vitals:   10/29/15 2025 10/29/15 2056 10/29/15 2100 10/30/15 0345  BP: (!) 127/59 124/64  (!) 150/59  Pulse: 71 78 78 71  Resp:  19 19 (!) 21  Temp:  98 F (36.7 C)  98 F (36.7 C)  TempSrc:  Oral  Oral  SpO2: 97% 97% 97% 97%  Weight:  143 lb 1.3 oz (64.9 kg)  143 lb 1.3 oz (64.9 kg)  Height:  '5\' 5"'$  (1.651 m)      Intake/Output Summary (Last 24 hours) at 10/30/15 0713 Last data filed at 10/30/15 0122  Gross per 24 hour  Intake                0 ml  Output              700 ml  Net             -700 ml   Filed Weights   10/29/15 1320 10/29/15 2056 10/30/15 0345  Weight: 143 lb (64.9 kg) 143 lb 1.3 oz (64.9 kg) 143 lb 1.3 oz (64.9 kg)    Physical Exam    General: Pleasant caucasian male, NAD. Neuro: Alert and oriented X 3. Moves all extremities spontaneously. Psych: Normal affect. HEENT:  Normal  Neck: Supple without bruits or JVD. Lungs:  Resp regular and unlabored, CTA. Heart: RRR no s3, s4, 2/6 systolic murmurs. PPM site stable Abdomen: Soft, non-tender, non-distended, BS + x 4.  Extremities: No clubbing, cyanosis or edema. DP/PT/Radials 2+ and equal bilaterally. Left femoral site without bruising or hematoma.  Labs     CBC  Recent Labs  10/29/15 2209 10/30/15 0343  WBC 6.1 7.8  HGB 12.7* 13.2  HCT 37.9* 40.2  MCV 96.4 96.9  PLT 146* 175*   Basic Metabolic Panel  Recent Labs  10/29/15 2209 10/30/15 0343  NA  --  140  K  --  4.7  CL  --  105  CO2  --  29  GLUCOSE  --  75  BUN  --  14  CREATININE 1.04 1.10  CALCIUM  --  8.8*    Telemetry    AV paced  ECG    SR -AV paced  Radiology     10/29/15  Diagnostic Diagram        Assessment & Plan    82 y.o. male sinus bradycardia and complete heart block (pacemaker dependent), CAD S/P CABG, AS S/P TAVR, PAD with multiple vessel involvement (carotid stenosis S/P left carotid endarterectomy, S/P left subclavian stent), hyperlipidemia, hypertension and COPD who presented for follow up and found to have  reduced left ventricular systolic function and EF down to 35-40%. Given this finding concern raised for possible restenosis/ occlusion of the LAD. Plan was made to proceed forward with LHC at the patient's convenience.  1. Left ventricular systolic dysfunction: Underwent LHC with Dr. Gwenlyn Found yesterday showing known Prox-Mid RCA 100% stenosed lesion, Ost-Prox LAD 100% stenosed, Ost to Prox Cx lesion 90% stenosed, origin to Prox graft lesion 100% stenosed and Ost to LM, 80% stenosed. Had widely patent 2/3 grafts. No obvious explanation for his decline in LV function based on coronary anatomy. There was concern that dysfunction may be related to dyssynchronous LV activation by RV pacing? Will continue with medical therapy as indicated. Discuss with MD the plan regarding further testing, otherwise arrange for f/u in the office.   2. CHB: AV paced.  3. HTN: Borderline controlled. Encourage to monitor at home.   4. HLD: On statin  Signed, Reino Bellis NP-C Pager [phone]  I have seen and examined the patient along with Reino Bellis NP-C.  I have reviewed the chart, notes and new data.  I agree with PA's note.  Key new complaints:  groin feels fine, no ,ore bleeding Key examination changes: no hematoma or ecchymosis at access site Key new findings / data: cath films reviewed. AVR not crossed, EDP not recorded.  PLAN: No clear coronary cause for drop in LVEF or worsening dyspnea.  Will add a low dose of diuretic.  Arrange for consultation with EP to see if CRT upgrade would be a worthwhile option.  Sanda Klein, MD, [place] [phone] 10/30/2015, 9:18 AM

## 2015-10-30 NOTE — Discharge Summary (Signed)
Discharge Summary    Patient ID: Luis Townsend,  MRN: [id number], DOB/AGE: Sep 22, 1928 82 y.o.  Admit date: 10/29/2015 Discharge date: 10/30/2015  Primary Care Provider: Dorothyann Peng Primary Cardiologist: Dr. Sallyanne Kuster  Discharge Diagnoses    Active Problems:   Aortic stenosis, critical AS with TAVR 11/2011   S/P CABG x 5, stenosis to RCA VG 1995   Subclavian artery stenosis, left, pta and stent 07/29/11   Left ventricular systolic dysfunction   PAD (peripheral artery disease) (HCC)   Bleeding at insertion site   Allergies No Known Allergies  Diagnostic Studies/Procedures    LHC: 10/29/15  Conclusion     Prox RCA to Mid RCA lesion, 100 %stenosed.  Ost LAD to Prox LAD lesion, 100 %stenosed.  Ost LM lesion, 80 %stenosed.  Ost Cx to Prox Cx lesion, 90 %stenosed.  Origin to Prox Graft lesion, 100 %stenosed.   Diagnostic Diagram       _____________   History of Present Illness     82 y.o.malesinus bradycardia and complete heart block (pacemaker dependent), CAD S/P CABG, AS S/P TAVR, PAD with multiple vessel involvement (carotid stenosis S/P left carotid endarterectomy, S/P left subclavian stent), hyperlipidemia, hypertension and COPD who presented for follow up and found to have reduced left ventricular systolic function and EF down to 35-40%. Given this finding concern raised for possible restenosis/ occlusion of the LAD. Plan was made to proceed forward with LHC at the patient's convenience.  Hospital Course     Consultants: None   Underwent LHC with Dr. Gwenlyn Found on 10/29/15 showing known Prox-Mid RCA 100% stenosed lesion, Ost-Prox LAD 100% stenosed, Ost to Prox Cx lesion 90% stenosed, origin to Prox graft lesion 100% stenosed and Ost to LM, 80% stenosed. Had widely patent 2/3 grafts. No obvious explanation for his decline in LV function based on coronary anatomy.   He was observed overnight after having a re-bleed from his cath site. Morning labs were  stable. No complaints or further complications noted overnight. He was seen and assessed by Dr. Sallyanne Kuster on 9/19 and determined stable for discharge home. We have added low dose lasix of '20mg'$  daily given his reduced EF. I have arranged for a f/u in the office. Dr. Sallyanne Kuster has requested that the office arrange for EP consultation to determine whether CRT upgrade would be beneficial.  _____________  Discharge Vitals Blood pressure (!) 177/61, pulse 68, temperature 98.3 F (36.8 C), temperature source Oral, resp. rate (!) 23, height '5\' 5"'$  (1.651 m), weight 143 lb 1.3 oz (64.9 kg), SpO2 97 %.  Filed Weights   10/29/15 1320 10/29/15 2056 10/30/15 0345  Weight: 143 lb (64.9 kg) 143 lb 1.3 oz (64.9 kg) 143 lb 1.3 oz (64.9 kg)    Labs & Radiologic Studies    CBC  Recent Labs  10/29/15 2209 10/30/15 0343  WBC 6.1 7.8  HGB 12.7* 13.2  HCT 37.9* 40.2  MCV 96.4 96.9  PLT 146* 903*   Basic Metabolic Panel  Recent Labs  10/29/15 2209 10/30/15 0343  NA  --  140  K  --  4.7  CL  --  105  CO2  --  29  GLUCOSE  --  75  BUN  --  14  CREATININE 1.04 1.10  CALCIUM  --  8.8*   Liver Function Tests No results for input(s): AST, ALT, ALKPHOS, BILITOT, PROT, ALBUMIN in the last 72 hours. No results for input(s): LIPASE, AMYLASE in the last 72 hours. Cardiac Enzymes  No results for input(s): CKTOTAL, CKMB, CKMBINDEX, TROPONINI in the last 72 hours. BNP Invalid input(s): POCBNP D-Dimer No results for input(s): DDIMER in the last 72 hours. Hemoglobin A1C No results for input(s): HGBA1C in the last 72 hours. Fasting Lipid Panel No results for input(s): CHOL, HDL, LDLCALC, TRIG, CHOLHDL, LDLDIRECT in the last 72 hours. Thyroid Function Tests No results for input(s): TSH, T4TOTAL, T3FREE, THYROIDAB in the last 72 hours.  Invalid input(s): FREET3 _____________    Disposition   Pt is being discharged home today in good condition.  Follow-up Plans & Appointments    Follow-up  Information    Murray Hodgkins, NP Follow up on 11/13/2015.   Specialties:  Nurse Practitioner, Cardiology, Radiology Why:  10am for your hospital follow up.  Contact information: [address] [place] [address] [phone]          Discharge Instructions    Diet - low sodium heart healthy    Complete by:  As directed    Discharge instructions    Complete by:  As directed    Groin Site Care Refer to this sheet in the next few weeks. These instructions provide you with information on caring for yourself after your procedure. Your caregiver may also give you more specific instructions. Your treatment has been planned according to current medical practices, but problems sometimes occur. Call your caregiver if you have any problems or questions after your procedure. HOME CARE INSTRUCTIONS You may shower 24 hours after the procedure. Remove the bandage (dressing) and gently wash the site with plain soap and water. Gently pat the site dry.  Do not apply powder or lotion to the site.  Do not sit in a bathtub, swimming pool, or whirlpool for 5 to 7 days.  No bending, squatting, or lifting anything over 10 pounds (4.5 kg) as directed by your caregiver.  Inspect the site at least twice daily.  Do not drive home if you are discharged the same day of the procedure. Have someone else drive you.  You may drive 24 hours after the procedure unless otherwise instructed by your caregiver.  What to expect: Any bruising will usually fade within 1 to 2 weeks.  Blood that collects in the tissue (hematoma) may be painful to the touch. It should usually decrease in size and tenderness within 1 to 2 weeks.  SEEK IMMEDIATE MEDICAL CARE IF: You have unusual pain at the groin site or down the affected leg.  You have redness, warmth, swelling, or pain at the groin site.  You have drainage (other than a small amount of blood on the dressing).  You have chills.  You have a fever or persistent  symptoms for more than 72 hours.  You have a fever and your symptoms suddenly get worse.  Your leg becomes pale, cool, tingly, or numb.  You have heavy bleeding from the site. Hold pressure on the site. .   Increase activity slowly    Complete by:  As directed       Discharge Medications   Current Discharge Medication List    START taking these medications   Details  furosemide (LASIX) 20 MG tablet Take 1 tablet (20 mg total) by mouth daily. Qty: 90 tablet, Refills: 3      CONTINUE these medications which have NOT CHANGED   Details  albuterol (PROVENTIL HFA;VENTOLIN HFA) 108 (90 BASE) MCG/ACT inhaler Inhale 2 puffs into the lungs every 6 (six) hours as needed for wheezing or shortness  of breath. Qty: 1 Inhaler, Refills: 0    aspirin EC 81 MG tablet Take 81 mg by mouth daily.    carvedilol (COREG) 6.25 MG tablet Take 1 tablet (6.25 mg total) by mouth 2 (two) times daily with a meal. Qty: 60 tablet, Refills: 3    ramipril (ALTACE) 10 MG capsule TAKE 1 CAPSULE (10 MG TOTAL) BY MOUTH DAILY. Qty: 30 capsule, Refills: 10    SYMBICORT 160-4.5 MCG/ACT inhaler TAKE 2 PUFFS BY MOUTH TWICE A DAY Qty: 1 Inhaler, Refills: 5    rosuvastatin (CRESTOR) 40 MG tablet TAKE 1 TABLET BY MOUTH EVERY DAY Qty: 30 tablet, Refills: 3         Outstanding Labs/Studies   None  Duration of Discharge Encounter   Greater than 30 minutes including physician time.  Signed, Reino Bellis NP-C 10/30/2015, 9:49 AM

## 2015-10-30 NOTE — Care Management Note (Signed)
Case Management Note  Patient Details  Name: Luis Townsend MRN: [id number] Date of Birth: 1928-04-25  Subjective/Objective:   Patient is s/p cath , lives with spouse, pta indep.  For dc today no needs.                 Action/Plan:   Expected Discharge Date:                  Expected Discharge Plan:  Home/Self Care  In-House Referral:     Discharge planning Services  CM Consult  Post Acute Care Choice:    Choice offered to:     DME Arranged:    DME Agency:     HH Arranged:    HH Agency:     Status of Service:  Completed, signed off  If discussed at [employer] of Stay Meetings, dates discussed:    Additional Comments:  Zenon Mayo, RN 10/30/2015, 11:14 AM

## 2015-10-30 NOTE — Care Management Obs Status (Signed)
[facility] NOTIFICATION   Patient Details  Name: Luis Townsend MRN: [id number] Date of Birth: Apr 11, 1928   Medicare Observation Status Notification Given:  Yes    Zenon Mayo, RN 10/30/2015, 11:13 AM

## 2015-10-31 ENCOUNTER — Other Ambulatory Visit: Payer: Self-pay | Admitting: Cardiovascular Disease

## 2015-10-31 MED ORDER — CARVEDILOL 6.25 MG PO TABS: 6.2500 mg | ORAL_TABLET | Freq: Two times a day (BID) | ORAL | 3 refills | Status: DC

## 2015-11-01 ENCOUNTER — Telehealth: Payer: Self-pay | Admitting: Cardiovascular Disease

## 2015-11-01 NOTE — Telephone Encounter (Signed)
Closed encounter °

## 2015-11-05 ENCOUNTER — Other Ambulatory Visit: Payer: Self-pay | Admitting: Cardiovascular Disease

## 2015-11-13 ENCOUNTER — Ambulatory Visit: Payer: Medicare Other | Admitting: Nurse Practitioner

## 2015-11-14 ENCOUNTER — Other Ambulatory Visit: Payer: Self-pay | Admitting: Cardiology

## 2015-11-14 ENCOUNTER — Ambulatory Visit (INDEPENDENT_AMBULATORY_CARE_PROVIDER_SITE_OTHER): Payer: Medicare Other | Admitting: Cardiology

## 2015-11-14 ENCOUNTER — Encounter: Payer: Self-pay | Admitting: Cardiology

## 2015-11-14 ENCOUNTER — Encounter: Payer: Self-pay | Admitting: *Deleted

## 2015-11-14 VITALS — BP 160/92 | HR 91 | Ht 65.0 in | Wt 143.0 lb

## 2015-11-14 DIAGNOSIS — Z01812 Encounter for preprocedural laboratory examination: Secondary | ICD-10-CM

## 2015-11-14 DIAGNOSIS — I428 Other cardiomyopathies: Secondary | ICD-10-CM | POA: Diagnosis not present

## 2015-11-14 NOTE — Progress Notes (Signed)
Thanks, Will

## 2015-11-14 NOTE — Patient Instructions (Addendum)
Medication Instructions:    Your physician recommends that you continue on your current medications as directed. Please refer to the Current Medication list given to you today.  --- If you need a refill on your cardiac medications before your next appointment, please call your pharmacy. ---  Labwork:  Your physician recommends that you return for lab work in: one week for BMET & CBC w/ diff  Testing/Procedures: Your physician has recommended that you have a pacemaker upgrade to a BiVentricular pacemaker.  Please see instruction sheet given to you today.  Follow-Up:  Your physician recommends that you schedule a wound check appointment, after your procedure on 11/29/2015, with the device clinic.   Your physician recommends that you schedule a follow up appointment in 3 months, after your procedure on 11/29/2015, with Dr. Curt Bears.  Thank you for choosing [facility]!!   Trinidad Curet, RN [phone]   Any Other Special Instructions Will Be Listed Below (If Applicable).  Biventricular Pacemaker Implantation A pacemaker is a small, lightweight, battery-powered device that is placed (implanted) under the skin in the upper chest. Your health care provider may prescribe a pacemaker for you if your heartbeat is too slow (bradycardia). A biventricular pacemaker is a pulse generator connected by wires called leads that go into the two lower chambers on the right and left sides of your heart (ventricles). It is used to treat symptoms of heart failure. The pulse generator is a small computer run by a battery. The generator creates a regular electronic pulse. The pulse is sent through the leads, which go through a blood vessel and into the ventricles of your heart. This type of pacemaker makes a weak heart more efficient. LET [facility] CARE PROVIDER KNOW ABOUT:  Any allergies. Some allergies can cause serious problems during the procedure. Allergies to shellfish or agents, such as iodine,  used in liquids that enhance specific areas of your body on X-ray images (contrast dyes) are especially problematic.   All medicines you take. These include vitamins, herbs, eye drops, over-the-counter medicines, and creams.   Use of steroids.   Problems with numbing medicines (anesthetics).  Bleeding problems.   Past surgeries.   Other health problems. RISKS AND COMPLICATIONS Implanting a biventricular pacemaker is usually a safe procedure but problems can occur. For example:   Too much bleeding may occur.   Infection may develop.   Blood vessels, your lungs, or your heart may be harmed.   The pacemaker may not make your condition better. BEFORE THE PROCEDURE   You may need to have blood tests, heart tests, or a chest X-ray done before the day of the procedure.   Ask your health care provider about changing or stopping your regular medicines.   Make plans to have someone drive you home.  Stop smoking at least 24 hours before the procedure.  Take a bath or shower the night before the procedure. You may need to scrub your chest with a special type of soap.  Do not eat or drink anything after midnight the night before your procedure. Ask if it is okay to take any needed medicine with a small sip of water. PROCEDURE The procedure to put a pacemaker in your chest is usually done at a hospital in a room that has a large X-ray machine called a fluoroscope. The machine will be above you during the procedure. It will help your health care provider see your heart during the procedure. Before the procedure:   Small monitors will  be put on your body. They will be used to check your heart, blood pressure, and oxygen level.  A needle will be put into a vein in your hand or arm. This is called an intravenous (IV) access tube. Fluids and medicine will flow directly into your body through the IV tube.  Your chest will be cleaned with a germ-killing (antiseptic) solution. Your  chest may be shaved.  You may be given medicine to help you relax (sedative).   You will be given a numbing medicine called a local anesthetic. This medicine will make your chest area have no feeling while the pacemaker is implanted. You will be sleepy but awake during the procedure. After you are numb, the procedure will begin. The health care provider will:   Make a small cut (incision). This will make a pocket deep under your skin that will hold the pulse generator.  Guide the leads through a large blood vessel into your heart and attach them to the heart muscles.  Test the pacemaker.  Close the incision with stitches, glue, or staples. AFTER THE PROCEDURE  You may feel pain. Some pain is normal. It may last a few days.   You may stay in a recovery area until the local anesthetic has worn off. Your blood pressure and pulse will be checked often. You will be taken to a room where your heartbeat will be monitored.  A chest X-ray will be taken. This checks that the pacemaker is in the right place.  The pacemaker will be checked before you go home. It can be adjusted if that is needed.   This information is not intended to replace advice given to you by your health care provider. Make sure you discuss any questions you have with your health care provider.   Document Released: 10/22/2011 Document Revised: 02/17/2014 Document Reviewed: 10/22/2011 Elsevier Interactive Patient Education [employer].

## 2015-11-14 NOTE — Progress Notes (Signed)
Electrophysiology Office Note   Date:  11/14/2015   ID:  Luis Townsend, DOB 05-Jun-1928, MRN [id number]  PCP:  Dorothyann Peng, NP  Cardiologist:  [place] Primary Electrophysiologist:  Luis Westley Meredith Leeds, MD    Chief Complaint  Patient presents with  . Pacemaker Check    discuss CRT upgrade     History of Present Illness: Luis Townsend is a 82 y.o. male who presents today for electrophysiology evaluation.   Hx sinus bradycardia and complete heart block (pacemaker dependent), CAD S/P CABG, AS S/P TAVR, PAD with multiple vessel involvement (carotid stenosis S/P left carotid endarterectomy, S/P left subclavian stent), hyperlipidemia, hypertension and COPD. Pacemaker interrogation July 28, shows 82.5% atrial pacing and 100% ventricular pacing without evidence of atrial fibrillation or ventricular tachycardia. He is pacemaker dependent. Follow-up echocardiogram performed on August 3 shows a drastic and unexpected reduction left ventricular systolic function, with EF down to 35-40%and diffuse hypokinesis. Mention is made of pronounced dyssynchrony secondary to right ventricular pacing. TAVR function is normal. Mean gradient across the prosthesis is 12 mmHg and there is only trivial perivalvular regurgitation.    Today, he denies symptoms of palpitations, chest pain,  orthopnea, PND, lower extremity edema, claudication, dizziness, presyncope, syncope, bleeding, or neurologic sequela. The patient is tolerating medications without difficulties and is otherwise without complaint today. He does say that he has noted that he has been a little bit more short of breath with exertion over the past few months. He does not complain of PND or orthopnea.  Past Medical History:  Diagnosis Date  . Amaurosis fugax of left eye 10/11/1996  . Aortic stenosis 06/30/2011   severe  . Aortoiliac occlusive disease ([place]) 06/30/2011  . Cerebrovascular disease 06/30/2011  . CHB (complete heart block) ([place]) 02/03/2013    . COPD (chronic obstructive pulmonary disease), moderat to severe 07/29/2011  . Coronary artery disease    a. 10/29/15 Cath for decreased EF with patent 2/3 grafts, no change   . Hyperlipidemia   . Hypertension   . Myocardial infarction 03/22/1994   acute LAD occlusion  . PAD (peripheral artery disease) ([place])   . RBBB (right bundle branch block)   . S/P CABG x 5 03/28/1994   LIMA to LAD, sequential SVG to OM1-OM2-OM4, SVG to RCA, open vein harvest right leg - Dr Redmond Pulling  . S/P carotid endarterectomy 10/28/1996   Dr Donnetta Hutching  . Subclavian artery stenosis, left ([place]) 06/30/2011  . Tobacco abuse    Past Surgical History:  Procedure Laterality Date  . BILATERAL UPPER EXTREMITY ANGIOGRAM N/A 07/29/2011   Procedure: BILATERAL UPPER EXTREMITY ANGIOGRAM;  Surgeon: Lorretta Harp, MD;  Location[facility] C[facility] LAB;  Service: Cardiovascular;  Laterality: N/A;  . CARDIAC CATHETERIZATION  06/24/2011   No intervention - Recommendation-angioplasty/stenting of Lft subclavian artery, high risk replacement of aortic valve w/ biological prosthesis and redo CABG w/ preservation of LIMA bypass and new SVG to PDA and OM, reevaluate carotid arteries and pulmonary function prior to surgery.  Marland Kitchen CARDIAC CATHETERIZATION N/A 10/29/2015   Procedure: Coronary/Graft Angiography;  Surgeon: Lorretta Harp, MD;  Location: [place] CV LAB;  Service: Cardiovascular;  Laterality: N/A;  . CARDIAC VALVE REPLACEMENT    . CARDIOVASCULAR STRESS TEST  07/10/2009   Evidence of mild ischemia in Basal Anteriolateral and Mid Anterolateral regions-additional diaphragmatic attenuation. No ECG changes. Low risk.\  . CAROTID DOPPLER  10/18/2012   Rt bulb/prox ICA demonstrated 50-69% diameter reduction, Lft mid ICA demonstrated 50-69% diameter reduction, Lft subclavian- =  to or <50% sig diameter reduction, Lft vertebral-appeared occluded  . CAROTID ENDARTERECTOMY  10/28/96  DR. TODD EARLY   LEFT AND DACRON PATCH ANGIOPLASTY  . CORONARY ARTERY BYPASS  GRAFT  03/28/94 DR. CHARLES WILSON   x5(LIMA to LAD,SVG to OM1-OM2-OM4, SVG to RCA  . EYE SURGERY    . LEFT HEART CATHETERIZATION WITH CORONARY/GRAFT ANGIOGRAM N/A 06/24/2011   Procedure: LEFT HEART CATHETERIZATION WITH Beatrix Fetters;  Surgeon: Sanda Klein, MD;  Location: [place] CATH LAB;  Service: Cardiovascular;  Laterality: N/A;  . PACEMAKER INSERTION  11/12/2011   Medtronic Adapta; model# ADDR01, serial# [id number]  . PERIPHERAL VASCULAR ANGIOPLASTY  07/29/2011   Lft Subclavian 90% stenosis, stented w/ a 7x64m long Cordis Genesis resulting in reduction of 90% stenosos to 0% resdiual.  . TRANSTHORACIC ECHOCARDIOGRAM  08/05/2012   EF 50-55%, septial motion showed abnormal function and dyssynergy, LA mild-moderately dilated     Current Outpatient Prescriptions  Medication Sig Dispense Refill  . albuterol (PROVENTIL HFA;VENTOLIN HFA) 108 (90 BASE) MCG/ACT inhaler Inhale 2 puffs into the lungs every 6 (six) hours as needed for wheezing or shortness of breath. 1 Inhaler 0  . aspirin EC 81 MG tablet Take 81 mg by mouth daily.    . carvedilol (COREG) 6.25 MG tablet Take 1 tablet (6.25 mg total) by mouth 2 (two) times daily with a meal. 60 tablet 3  . furosemide (LASIX) 20 MG tablet Take 1 tablet (20 mg total) by mouth daily. 90 tablet 3  . ramipril (ALTACE) 10 MG capsule TAKE 1 CAPSULE (10 MG TOTAL) BY MOUTH DAILY. 30 capsule 10  . rosuvastatin (CRESTOR) 40 MG tablet TAKE 1 TABLET BY MOUTH EVERY DAY 30 tablet 3  . SYMBICORT 160-4.5 MCG/ACT inhaler TAKE 2 PUFFS BY MOUTH TWICE A DAY 1 Inhaler 5   No current facility-administered medications for this visit.     Allergies:   Review of patient's allergies indicates no known allergies.   Social History:  The patient  reports that he quit smoking about 4 years ago. He has never used smokeless tobacco. He reports that he does not drink alcohol or use drugs.   Family History:  The patient's family history includes Diabetes in his maternal  grandfather; Heart attack in his father and paternal grandmother; Heart disease in his father; Pneumonia in his maternal grandmother; Stroke in his mother.    ROS:  Please see the history of present illness.   Otherwise, review of systems is positive for none.   All other systems are reviewed and negative.    PHYSICAL EXAM: VS:  BP (!) 160/92   Pulse 91   Ht '5\' 5"'$  (1.651 m)   Wt 143 lb (64.9 kg)   BMI 23.80 kg/m  , BMI Body mass index is 23.8 kg/m. GEN: Well nourished, well developed, in no acute distress  HEENT: normal  Neck: no JVD, carotid bruits, or masses Cardiac: RRR; no murmurs, rubs, or gallops,no edema  Respiratory:  clear to auscultation bilaterally, normal work of breathing GI: soft, nontender, nondistended, + BS MS: no deformity or atrophy  Skin: warm and dry,  device pocket is well healed Neuro:  Strength and sensation are intact Psych: euthymic mood, full affect  EKG:  EKG is ordered today. Personal review of the ekg ordered shows AV paced  Recent Labs: 08/28/2015: ALT 20 10/30/2015: BUN 14; Creatinine, Ser 1.10; Hemoglobin 13.2; Platelets 146; Potassium 4.7; Sodium 140    Lipid Panel     Component Value Date/Time  CHOL 176 08/28/2015 0836   TRIG 58 08/28/2015 0836   HDL 67 08/28/2015 0836   CHOLHDL 2.6 08/28/2015 0836   VLDL 12 08/28/2015 0836   LDLCALC 97 08/28/2015 0836     Wt Readings from Last 3 Encounters:  11/14/15 143 lb (64.9 kg)  10/30/15 143 lb 1.3 oz (64.9 kg)  10/11/15 143 lb 12.8 oz (65.2 kg)      Other studies Reviewed: Additional studies/ records that were reviewed today include: TTE 09/13/15, Coronary angiography 10/29/15  Review of the above records today demonstrates:  - Left ventricle: The cavity size was normal. Wall thickness was   increased in a pattern of mild LVH. Septal-lateral dyssynchrony   noted. Indeterminant diastolic function. Systolic function was   moderately reduced. The estimated ejection fraction was in the    range of 35% to 40%. Diffuse hypokinesis. - Aortic valve: Bioprosthetic aortic valve s/p TAVR. No significant   stenosis. Trivial peri-valvular regurgitation. Mean gradient (S):   12 mm Hg. - Mitral valve: There was trivial regurgitation. - Right ventricle: The cavity size was normal. Pacer wire or   catheter noted in right ventricle. Systolic function was mildly   reduced. - Pulmonary arteries: No complete TR doppler jet so unable to   estimate PA systolic pressure. - Inferior vena cava: The vessel was normal in size. The   respirophasic diameter changes were in the normal range (= 50%),   consistent with normal central venous pressure.   Prox RCA to Mid RCA lesion, 100 %stenosed.  Ost LAD to Prox LAD lesion, 100 %stenosed.  Ost LM lesion, 80 %stenosed.  Ost Cx to Prox Cx lesion, 90 %stenosed.  Origin to Prox Graft lesion, 100 %stenosed.   ASSESSMENT AND PLAN:  1.  Complete AV block: s/p dual chamber pacemaker. Functioning appropriately last pacemaker check.  2. systolc heart failure: Newly found ejection fraction of 35-40%. He is RV pacing vast majority of the time and this could possibly be the cause of his heart failure. I discussed with him the possibility of an upgrade to a CRT-P. Risks and benefits of the procedure were discussed. Risks include bleeding, tamponade, infection, and pneumothorax. He understands these risks and has agreed to the procedure.  3. Aortic stenosis: s/p TAVR 2013  4. CAD s/p CABG: no angina  5. Hypertension: 110/60 on recheck.  No changes made today. Current medicines are reviewed at length with the patient today.   The patient has concerns regarding his medicines.  The following changes were made today:  none  Labs/ tests ordered today include:  No orders of the defined types were placed in this encounter.    Disposition:   FU with Redith Drach  3 months  Signed, Mettie Roylance Meredith Leeds, MD  11/14/2015 11:59 AM     [facility] [address] [place] [address] [phone] (office) [phone] (fax)

## 2015-11-15 DIAGNOSIS — H353211 Exudative age-related macular degeneration, right eye, with active choroidal neovascularization: Secondary | ICD-10-CM | POA: Diagnosis not present

## 2015-11-19 DIAGNOSIS — Z23 Encounter for immunization: Secondary | ICD-10-CM | POA: Diagnosis not present

## 2015-11-21 ENCOUNTER — Other Ambulatory Visit: Payer: Medicare Other | Admitting: *Deleted

## 2015-11-21 ENCOUNTER — Other Ambulatory Visit: Payer: Self-pay | Admitting: *Deleted

## 2015-11-21 DIAGNOSIS — R799 Abnormal finding of blood chemistry, unspecified: Secondary | ICD-10-CM

## 2015-11-21 DIAGNOSIS — Z01812 Encounter for preprocedural laboratory examination: Secondary | ICD-10-CM

## 2015-11-21 DIAGNOSIS — I428 Other cardiomyopathies: Secondary | ICD-10-CM | POA: Diagnosis not present

## 2015-11-21 LAB — CBC WITH DIFFERENTIAL/PLATELET
BASOS ABS: 0 {cells}/uL (ref 0–200)
Basophils Relative: 0 %
EOS ABS: 172 {cells}/uL (ref 15–500)
EOS PCT: 2 %
HCT: 39.2 % (ref 38.5–50.0)
Hemoglobin: 13.5 g/dL (ref 13.2–17.1)
LYMPHS PCT: 18 %
Lymphs Abs: 1548 cells/uL (ref 850–3900)
MCH: 32.8 pg (ref 27.0–33.0)
MCHC: 34.4 g/dL (ref 32.0–36.0)
MCV: 95.4 fL (ref 80.0–100.0)
MONOS PCT: 10 %
MPV: 10.8 fL (ref 7.5–12.5)
Monocytes Absolute: 860 cells/uL (ref 200–950)
NEUTROS ABS: 6020 {cells}/uL (ref 1500–7800)
NEUTROS PCT: 70 %
PLATELETS: 145 10*3/uL (ref 140–400)
RBC: 4.11 MIL/uL — ABNORMAL LOW (ref 4.20–5.80)
RDW: 13.1 % (ref 11.0–15.0)
WBC: 8.6 10*3/uL (ref 3.8–10.8)

## 2015-11-21 LAB — BASIC METABOLIC PANEL
BUN: 33 mg/dL — AB (ref 7–25)
CALCIUM: 8.7 mg/dL (ref 8.6–10.3)
CO2: 29 mmol/L (ref 20–31)
CREATININE: 1.25 mg/dL — AB (ref 0.70–1.11)
Chloride: 104 mmol/L (ref 98–110)
Glucose, Bld: 97 mg/dL (ref 65–99)
Potassium: 3.9 mmol/L (ref 3.5–5.3)
Sodium: 142 mmol/L (ref 135–146)

## 2015-11-22 ENCOUNTER — Other Ambulatory Visit: Payer: Self-pay | Admitting: *Deleted

## 2015-11-26 ENCOUNTER — Other Ambulatory Visit: Payer: Medicare Other | Admitting: *Deleted

## 2015-11-26 DIAGNOSIS — R799 Abnormal finding of blood chemistry, unspecified: Secondary | ICD-10-CM

## 2015-11-26 LAB — BASIC METABOLIC PANEL
BUN: 22 mg/dL (ref 7–25)
CHLORIDE: 106 mmol/L (ref 98–110)
CO2: 29 mmol/L (ref 20–31)
Calcium: 8.7 mg/dL (ref 8.6–10.3)
Creat: 1.14 mg/dL — ABNORMAL HIGH (ref 0.70–1.11)
GLUCOSE: 82 mg/dL (ref 65–99)
POTASSIUM: 4.4 mmol/L (ref 3.5–5.3)
SODIUM: 142 mmol/L (ref 135–146)

## 2015-11-27 ENCOUNTER — Other Ambulatory Visit: Payer: Self-pay

## 2015-11-27 ENCOUNTER — Ambulatory Visit (INDEPENDENT_AMBULATORY_CARE_PROVIDER_SITE_OTHER): Payer: Medicare Other | Admitting: *Deleted

## 2015-11-27 DIAGNOSIS — I442 Atrioventricular block, complete: Secondary | ICD-10-CM

## 2015-11-27 MED ORDER — ROSUVASTATIN CALCIUM 40 MG PO TABS: 40.0000 mg | ORAL_TABLET | Freq: Every day | ORAL | 3 refills | Status: DC

## 2015-11-27 NOTE — Progress Notes (Signed)
Remote pacemaker transmission.   

## 2015-11-29 ENCOUNTER — Ambulatory Visit (HOSPITAL_COMMUNITY)
Admission: RE | Admit: 2015-11-29 | Discharge: 2015-11-30 | Disposition: A | Discharge status: Home / Self Care | Payer: Medicare Other | Source: Ambulatory Visit | Attending: Cardiology | Admitting: Cardiology

## 2015-11-29 ENCOUNTER — Encounter (HOSPITAL_COMMUNITY)
Admission: RE | Disposition: A | Discharge status: Home / Self Care | Payer: Self-pay | Source: Ambulatory Visit | Attending: Cardiology

## 2015-11-29 ENCOUNTER — Encounter (HOSPITAL_COMMUNITY): Payer: Self-pay | Admitting: Cardiology

## 2015-11-29 DIAGNOSIS — I2510 Atherosclerotic heart disease of native coronary artery without angina pectoris: Secondary | ICD-10-CM | POA: Diagnosis not present

## 2015-11-29 DIAGNOSIS — I350 Nonrheumatic aortic (valve) stenosis: Secondary | ICD-10-CM | POA: Insufficient documentation

## 2015-11-29 DIAGNOSIS — Z950 Presence of cardiac pacemaker: Secondary | ICD-10-CM | POA: Diagnosis present

## 2015-11-29 DIAGNOSIS — E785 Hyperlipidemia, unspecified: Secondary | ICD-10-CM | POA: Diagnosis not present

## 2015-11-29 DIAGNOSIS — Z951 Presence of aortocoronary bypass graft: Secondary | ICD-10-CM | POA: Diagnosis not present

## 2015-11-29 DIAGNOSIS — Z952 Presence of prosthetic heart valve: Secondary | ICD-10-CM | POA: Insufficient documentation

## 2015-11-29 DIAGNOSIS — Z01818 Encounter for other preprocedural examination: Secondary | ICD-10-CM | POA: Diagnosis not present

## 2015-11-29 DIAGNOSIS — J449 Chronic obstructive pulmonary disease, unspecified: Secondary | ICD-10-CM | POA: Diagnosis present

## 2015-11-29 DIAGNOSIS — I519 Heart disease, unspecified: Secondary | ICD-10-CM | POA: Diagnosis present

## 2015-11-29 DIAGNOSIS — I110 Hypertensive heart disease with heart failure: Secondary | ICD-10-CM | POA: Diagnosis not present

## 2015-11-29 DIAGNOSIS — I6529 Occlusion and stenosis of unspecified carotid artery: Secondary | ICD-10-CM | POA: Diagnosis present

## 2015-11-29 DIAGNOSIS — I428 Other cardiomyopathies: Secondary | ICD-10-CM

## 2015-11-29 DIAGNOSIS — I429 Cardiomyopathy, unspecified: Secondary | ICD-10-CM | POA: Insufficient documentation

## 2015-11-29 DIAGNOSIS — I442 Atrioventricular block, complete: Secondary | ICD-10-CM | POA: Diagnosis present

## 2015-11-29 DIAGNOSIS — I10 Essential (primary) hypertension: Secondary | ICD-10-CM | POA: Diagnosis present

## 2015-11-29 DIAGNOSIS — I679 Cerebrovascular disease, unspecified: Secondary | ICD-10-CM | POA: Diagnosis present

## 2015-11-29 DIAGNOSIS — I447 Left bundle-branch block, unspecified: Secondary | ICD-10-CM | POA: Diagnosis not present

## 2015-11-29 DIAGNOSIS — Z95818 Presence of other cardiac implants and grafts: Secondary | ICD-10-CM

## 2015-11-29 DIAGNOSIS — I5189 Other ill-defined heart diseases: Secondary | ICD-10-CM | POA: Diagnosis present

## 2015-11-29 DIAGNOSIS — I5022 Chronic systolic (congestive) heart failure: Secondary | ICD-10-CM

## 2015-11-29 HISTORY — PX: EP IMPLANTABLE DEVICE: SHX172B

## 2015-11-29 HISTORY — DX: Presence of cardiac pacemaker: Z95.0

## 2015-11-29 LAB — SURGICAL PCR SCREEN
MRSA, PCR: NEGATIVE
STAPHYLOCOCCUS AUREUS: NEGATIVE

## 2015-11-29 SURGERY — BIV UPGRADE
Anesthesia: LOCAL

## 2015-11-29 MED ORDER — CEFAZOLIN SODIUM-DEXTROSE 2-4 GM/100ML-% IV SOLN
2.0000 g | INTRAVENOUS | Status: AC
  Administered 2015-11-29: 2 g via INTRAVENOUS
  Filled 2015-11-29: qty 100

## 2015-11-29 MED ORDER — MIDAZOLAM HCL 5 MG/5ML IJ SOLN
INTRAMUSCULAR | Status: AC
  Filled 2015-11-29: qty 5

## 2015-11-29 MED ORDER — IOPAMIDOL (ISOVUE-370) INJECTION 76%
INTRAVENOUS | Status: AC
  Filled 2015-11-29: qty 50

## 2015-11-29 MED ORDER — HEPARIN (PORCINE) IN NACL 2-0.9 UNIT/ML-% IJ SOLN
INTRAMUSCULAR | Status: DC | PRN
  Administered 2015-11-29: 500 mL

## 2015-11-29 MED ORDER — MUPIROCIN 2 % EX OINT
TOPICAL_OINTMENT | CUTANEOUS | Status: AC
  Administered 2015-11-29: 1 via TOPICAL
  Filled 2015-11-29: qty 22

## 2015-11-29 MED ORDER — CEFAZOLIN IN D5W 1 GM/50ML IV SOLN
1.0000 g | Freq: Four times a day (QID) | INTRAVENOUS | Status: AC
  Administered 2015-11-29 – 2015-11-30 (×3): 1 g via INTRAVENOUS
  Filled 2015-11-29 (×3): qty 50

## 2015-11-29 MED ORDER — SODIUM CHLORIDE 0.9 % IR SOLN
80.0000 mg | Status: AC
  Administered 2015-11-29: 80 mg
  Filled 2015-11-29: qty 2

## 2015-11-29 MED ORDER — ACETAMINOPHEN 325 MG PO TABS: 325.0000 mg | ORAL_TABLET | ORAL | Status: DC | PRN

## 2015-11-29 MED ORDER — MIDAZOLAM HCL 5 MG/5ML IJ SOLN
INTRAMUSCULAR | Status: DC | PRN
  Administered 2015-11-29 (×2): 1 mg via INTRAVENOUS

## 2015-11-29 MED ORDER — FENTANYL CITRATE (PF) 100 MCG/2ML IJ SOLN
INTRAMUSCULAR | Status: AC
  Filled 2015-11-29: qty 2

## 2015-11-29 MED ORDER — FENTANYL CITRATE (PF) 100 MCG/2ML IJ SOLN
INTRAMUSCULAR | Status: DC | PRN
Start: 2015-11-29 — End: 2015-11-29
  Administered 2015-11-29 (×2): 25 ug via INTRAVENOUS

## 2015-11-29 MED ORDER — MUPIROCIN 2 % EX OINT
1.0000 "application " | TOPICAL_OINTMENT | Freq: Once | CUTANEOUS | Status: AC
  Administered 2015-11-29: 1 via TOPICAL
  Filled 2015-11-29: qty 22

## 2015-11-29 MED ORDER — ONDANSETRON HCL 4 MG/2ML IJ SOLN: 4.0000 mg | Freq: Four times a day (QID) | INTRAMUSCULAR | Status: DC | PRN

## 2015-11-29 MED ORDER — CARVEDILOL 6.25 MG PO TABS
6.2500 mg | ORAL_TABLET | Freq: Two times a day (BID) | ORAL | Status: DC
  Administered 2015-11-29 – 2015-11-30 (×2): 6.25 mg via ORAL
  Filled 2015-11-29 (×2): qty 1

## 2015-11-29 MED ORDER — SODIUM CHLORIDE 0.9 % IR SOLN
Status: AC
  Filled 2015-11-29: qty 2

## 2015-11-29 MED ORDER — ASPIRIN EC 81 MG PO TBEC
81.0000 mg | DELAYED_RELEASE_TABLET | Freq: Every day | ORAL | Status: DC
  Administered 2015-11-29 – 2015-11-30 (×2): 81 mg via ORAL
  Filled 2015-11-29 (×2): qty 1

## 2015-11-29 MED ORDER — CEFAZOLIN SODIUM-DEXTROSE 2-4 GM/100ML-% IV SOLN
INTRAVENOUS | Status: AC
  Filled 2015-11-29: qty 100

## 2015-11-29 MED ORDER — RAMIPRIL 2.5 MG PO CAPS
2.5000 mg | ORAL_CAPSULE | Freq: Two times a day (BID) | ORAL | Status: DC
  Administered 2015-11-29 – 2015-11-30 (×3): 2.5 mg via ORAL
  Filled 2015-11-29 (×3): qty 1

## 2015-11-29 MED ORDER — ROSUVASTATIN CALCIUM 10 MG PO TABS
40.0000 mg | ORAL_TABLET | Freq: Every day | ORAL | Status: DC
  Administered 2015-11-29 – 2015-11-30 (×2): 40 mg via ORAL
  Filled 2015-11-29 (×2): qty 4

## 2015-11-29 MED ORDER — LIDOCAINE HCL (PF) 1 % IJ SOLN
INTRAMUSCULAR | Status: AC
  Filled 2015-11-29: qty 60

## 2015-11-29 MED ORDER — LIDOCAINE HCL (PF) 1 % IJ SOLN
INTRAMUSCULAR | Status: DC | PRN
  Administered 2015-11-29: 45 mL via INTRADERMAL

## 2015-11-29 MED ORDER — ALBUTEROL SULFATE (2.5 MG/3ML) 0.083% IN NEBU
2.5000 mg | INHALATION_SOLUTION | Freq: Four times a day (QID) | RESPIRATORY_TRACT | Status: DC | PRN
  Administered 2015-11-30: 2.5 mg via RESPIRATORY_TRACT
  Filled 2015-11-29: qty 3

## 2015-11-29 MED ORDER — IOPAMIDOL (ISOVUE-370) INJECTION 76%
INTRAVENOUS | Status: DC | PRN
  Administered 2015-11-29 (×2): 15 mL via INTRAVENOUS

## 2015-11-29 MED ORDER — SODIUM CHLORIDE 0.9 % IV SOLN
INTRAVENOUS | Status: DC
  Administered 2015-11-29: 06:00:00 via INTRAVENOUS
  Administered 2015-11-29: 250 mL via INTRAVENOUS

## 2015-11-29 SURGICAL SUPPLY — 17 items
BALLN ATTAIN 80 (BALLOONS) ×2
BALLOON ATTAIN 80 (BALLOONS) ×1 IMPLANT
CABLE SURGICAL S-101-97-12 (CABLE) ×2 IMPLANT
CATH ATTAIN COM SURV 6250V-MB2 (CATHETERS) ×2 IMPLANT
CATH ATTAIN SELECT 6238TEL (CATHETERS) ×2 IMPLANT
DEVICE CRTP PERCEPTA QUAD MRI (Pacemaker) ×2 IMPLANT
DEVICE DISSECT PLASMABLAD 3.0S (MISCELLANEOUS) ×1 IMPLANT
GUIDEWIRE ANGLED .035X150CM (WIRE) ×2 IMPLANT
KIT ESSENTIALS PG (KITS) ×2 IMPLANT
LEAD ATTAIN PERFOMA 4298-88CM (Lead) ×2 IMPLANT
PAD DEFIB LIFELINK (PAD) ×2 IMPLANT
PLASMABLADE 3.0S (MISCELLANEOUS) ×2
SHEATH CLASSIC 9.5F (SHEATH) ×2 IMPLANT
SLITTER 6230UNI (MISCELLANEOUS) ×2 IMPLANT
TRAY PACEMAKER INSERTION (PACKS) ×2 IMPLANT
WIRE ACUITY WHISPER EDS 4648 (WIRE) ×2 IMPLANT
WIRE HI TORQ VERSACORE-J 145CM (WIRE) ×4 IMPLANT

## 2015-11-29 NOTE — Plan of Care (Signed)
Problem: Activity: Goal: Risk for activity intolerance will decrease Outcome: Progressing Patient on bedrest until morning

## 2015-11-29 NOTE — H&P (Signed)
Luis Townsend is a 82 y.o. male with a history of systolic heart failure, CHF, CAD s/p CABG and aortic stenosis s/p TAVR.  He is device dependent and has an EF of 30-35% and thus presents for CRT upgrade.  On exam, regular rhythm, no murmurs, lungs clear. Risks and benefits discussed.  Risks include but not limited to bleeding, infection, tamponade, pneumothorax.  The patient understands the risks and has agreed to the procedure.  Arica Bevilacqua Curt Bears, MD 11/29/2015 7:08 AM

## 2015-11-30 ENCOUNTER — Encounter: Payer: Self-pay | Admitting: Cardiology

## 2015-11-30 ENCOUNTER — Encounter (HOSPITAL_COMMUNITY): Payer: Self-pay | Admitting: Physician Assistant

## 2015-11-30 ENCOUNTER — Ambulatory Visit (HOSPITAL_COMMUNITY): Payer: Medicare Other

## 2015-11-30 DIAGNOSIS — E785 Hyperlipidemia, unspecified: Secondary | ICD-10-CM | POA: Diagnosis not present

## 2015-11-30 DIAGNOSIS — I429 Cardiomyopathy, unspecified: Secondary | ICD-10-CM | POA: Diagnosis not present

## 2015-11-30 DIAGNOSIS — I447 Left bundle-branch block, unspecified: Secondary | ICD-10-CM | POA: Diagnosis not present

## 2015-11-30 DIAGNOSIS — I428 Other cardiomyopathies: Secondary | ICD-10-CM | POA: Diagnosis not present

## 2015-11-30 DIAGNOSIS — Z951 Presence of aortocoronary bypass graft: Secondary | ICD-10-CM | POA: Diagnosis not present

## 2015-11-30 DIAGNOSIS — Z950 Presence of cardiac pacemaker: Secondary | ICD-10-CM | POA: Diagnosis present

## 2015-11-30 DIAGNOSIS — J449 Chronic obstructive pulmonary disease, unspecified: Secondary | ICD-10-CM | POA: Diagnosis not present

## 2015-11-30 DIAGNOSIS — I442 Atrioventricular block, complete: Secondary | ICD-10-CM | POA: Diagnosis not present

## 2015-11-30 DIAGNOSIS — I110 Hypertensive heart disease with heart failure: Secondary | ICD-10-CM | POA: Diagnosis not present

## 2015-11-30 DIAGNOSIS — I350 Nonrheumatic aortic (valve) stenosis: Secondary | ICD-10-CM | POA: Diagnosis not present

## 2015-11-30 DIAGNOSIS — Z01818 Encounter for other preprocedural examination: Secondary | ICD-10-CM | POA: Diagnosis not present

## 2015-11-30 DIAGNOSIS — Z952 Presence of prosthetic heart valve: Secondary | ICD-10-CM | POA: Diagnosis not present

## 2015-11-30 DIAGNOSIS — I509 Heart failure, unspecified: Secondary | ICD-10-CM | POA: Diagnosis not present

## 2015-11-30 DIAGNOSIS — I2510 Atherosclerotic heart disease of native coronary artery without angina pectoris: Secondary | ICD-10-CM | POA: Diagnosis not present

## 2015-11-30 DIAGNOSIS — I5022 Chronic systolic (congestive) heart failure: Secondary | ICD-10-CM | POA: Diagnosis not present

## 2015-11-30 NOTE — Discharge Instructions (Signed)
° ° °  Supplemental Discharge Instructions for  Pacemaker/Defibrillator Patients  Activity No heavy lifting or vigorous activity with your left/right arm for 6 to 8 weeks.  Do not raise your left/right arm above your head for one week.  Gradually raise your affected arm as drawn below.           10/21                       10/22                         10/23                     10/24 __  NO DRIVING for  1 week    ; you may begin driving on  65/78/46    .  WOUND CARE - Keep the wound area clean and dry.  Do not get this area wet for one week. No showers for one week; you may shower on     . - The tape/steri-strips on your wound will fall off; do not pull them off.  No bandage is needed on the site.  DO  NOT apply any creams, oils, or ointments to the wound area. - If you notice any drainage or discharge from the wound, any swelling or bruising at the site, or you develop a fever > 101? F after you are discharged home, call the office at once.  Special Instructions - You are still able to use cellular telephones; use the ear opposite the side where you have your pacemaker/defibrillator.  Avoid carrying your cellular phone near your device. - When traveling through airports, show security personnel your identification card to avoid being screened in the metal detectors.  Ask the security personnel to use the hand wand. - Avoid arc welding equipment, MRI testing (magnetic resonance imaging), TENS units (transcutaneous nerve stimulators).  Call the office for questions about other devices. - Avoid electrical appliances that are in poor condition or are not properly grounded. - Microwave ovens are safe to be near or to operate.  Additional information for defibrillator patients should your device go off: - If your device goes off ONCE and you feel fine afterward, notify the device clinic nurses. - If your device goes off ONCE and you do not feel well afterward, call 911. - If your device goes off  TWICE, call 911. - If your device goes off THREE times in one day, call 911.  DO NOT DRIVE YOURSELF OR A FAMILY MEMBER WITH A DEFIBRILLATOR TO THE HOSPITAL--CALL 911.

## 2015-11-30 NOTE — Progress Notes (Signed)
Pt & wife given discharge instructions & education. Pt & wife both asked questions & all questions were answered. Pt's IV was removed & CCMD was notified of pt being d/c'd. Hoover Brunette, RN

## 2015-11-30 NOTE — Discharge Summary (Signed)
Discharge Summary    Patient ID: Luis Townsend,  MRN: [id number], DOB/AGE: 80-05-1928 80 y.o.  Admit date: 11/29/2015 Discharge date: 11/30/2015  Primary Care Provider: Dorothyann Peng Cardiologist:  [place] Primary Electrophysiologist:  Constance Haw, MD       Discharge Diagnoses    Principal Problem:   Left ventricular systolic dysfunction with LBBB s/p CRT-P on this admission with a Medtronic model [phone] Percepta Quad CRT-P MRI SureScan (serial Number [id number]).  Active Problems:   Hypertension   Hyperlipidemia   Cerebrovascular disease   COPD (chronic obstructive pulmonary disease), moderat to severe   CAD (coronary artery disease)   CHB (complete heart block) (HCC)   Pacemaker - Medtronic dual chamber Oct 2013, for CHB   Carotid stenosis   Nonischemic cardiomyopathy ([place])   Allergies No Known Allergies   History of Present Illness     Luis Townsend is a 82 y.o. male with a history of systolic heart failure (EF of 30-35%), CHB s/p PPM (pacemaker dependant), CAD s/p CABG, aortic stenosis s/p TAVR, PAD with multiple vessel involvement (carotid stenosis S/P left carotid endarterectomy, S/P left subclavian stent), hyperlipidemia, hypertension and COPD who [facility]15 for CRT upgrade.  Pacemaker interrogation 09/07/15 showed 82.5% atrial pacing and 100% ventricular pacing without evidence of atrial fibrillation or ventricular tachycardia. He is pacemaker dependent. Follow-up echocardiogram performed on 09/13/15 showed a drastic and unexpected reduction left ventricular systolic function, with EF down to 35-40% and diffuse hypokinesis. Mention was made of pronounced dyssynchrony secondary to right ventricular pacing. TAVR function was normal. Mean gradient across the prosthesis was 12 mmHg and there was only trivial perivalvular regurgitation. He had a cath on 10/29/15 which did not explain drop in LV function. Therefore, RV pacing was felt to likely be  the cause of his worsening LV function and plan for CRT-P implantation was discussed with Dr. Curt Bears in the office on 11/14/15.    Hospital Course     Consultants: none  1. Systolic heart failure: Newly found ejection fraction of 35-40%. He had a cath on 10/29/15 which did not explain drop in LV function. He has a LBBB QRS morophology due to RV pacing. LV dysfunction felt to be 2/2 to RV pacing so he was referred for CRT-P placement which was successfully done this admission by Dr. Curt Bears with a Medtronic model Drew (serial Number [id number]).   2. Complete AV block: s/p dual chamber pacemaker. Functioning appropriately last pacemaker check. Now with CRT-P as above  3. Aortic stenosis: s/p TAVR 2013  4. CAD s/p CABG: no angina.   5. Hypertension: Bp well controlled currently    The patient has had an uncomplicated hospital course and is recovering well. He has been seen by Dr. Curt Bears today and deemed ready for discharge home. All follow-up appointments have been scheduled. Discharge medications are listed below.  _____________  Discharge Vitals Blood pressure 125/65, pulse 70, temperature 98.6 F (37 C), temperature source Oral, resp. rate (!) 28, height '5\' 5"'$  (1.651 m), weight 138 lb 9.6 oz (62.9 kg), SpO2 94 %.  Filed Weights   11/29/15 0535 11/30/15 0513  Weight: 140 lb (63.5 kg) 138 lb 9.6 oz (62.9 kg)    Labs & Radiologic Studies      Dg Chest 2 View  Result Date: 11/30/2015 CLINICAL DATA:  Congestive heart failure. EXAM: CHEST  2 VIEW COMPARISON:  Radiographs of October 23, 2015. FINDINGS: Stable cardiomediastinal silhouette.  Atherosclerosis of thoracic aorta is noted. Status post coronary artery bypass graft. Status post aortic valve repair. Left-sided pacemaker is unchanged in position. No pneumothorax or pleural effusion is noted. No acute pulmonary disease is noted. Evidence of old bone infarction involving proximal left  humerus is noted. IMPRESSION: No active cardiopulmonary disease.  Aortic atherosclerosis. Electronically Signed   By: Marijo Conception, M.D.   On: 27-Dec-2015 08:05     Diagnostic Studies/Procedures    Conclusion 27-Dec-2015 SURGEON: Allegra Lai, MD   PREPROCEDURE DIAGNOSES:  1. Nonischemic cardiomyopathy.  2. New York Heart Association class II, heart failure chronically.  3. Left bundle-branch block due to RV pacing  POSTPROCEDURE DIAGNOSES:  1. Nonischemic cardiomyopathy.  2. New York Heart Association class II heart failure chronically.  3. Left bundle-branch block due to RV pacing  PROCEDURES:  1. Left upper extremity venography  2. Biventricular pacemaker upgrade.      _____________    Disposition   Pt is being discharged home today in good condition.  Follow-up Plans & Appointments    Follow-up Information    [facility] .   Why:  The office Hamza Empson call you to schedule a 10 day wound check and follow up with Dr. Curt Bears in 3 months. Please call them if you do not hear from them by next tuesday Contact information: [place] [phone]           Discharge Medications     Medication List    TAKE these medications   albuterol 108 (90 Base) MCG/ACT inhaler Commonly known as:  PROVENTIL HFA;VENTOLIN HFA Inhale 2 puffs into the lungs every 6 (six) hours as needed for wheezing or shortness of breath.   aspirin EC 81 MG tablet Take 81 mg by mouth daily.   carvedilol 6.25 MG tablet Commonly known as:  COREG Take 1 tablet (6.25 mg total) by mouth 2 (two) times daily with a meal.   ramipril 10 MG capsule Commonly known as:  ALTACE TAKE 1 CAPSULE (10 MG TOTAL) BY MOUTH DAILY.   rosuvastatin 40 MG tablet Commonly known as:  CRESTOR Take 1 tablet (40 mg total) by mouth daily.   SYMBICORT 160-4.5 MCG/ACT inhaler Generic drug:   budesonide-formoterol TAKE 2 PUFFS BY MOUTH TWICE A DAY        Outstanding Labs/Studies   Wound check 1 week  Duration of Discharge Encounter   Greater than 30 minutes including physician time.  Signed, Angelena Form PA-C Dec 27, 2015, 9:26 AM  I have seen and examined this patient with Angelena Form.  Agree with above, note added to reflect my findings.  On exam, regular rhythm, no murmurs, lungs clear. Had dual chamber pacemaker upgrade to CRT-P.  No issues post implant with stable LV lead parameters and CXR without issues. Plan for discharge today with follow up in device clinic in 10 days.    Ladaysha Soutar M. Pristine Gladhill MD 12-27-2015 9:52 AM

## 2015-12-05 ENCOUNTER — Encounter: Payer: Self-pay | Admitting: Cardiovascular Disease

## 2015-12-05 ENCOUNTER — Ambulatory Visit (INDEPENDENT_AMBULATORY_CARE_PROVIDER_SITE_OTHER): Payer: Medicare Other | Admitting: Cardiovascular Disease

## 2015-12-05 VITALS — BP 102/55 | HR 73 | Ht 65.0 in | Wt 147.0 lb

## 2015-12-05 DIAGNOSIS — I739 Peripheral vascular disease, unspecified: Secondary | ICD-10-CM

## 2015-12-05 DIAGNOSIS — I495 Sick sinus syndrome: Secondary | ICD-10-CM | POA: Diagnosis not present

## 2015-12-05 DIAGNOSIS — I6523 Occlusion and stenosis of bilateral carotid arteries: Secondary | ICD-10-CM

## 2015-12-05 DIAGNOSIS — I25810 Atherosclerosis of coronary artery bypass graft(s) without angina pectoris: Secondary | ICD-10-CM

## 2015-12-05 DIAGNOSIS — I442 Atrioventricular block, complete: Secondary | ICD-10-CM | POA: Diagnosis not present

## 2015-12-05 DIAGNOSIS — I10 Essential (primary) hypertension: Secondary | ICD-10-CM

## 2015-12-05 DIAGNOSIS — Z952 Presence of prosthetic heart valve: Secondary | ICD-10-CM

## 2015-12-05 DIAGNOSIS — I428 Other cardiomyopathies: Secondary | ICD-10-CM

## 2015-12-05 DIAGNOSIS — E7800 Pure hypercholesterolemia, unspecified: Secondary | ICD-10-CM

## 2015-12-05 DIAGNOSIS — Z950 Presence of cardiac pacemaker: Secondary | ICD-10-CM | POA: Diagnosis not present

## 2015-12-05 NOTE — Patient Instructions (Signed)
Dr Croitoru recommends that you schedule a follow-up appointment in 3 months with a pacemaker check.  If you need a refill on your cardiac medications before your next appointment, please call your pharmacy. 

## 2015-12-05 NOTE — Progress Notes (Signed)
Cardiology Office Note    Date:  12/05/2015   ID:  Luis Townsend, DOB 06/14/1928, MRN [id number]  PCP:  Dorothyann Peng, NP  Cardiologist: Constance Haw, M.D.;  Sanda Klein, MD   Chief Complaint  Patient presents with  . Follow-up    History of Present Illness:  Luis Townsend is a 82 y.o. male sinus bradycardia and complete heart block (pacemaker dependent), CAD S/P CABG, AS S/P TAVR, PAD with multiple vessel involvement (carotid stenosis S/P left carotid endarterectomy, S/P left subclavian stent), hyperlipidemia, hypertension and COPD here for follow-up.  One week ago his pacemaker was upgraded to a CRT-P device. The surgical site has healed very well. There is no evidence of redness, hematoma or drainage. Steri-Strips are still in place.   Device interrogation today shows normal function. Left ventricular lead (Medtronic 4298) pacing threshold is a little high at 2.25 V at 0.1 ms pulse width, but this is virtually identical to the implant findings. He also received a new right ventricular lead (Medtronic [id number]) has excellent pacing thresholds. The old right atrial lead was preserved. At this point the estimated generator longevity is roughly 3.7 years. He has 100% biventricular pacing at this time.  It's a little too soon to tell if upgrade to CRT has had any impact on his dyspnea.  Follow-up echocardiogram performed on August 3 showed a drastic and unexpected reduction left ventricular systolic function, with EF down to 35-40% and diffuse hypokinesis. With pronounced dyssynchrony secondary to right ventricular pacing. TAVR function is normal. Mean gradient across the prosthesis is 12 mmHg and there is only trivial perivalvular regurgitation. On duplex ultrasonography both subclavian arteries are described as "normal". Carotid duplex ultrasounds shows no change in his moderate bilateral carotid artery stenosis (60-69% on R, 40-59% on L). Left vertebral artery flow is described as  "antegrade" although on previous studies this vessel was felt to be occluded. Catheterization performed on 10/29/2015 showed that his native LAD and RCA are chronically occluded and there is 80% stenosis in the left main and 90% stenosis in the proximal left circumflex coronary artery. He also has occlusion of the SVG to the right coronary artery (chronic) and a patent SVG to one of the OM's in the lateral wall, with the others being occluded. The stent in the left subclavian artery was widely patent.  He has coronary disease and underwent bypass surgery 95. In October 2013 he underwent [facility]AVR at [facility] for severe aortic stenosis (26 mm Sapien via direct aortic approach). He underwent bypass surgery 1995 (LIMA to LAD, SVG to PDA, sequential SVG to OM1, OM 2 and OM 3 ). His cardiac catheterization in 2013 showed high-grade stenosis in the distal SVG to PDA and showed that 2 of the 3 oblique marginal arteries were occluded although the bypass itself is widely patent. He had a high-grade stenosis in the left subclavian artery proximal to the vertebral ostium and underwent stenting of this vessel prior to his TAVR.  He also has a history of previous left carotid endarterectomy. He recently underwent carotid ultrasonography, which shows bilateral 50-69% internal carotid artery disease, but showed that the left subclavian stent was still widely patent. 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  Catheterization performed on 10/29/2015 showed that his native LAD and RCA are chronically occluded and there is 80% stenosis in the left main and 90% stenosis in the proximal left circumflex coronary artery. He also has occlusion of the SVG to the  right coronary artery (chronic) and a patent SVG to one of the OM's in the lateral wall, with the others being occluded. The stent in the left subclavian artery was widely patent. On 11/29/2015 his pacemaker was upgraded to a CRT-P Medtronic [employer]  device. He has complete heart block and is pacemaker dependent.  Past Medical History:  Diagnosis Date  . Amaurosis fugax of left eye 10/11/1996  . Aortic stenosis 06/30/2011   severe  . Aortoiliac occlusive disease ([place]) 06/30/2011  . Cardiac resynchronization therapy pacemaker (CRT-P) in place    a. 11/29/15:  Medtronic model [id number] Percepta Quad CRT-P MRI SureScan (serial Number [id number] )  . Cerebrovascular disease 06/30/2011  . CHB (complete heart block) ([place]) 02/03/2013  . COPD (chronic obstructive pulmonary disease), moderat to severe 07/29/2011  . Coronary artery disease    a. 10/29/15 Cath for decreased EF with patent 2/3 grafts, no change   . Hyperlipidemia   . Hypertension   . Myocardial infarction 03/22/1994   acute LAD occlusion  . PAD (peripheral artery disease) ([place])   . RBBB (right bundle branch block)   . S/P CABG x 5 03/28/1994   LIMA to LAD, sequential SVG to OM1-OM2-OM4, SVG to RCA, open vein harvest right leg - Dr Redmond Pulling  . S/P carotid endarterectomy 10/28/1996   Dr Donnetta Hutching  . Subclavian artery stenosis, left ([place]) 06/30/2011  . Tobacco abuse     Past Surgical History:  Procedure Laterality Date  . BILATERAL UPPER EXTREMITY ANGIOGRAM N/A 07/29/2011   Procedure: BILATERAL UPPER EXTREMITY ANGIOGRAM;  Surgeon: Lorretta Harp, MD;  Location: [facility] CATH LAB;  Service: Cardiovascular;  Laterality: N/A;  . CARDIAC CATHETERIZATION  06/24/2011   No intervention - Recommendation-angioplasty/stenting of Lft subclavian artery, high risk replacement of aortic valve w/ biological prosthesis and redo CABG w/ preservation of LIMA bypass and new SVG to PDA and OM, reevaluate carotid arteries and pulmonary function prior to surgery.  Marland Kitchen CARDIAC CATHETERIZATION N/A 10/29/2015   Procedure: Coronary/Graft Angiography;  Surgeon: Lorretta Harp, MD;  Location: [facility] CV LAB;  Service: Cardiovascular;  Laterality: N/A;  . CARDIAC VALVE REPLACEMENT    . CARDIOVASCULAR STRESS TEST  07/10/2009     Evidence of mild ischemia in Basal Anteriolateral and Mid Anterolateral regions-additional diaphragmatic attenuation. No ECG changes. Low risk.\  . CAROTID DOPPLER  10/18/2012   Rt bulb/prox ICA demonstrated 50-69% diameter reduction, Lft mid ICA demonstrated 50-69% diameter reduction, Lft subclavian- = to or <50% sig diameter reduction, Lft vertebral-appeared occluded  . CAROTID ENDARTERECTOMY  10/28/96  DR. TODD EARLY   LEFT AND DACRON PATCH ANGIOPLASTY  . CORONARY ARTERY BYPASS GRAFT  03/28/94 DR. CHARLES WILSON   x5(LIMA to LAD,SVG to OM1-OM2-OM4, SVG to RCA  . EP IMPLANTABLE DEVICE N/A 11/29/2015   Procedure: BiV Upgrade;  Surgeon: Will Meredith Leeds, MD;  Location: [place];  Service: Cardiovascular;  Laterality: N/A;  . EYE SURGERY    . LEFT HEART CATHETERIZATION WITH CORONARY/GRAFT ANGIOGRAM N/A 06/24/2011   Procedure: LEFT HEART CATHETERIZATION WITH Beatrix Fetters;  Surgeon: Sanda Klein, MD;  Location: [place] CATH LAB;  Service: Cardiovascular;  Laterality: N/A;  . PACEMAKER INSERTION  11/12/2011   Medtronic Adapta; model# ADDR01, serial# [id number]  . PERIPHERAL VASCULAR ANGIOPLASTY  07/29/2011   Lft Subclavian 90% stenosis, stented w/ a 7x92m long Cordis Genesis resulting in reduction of 90% stenosos to 0% resdiual.  . TRANSTHORACIC ECHOCARDIOGRAM  08/05/2012   EF 50-55%, septial motion showed abnormal function and dyssynergy,  LA mild-moderately dilated    Current Medications: Outpatient Medications Prior to Visit  Medication Sig Dispense Refill  . albuterol (PROVENTIL HFA;VENTOLIN HFA) 108 (90 BASE) MCG/ACT inhaler Inhale 2 puffs into the lungs every 6 (six) hours as needed for wheezing or shortness of breath. 1 Inhaler 0  . aspirin EC 81 MG tablet Take 81 mg by mouth daily.    . carvedilol (COREG) 6.25 MG tablet Take 1 tablet (6.25 mg total) by mouth 2 (two) times daily with a meal. 60 tablet 3  . ramipril (ALTACE) 10 MG capsule TAKE 1 CAPSULE (10 MG TOTAL) BY  MOUTH DAILY. 30 capsule 10  . rosuvastatin (CRESTOR) 40 MG tablet Take 1 tablet (40 mg total) by mouth daily. 90 tablet 3  . SYMBICORT 160-4.5 MCG/ACT inhaler TAKE 2 PUFFS BY MOUTH TWICE A DAY 1 Inhaler 5   No facility-administered medications prior to visit.      Allergies:   Review of patient's allergies indicates no known allergies.   Social History   Social History  . Marital status: Single    Spouse name: N/A  . Number of children: N/A  . Years of education: N/A   Social History Main Topics  . Smoking status: Former Smoker    Quit date: 03/02/2011  . Smokeless tobacco: Never Used  . Alcohol use No  . Drug use: No  . Sexual activity: Not Asked   Other Topics Concern  . None   Social History Narrative   Married for 63 years   Three children -    1. Son in [place]   2. Daughter in [place]   3.Son in [place]      Two grandchildren            Family History:  The patient's family history includes Diabetes in his maternal grandfather; Heart attack in his father and paternal grandmother; Heart disease in his father; Pneumonia in his maternal grandmother; Stroke in his mother.   ROS:   Please see the history of present illness.    ROS All other systems reviewed and are negative.   PHYSICAL EXAM:   VS:  BP (!) 102/55 (BP Location: Right Arm, Patient Position: Sitting, Cuff Size: Normal)   Pulse 73   Ht '5\' 5"'$  (1.651 m)   Wt 147 lb (66.7 kg)   SpO2 95%   BMI 24.46 kg/m     Rechecked by me: Right arm blood pressure 102/61 mmHg, left arm blood pressure 94/57 mmHg. Bilateral subclavian bruits are present. Grossly equal. GEN: Well nourished, well developed, in no acute distress  HEENT: normal  Neck: no JVD, carotid bruits, or masses Cardiac: Paradoxically split S2 RRR; grade 1-2/6 aortic ejection murmur, no diastolic murmurs, rubs, or gallops,no edema ,  healthy subclavian pacemaker site. Minimal blood pressures in the upper extremities bilaterally Respiratory:  clear to  auscultation bilaterally, normal work of breathing GI: soft, nontender, nondistended, + BS MS: no deformity or atrophy  Skin: warm and dry, no rash Neuro:  Alert and Oriented x 3, Strength and sensation are intact Psych: euthymic mood, full affect  Wt Readings from Last 3 Encounters:  12/05/15 147 lb (66.7 kg)  11/30/15 138 lb 9.6 oz (62.9 kg)  11/14/15 143 lb (64.9 kg)      Studies/Labs Reviewed:   EKG:  EKG is not ordered today.   Recent Labs: 08/28/2015: ALT 20 11/21/2015: Hemoglobin 13.5; Platelets 145 11/26/2015: BUN 22; Creat 1.14; Potassium 4.4; Sodium 142   Lipid Panel  Component Value Date/Time   CHOL 176 08/28/2015 0836   TRIG 58 08/28/2015 0836   HDL 67 08/28/2015 0836   CHOLHDL 2.6 08/28/2015 0836   VLDL 12 08/28/2015 0836   LDLCALC 97 08/28/2015 0836      ASSESSMENT:    1. Nonischemic cardiomyopathy ([place])   2. CHB (complete heart block) (HCC)   3. SSS (sick sinus syndrome) ([place])   4. Status post biventricular pacemaker   5. Coronary artery disease involving coronary bypass graft of native heart without angina pectoris   6. Pure hypercholesterolemia   7. S/P aortic valve replacement   8. Bilateral carotid artery stenosis   9. PAD (peripheral artery disease) ([place])   10. Essential hypertension      PLAN:  In order of problems listed above:  1. CMP: Although he has severe native and graft coronary artery disease, no new obstructive lesions were identified. It appears that LV dysfunction was related to dyssynchronous LV activation by RV pacing and he has undergone upgrade to a CRT device. He is already on maximum tolerated dose of ACE inhibitor and beta blocker. He is not hypervolemic and does not require diuretic therapy at this time. We'll plan to reevaluate LVEF by echo in several months 2. CHB: Pacemaker dependent 3. SSS: Also has frequent need for atrial pacing despite relatively low dose beta blocker 4. Pacemaker - Medtronic dual chamber CRT-P  Oct 2017 100% ventricular pacing for complete heart block. Wound is healing very nicely. LV pacing thresholds are little high which will limit generator longevity, but are clinically acceptable area No meaningful tachyarrhythmias recorded. Recheck in office in 3 months. Remote downloads every 3 months after that and at least yearly office follow-up 5. CAD S/P CABG x 5 1995, occluded SVG-RCA: no angina. Patent SVG to OM and patent left subclavian-LIMA to LAD by recent catheterization. 6. Hyperlipidemia: He is on the maximum dose of the most potent statin we have available. He is lean and quite active. His diet is healthy. His LDL cholesterol is acceptable if not perfect and he also has a pretty good HDL cholesterol and has not had any new coronary or peripheral vascular events in quite a while.  7. Aortic stenosis s/p TAVR 11/2011 Excellent function of the stent valve ([facility], October 2014 with a mean gradient of 8 mm Hg and trivial aortic insufficiency, [facility] July 2017 with mean gradient 12 mm Hg and minimal aortic insufficiency).   8. S/P carotid endarterectomy: Moderate blockage in right ICA unchanged on [place] in July. No recent neurological events. Left vertebral artery appeared to be patent at the time of subclavian angiography.  9. Subclavian artery stenosis, left, pta-stent 07/29/11 patent by angiography September 2017  10. HTN: normal BP    Medication Adjustments/Labs and Tests Ordered: Current medicines are reviewed at length with the patient today.  Concerns regarding medicines are outlined above.  Medication changes, Labs and Tests ordered today are listed in the Patient Instructions below. Patient Instructions  Dr Sallyanne Kuster recommends that you schedule a follow-up appointment in 3 months with a pacemaker check.  If you need a refill on your cardiac medications before your next appointment, please call your pharmacy.    Signed, Sanda Klein, MD  12/05/2015 11:16 AM    [facility] [address], [place], [place]  [address] Phone: [phone]; Fax: [phone]

## 2015-12-10 ENCOUNTER — Telehealth: Payer: Self-pay | Admitting: Adult Health

## 2015-12-10 ENCOUNTER — Telehealth: Payer: Self-pay | Admitting: *Deleted

## 2015-12-10 ENCOUNTER — Ambulatory Visit: Payer: Medicare Other

## 2015-12-10 NOTE — Telephone Encounter (Signed)
Called pt back and let him know that his particular device will not be remote capable until the end of the year. Pt voiced understanding.

## 2015-12-10 NOTE — Telephone Encounter (Signed)
°  1. Has your device fired? no ° °2. Is you device beeping? no ° °3. Are you experiencing draining or swelling at device site? no ° °4. Are you calling to see if we received your device transmission? yes ° °5. Have you passed out? no ° °

## 2015-12-10 NOTE — Telephone Encounter (Signed)
Patient wants to speak to someone about whether he needs another pneumonia shot.

## 2015-12-13 NOTE — Telephone Encounter (Signed)
Patient is due for another Pneumonia shot. Can you please schedule injection visit with me?

## 2015-12-14 ENCOUNTER — Ambulatory Visit (INDEPENDENT_AMBULATORY_CARE_PROVIDER_SITE_OTHER): Payer: Medicare Other

## 2015-12-14 DIAGNOSIS — Z23 Encounter for immunization: Secondary | ICD-10-CM

## 2015-12-20 NOTE — Telephone Encounter (Signed)
Pt has been scheduled.  °

## 2015-12-21 LAB — CUP PACEART INCLINIC DEVICE CHECK
Battery Remaining Longevity: 32 mo
Date Time Interrogation Session: 20171110160152
Implantable Lead Location: 753860
Implantable Lead Model: 5076
MDC IDC LEAD IMPLANT DT: 20131002
MDC IDC LEAD IMPLANT DT: 20131002
MDC IDC LEAD IMPLANT DT: 20171019
MDC IDC LEAD LOCATION: 753859
MDC IDC LEAD LOCATION: 753862
MDC IDC PG IMPLANT DT: 20171019
MDC IDC SET LEADCHNL RA PACING AMPLITUDE: 1.5 V
MDC IDC SET LEADCHNL RV PACING AMPLITUDE: 2 V
MDC IDC SET LEADCHNL RV PACING PULSEWIDTH: 0.4 ms
MDC IDC SET LEADCHNL RV SENSING SENSITIVITY: 2.8 mV

## 2015-12-22 ENCOUNTER — Other Ambulatory Visit: Payer: Self-pay | Admitting: Cardiovascular Disease

## 2016-01-06 ENCOUNTER — Other Ambulatory Visit: Payer: Self-pay | Admitting: Adult Health

## 2016-01-08 NOTE — Telephone Encounter (Signed)
Ok to refill + 3

## 2016-01-10 DIAGNOSIS — H35353 Cystoid macular degeneration, bilateral: Secondary | ICD-10-CM | POA: Diagnosis not present

## 2016-01-10 DIAGNOSIS — H353211 Exudative age-related macular degeneration, right eye, with active choroidal neovascularization: Secondary | ICD-10-CM | POA: Diagnosis not present

## 2016-01-10 DIAGNOSIS — H43822 Vitreomacular adhesion, left eye: Secondary | ICD-10-CM | POA: Diagnosis not present

## 2016-01-10 DIAGNOSIS — H353123 Nonexudative age-related macular degeneration, left eye, advanced atrophic without subfoveal involvement: Secondary | ICD-10-CM | POA: Diagnosis not present

## 2016-01-10 DIAGNOSIS — H353114 Nonexudative age-related macular degeneration, right eye, advanced atrophic with subfoveal involvement: Secondary | ICD-10-CM | POA: Diagnosis not present

## 2016-02-06 NOTE — Progress Notes (Signed)
Patient was given a pneumococcal 23 injection.

## 2016-03-06 ENCOUNTER — Encounter: Payer: Self-pay | Admitting: Cardiovascular Disease

## 2016-03-06 ENCOUNTER — Ambulatory Visit (INDEPENDENT_AMBULATORY_CARE_PROVIDER_SITE_OTHER): Payer: Medicare Other | Admitting: Cardiovascular Disease

## 2016-03-06 VITALS — BP 144/72 | HR 90 | Ht 65.0 in | Wt 144.0 lb

## 2016-03-06 DIAGNOSIS — I442 Atrioventricular block, complete: Secondary | ICD-10-CM | POA: Diagnosis not present

## 2016-03-06 DIAGNOSIS — I6523 Occlusion and stenosis of bilateral carotid arteries: Secondary | ICD-10-CM

## 2016-03-06 DIAGNOSIS — E7800 Pure hypercholesterolemia, unspecified: Secondary | ICD-10-CM

## 2016-03-06 DIAGNOSIS — I771 Stricture of artery: Secondary | ICD-10-CM

## 2016-03-06 DIAGNOSIS — Z950 Presence of cardiac pacemaker: Secondary | ICD-10-CM

## 2016-03-06 DIAGNOSIS — I25810 Atherosclerosis of coronary artery bypass graft(s) without angina pectoris: Secondary | ICD-10-CM

## 2016-03-06 DIAGNOSIS — I10 Essential (primary) hypertension: Secondary | ICD-10-CM

## 2016-03-06 DIAGNOSIS — I5042 Chronic combined systolic (congestive) and diastolic (congestive) heart failure: Secondary | ICD-10-CM | POA: Diagnosis not present

## 2016-03-06 DIAGNOSIS — I495 Sick sinus syndrome: Secondary | ICD-10-CM | POA: Diagnosis not present

## 2016-03-06 DIAGNOSIS — I350 Nonrheumatic aortic (valve) stenosis: Secondary | ICD-10-CM

## 2016-03-06 NOTE — Patient Instructions (Signed)
Medication Instructions: Dr Sallyanne Kuster recommends that you continue on your current medications as directed. Please refer to the Current Medication list given to you today.  Labwork: NONE ORDERED  Testing/Procedures: 1. Echocardiogram - Your physician has requested that you have an echocardiogram. Echocardiography is a painless test that uses sound waves to create images of your heart. It provides your doctor with information about the size and shape of your heart and how well your heart's chambers and valves are working. This procedure takes approximately one hour. There are no restrictions for this procedure. This will be performed at our [facility] location - [address], [address]  2. Remote Device Transmission - Remote monitoring is used to monitor your Pacemaker of ICD from home. This monitoring reduces the number of office visits required to check your device to one time per year. It allows [place] to keep an eye on the functioning of your device to ensure it is working properly. You are scheduled for a device check from home on Thursday, April 26th, 2018. You may send your transmission at any time that day. If you have a wireless device, the transmission will be sent automatically. After your physician reviews your transmission, you will receive a postcard with your next transmission date.  Follow-up: Dr Sallyanne Kuster recommends that you schedule a follow-up appointment in 1 year with a device check. You will receive a reminder letter in the mail two months in advance. If you don't receive a letter, please call our office to schedule the follow-up appointment.  If you need a refill on your cardiac medications before your next appointment, please call your pharmacy.

## 2016-03-06 NOTE — Progress Notes (Signed)
Cardiology Office Note    Date:  03/06/2016   ID:  Luis Townsend, DOB 1928-04-07, MRN [id number]  PCP:  Dorothyann Peng, NP  Cardiologist: Constance Haw, M.D.;  Sanda Klein, MD   Chief Complaint  Patient presents with  . Follow-up    3 months multi ppm  . Shortness of Breath    History of Present Illness:  Luis Townsend is a 83 y.o. male sinus bradycardia and complete heart block (pacemaker dependent), CAD S/P CABG, AS S/P TAVR, PAD with multiple vessel involvement (carotid stenosis S/P left carotid endarterectomy, S/P left subclavian stent), hyperlipidemia, hypertension and COPD here for follow-up.  3 months agoago his pacemaker was upgraded to a CRT-P device. He has noticed some moderate improvement in exercise ability since that time. His device shows that he is active 3-4 hours daily. He is able to complete all his activities without restrictions. He uses an albuterol inhaler a couple of times a week for wheezing. He is taking both carvedilol and Symbicort.  Device interrogation today shows normal function. Left ventricular lead (Medtronic [id number]) pacing threshold is a little high at 1.875 V at 0.1 ms pulse width (LV 4-can vector), actually an improvement from implant findings. He also received a new right ventricular lead (Medtronic [id number]) has excellent pacing thresholds. The old right atrial lead was preserved. At this point the estimated generator longevity is roughly 4.5 years. He has 99.6 % biventricular pacing and 86% atrial pacing. The device has not recorded any atrial or ventricular arrhythmia.  It's a little too soon to tell if upgrade to CRT has had any impact on his dyspnea.  Follow-up echocardiogram performed on August 3 showed a drastic and unexpected reduction left ventricular systolic function, with EF down to 35-40% and diffuse hypokinesis. With pronounced dyssynchrony secondary to right ventricular pacing. TAVR function is normal. Mean gradient across the  prosthesis is 12 mmHg and there is only trivial perivalvular regurgitation. On duplex ultrasonography both subclavian arteries are described as "normal". Carotid duplex ultrasounds shows no change in his moderate bilateral carotid artery stenosis (60-69% on R, 40-59% on L). Left vertebral artery flow is described as "antegrade" although on previous studies this vessel was felt to be occluded. Catheterization performed on 10/29/2015 showed that his native LAD and RCA are chronically occluded and there is 80% stenosis in the left main and 90% stenosis in the proximal left circumflex coronary artery. He also has occlusion of the SVG to the right coronary artery (chronic) and a patent SVG to one of the OM's in the lateral wall, with the others being occluded. The stent in the left subclavian artery was widely patent.  He has coronary disease and underwent bypass surgery 95. In October 2013 he underwent [facility]R at [facility] for severe aortic stenosis (26 mm Sapien via direct aortic approach). He underwent bypass surgery 1995 (LIMA to LAD, SVG to PDA, sequential SVG to OM1, OM 2 and OM 3 ). Catheterization performed on 10/29/2015 showed that his native LAD and RCA are chronically occluded and there is 80% stenosis in the left main and 90% stenosis in the proximal left circumflex coronary artery. He also has occlusion of the SVG to the right coronary artery (chronic) and a patent SVG to one of the OM's in the lateral wall, with the others being occluded. The stent in the left subclavian artery was widely patent. He also has a history of previous left carotid endarterectomy, carotid ultrasonography in July 2017 showed bilateral 50-69% internal  carotid artery disease. 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  On 11/29/2015 his pacemaker was upgraded to a CRT-P Medtronic [employer] device. He has complete heart block and is pacemaker dependent.  Past Medical History:  Diagnosis Date  .  Amaurosis fugax of left eye 10/11/1996  . Aortic stenosis 06/30/2011   severe  . Aortoiliac occlusive disease ([place]) 06/30/2011  . Cardiac resynchronization therapy pacemaker (CRT-P) in place    a. 11/29/15:  Medtronic model [id number] Percepta Quad CRT-P MRI SureScan (serial Number [id number] )  . Cerebrovascular disease 06/30/2011  . CHB (complete heart block) ([place]) 02/03/2013  . COPD (chronic obstructive pulmonary disease), moderat to severe 07/29/2011  . Coronary artery disease    a. 10/29/15 Cath for decreased EF with patent 2/3 grafts, no change   . Hyperlipidemia   . Hypertension   . Myocardial infarction 03/22/1994   acute LAD occlusion  . PAD (peripheral artery disease) ([place])   . RBBB (right bundle branch block)   . S/P CABG x 5 03/28/1994   LIMA to LAD, sequential SVG to OM1-OM2-OM4, SVG to RCA, open vein harvest right leg - Dr Redmond Pulling  . S/P carotid endarterectomy 10/28/1996   Dr Donnetta Hutching  . Subclavian artery stenosis, left ([place]) 06/30/2011  . Tobacco abuse     Past Surgical History:  Procedure Laterality Date  . BILATERAL UPPER EXTREMITY ANGIOGRAM N/A 07/29/2011   Procedure: BILATERAL UPPER EXTREMITY ANGIOGRAM;  Surgeon: Lorretta Harp, MD;  Location: [facility] CATH LAB;  Service: Cardiovascular;  Laterality: N/A;  . CARDIAC CATHETERIZATION  06/24/2011   No intervention - Recommendation-angioplasty/stenting of Lft subclavian artery, high risk replacement of aortic valve w/ biological prosthesis and redo CABG w/ preservation of LIMA bypass and new SVG to PDA and OM, reevaluate carotid arteries and pulmonary function prior to surgery.  Marland Kitchen CARDIAC CATHETERIZATION N/A 10/29/2015   Procedure: Coronary/Graft Angiography;  Surgeon: Lorretta Harp, MD;  Location: [place] CV LAB;  Service: Cardiovascular;  Laterality: N/A;  . CARDIAC VALVE REPLACEMENT    . CARDIOVASCULAR STRESS TEST  07/10/2009   Evidence of mild ischemia in Basal Anteriolateral and Mid Anterolateral regions-additional diaphragmatic  attenuation. No ECG changes. Low risk.\  . CAROTID DOPPLER  10/18/2012   Rt bulb/prox ICA demonstrated 50-69% diameter reduction, Lft mid ICA demonstrated 50-69% diameter reduction, Lft subclavian- = to or <50% sig diameter reduction, Lft vertebral-appeared occluded  . CAROTID ENDARTERECTOMY  10/28/96  DR. TODD EARLY   LEFT AND DACRON PATCH ANGIOPLASTY  . CORONARY ARTERY BYPASS GRAFT  03/28/94 DR. CHARLES WILSON   x5(LIMA to LAD,SVG to OM1-OM2-OM4, SVG to RCA  . EP IMPLANTABLE DEVICE N/A 11/29/2015   Procedure: BiV Upgrade;  Surgeon: Will Meredith Leeds, MD;  Location: [place] CV LAB;  Service: Cardiovascular;  Laterality: N/A;  . EYE SURGERY    . LEFT HEART CATHETERIZATION WITH CORONARY/GRAFT ANGIOGRAM N/A 06/24/2011   Procedure: LEFT HEART CATHETERIZATION WITH Beatrix Fetters;  Surgeon: Sanda Klein, MD;  Location: [facility] CATH LAB;  Service: Cardiovascular;  Laterality: N/A;  . PACEMAKER INSERTION  11/12/2011   Medtronic Adapta; model# ADDR01, serial# [id number]  . PERIPHERAL VASCULAR ANGIOPLASTY  07/29/2011   Lft Subclavian 90% stenosis, stented w/ a 7x39m long Cordis Genesis resulting in reduction of 90% stenosos to 0% resdiual.  . TRANSTHORACIC ECHOCARDIOGRAM  08/05/2012   EF 50-55%, septial motion showed abnormal function and dyssynergy, LA mild-moderately dilated    Current Medications: Outpatient Medications Prior to Visit  Medication Sig Dispense Refill  .  albuterol (PROVENTIL HFA;VENTOLIN HFA) 108 (90 BASE) MCG/ACT inhaler Inhale 2 puffs into the lungs every 6 (six) hours as needed for wheezing or shortness of breath. 1 Inhaler 0  . aspirin EC 81 MG tablet Take 81 mg by mouth daily.    . carvedilol (COREG) 6.25 MG tablet TAKE 1 TABLET (6.25 MG TOTAL) BY MOUTH 2 (TWO) TIMES DAILY WITH A MEAL. 60 tablet 0  . ramipril (ALTACE) 10 MG capsule TAKE 1 CAPSULE (10 MG TOTAL) BY MOUTH DAILY. 30 capsule 10  . rosuvastatin (CRESTOR) 40 MG tablet Take 1 tablet (40 mg total) by mouth  daily. 90 tablet 3  . SYMBICORT 160-4.5 MCG/ACT inhaler TAKE 2 PUFFS BY MOUTH TWICE A DAY 1 Inhaler 5  . VENTOLIN HFA 108 (90 Base) MCG/ACT inhaler TAKE 2 PUFFS BY MOUTH EVERY 4 HOURS AS NEEDED 18 Inhaler 3   No facility-administered medications prior to visit.      Allergies:   Patient has no known allergies.   Social History   Social History  . Marital status: Single    Spouse name: N/A  . Number of children: N/A  . Years of education: N/A   Social History Main Topics  . Smoking status: Former Smoker    Quit date: 03/02/2011  . Smokeless tobacco: Never Used  . Alcohol use No  . Drug use: No  . Sexual activity: Not Asked   Other Topics Concern  . None   Social History Narrative   Married for 63 years   Three children -    1. Son in [place]   2. Daughter in [place]   3.Son in [place]      Two grandchildren            Family History:  The patient's family history includes Diabetes in his maternal grandfather; Heart attack in his father and paternal grandmother; Heart disease in his father; Pneumonia in his maternal grandmother; Stroke in his mother.   ROS:   Please see the history of present illness.    ROS All other systems reviewed and are negative.   PHYSICAL EXAM:   VS:  BP (!) 144/72   Pulse 90   Ht '5\' 5"'$  (1.651 m)   Wt 65.3 kg (144 lb)   BMI 23.96 kg/m      GEN: Well nourished, well developed, in no acute distress  HEENT: normal  Neck: no JVD, carotid bruits, or masses Cardiac: Paradoxically split S2, RRR; grade 1-2/6 aortic ejection murmur, no diastolic murmurs, rubs, or gallops,no edema ,  Healthy left subclavian pacemaker site. Minimal blood pressures in the upper extremities bilaterally. Bilateral subclavian bruits are present. Grossly equal pulses in B radials, normal pedals.Marland Kitchen Respiratory:  clear to auscultation bilaterally, normal work of breathing GI: soft, nontender, nondistended, + BS MS: no deformity or atrophy  Skin: warm and dry, no rash Neuro:   Alert and Oriented x 3, Strength and sensation are intact Psych: euthymic mood, full affect  Wt Readings from Last 3 Encounters:  03/06/16 65.3 kg (144 lb)  12/05/15 66.7 kg (147 lb)  11/30/15 62.9 kg (138 lb 9.6 oz)      Studies/Labs Reviewed:   EKG:  EKG is not ordered today.   Recent Labs: 08/28/2015: ALT 20 11/21/2015: Hemoglobin 13.5; Platelets 145 11/26/2015: BUN 22; Creat 1.14; Potassium 4.4; Sodium 142   Lipid Panel    Component Value Date/Time   CHOL 176 08/28/2015 0836   TRIG 58 08/28/2015 0836   HDL 67 08/28/2015 0836  CHOLHDL 2.6 08/28/2015 0836   VLDL 12 08/28/2015 0836   LDLCALC 97 08/28/2015 0836      ASSESSMENT:    1. Chronic combined systolic and diastolic congestive heart failure ([place])   2. CHB (complete heart block) ([facility])   3. SSS (sick sinus syndrome) ([place])   4. Pacemaker - Medtronic dual chamber Oct 2013, for CHB   5. Coronary artery disease involving coronary bypass graft of native heart without angina pectoris   6. Pure hypercholesterolemia   7. Nonrheumatic aortic valve stenosis   8. Bilateral carotid artery stenosis   9. Subclavian artery stenosis, left, pta and stent 07/29/11   10. Essential hypertension      PLAN:  In order of problems listed above:   1. CHF: Reevaluate left ventricular systolic function 3 months after CRT upgrade. The potential consult between carvedilol and his reactive airway disease is noted, but he has substantial benefit from this medicine and his bronchospasm does not appear to be any worse than baseline. Void increasing the carvedilol dose any further. He appears clinically euvolemic. NYHA functional class I-II 2. CHB: Pacemaker dependent 3. SSS: Also has frequent need for atrial pacing despite relatively low dose beta blocker 4. Pacemaker - Medtronic dual chamber CRT-P Oct 2017 virtually 100% ventricular pacing for complete heart block. Wound is healing very nicely. LV pacing thresholds are little high which will  limit generator longevity, but improved. No meaningful tachyarrhythmias recorded. Remote downloads every 3 months after that and at least yearly office follow-up 5. CAD S/P CABG x 5 1995, occluded SVG-RCA: no angina. Patent SVG to OM and patent left subclavian-LIMA to LAD by recent catheterization. 6. Hyperlipidemia: He is on the maximum dose of the most potent statin we have available. He is lean and quite active. His diet is healthy. His LDL cholesterol is acceptable if not perfect and he also has a pretty good HDL cholesterol and has not had any new coronary or peripheral vascular events in quite a while.  7. Aortic stenosis s/p TAVR 11/2011 Excellent function of the stent valve ([facility], October 2014 with a mean gradient of 8 mm Hg and trivial aortic insufficiency, [facility] July 2017 with mean gradient 12 mm Hg and minimal aortic insufficiency).   8. S/P carotid endarterectomy: Moderate blockage in right ICA unchanged on [place] in July. No recent neurological events. Left vertebral artery appeared to be patent at the time of subclavian angiography.  9. Subclavian artery stenosis, left, pta-stent 07/29/11 patent by angiography September 2017  10. HTN: Blood pressure slightly higher than normal, usually much lower at home. No changes made to medications today.    Medication Adjustments/Labs and Tests Ordered: Current medicines are reviewed at length with the patient today.  Concerns regarding medicines are outlined above.  Medication changes, Labs and Tests ordered today are listed in the Patient Instructions below. Patient Instructions  Medication Instructions: Dr Sallyanne Kuster recommends that you continue on your current medications as directed. Please refer to the Current Medication list given to you today.  Labwork: NONE ORDERED  Testing/Procedures: 1. Echocardiogram - Your physician has requested that you have an echocardiogram. Echocardiography is a painless test that uses sound waves to create images of  your heart. It provides your doctor with information about the size and shape of your heart and how well your heart's chambers and valves are working. This procedure takes approximately one hour. There are no restrictions for this procedure. This will be performed at our [facility] location - [address]  2. Remote Device Transmission - Remote monitoring is used to monitor your Pacemaker of ICD from home. This monitoring reduces the number of office visits required to check your device to one time per year. It allows [place] to keep an eye on the functioning of your device to ensure it is working properly. You are scheduled for a device check from home on Thursday, April 26th, 2018. You may send your transmission at any time that day. If you have a wireless device, the transmission will be sent automatically. After your physician reviews your transmission, you will receive a postcard with your next transmission date.  Follow-up: Dr Sallyanne Kuster recommends that you schedule a follow-up appointment in 1 year with a device check. You will receive a reminder letter in the mail two months in advance. If you don't receive a letter, please call our office to schedule the follow-up appointment.  If you need a refill on your cardiac medications before your next appointment, please call your pharmacy.    Signed, Sanda Klein, MD  03/06/2016 9:55 AM    [facility] [place], [place], [place]  [address] Phone: [phone]; Fax: [phone]

## 2016-03-13 DIAGNOSIS — H353211 Exudative age-related macular degeneration, right eye, with active choroidal neovascularization: Secondary | ICD-10-CM | POA: Diagnosis not present

## 2016-03-20 LAB — CUP PACEART INCLINIC DEVICE CHECK
Battery Remaining Longevity: 55 mo
Brady Statistic AP VP Percent: 86.38 %
Brady Statistic AS VP Percent: 13.24 %
Brady Statistic AS VS Percent: 0.31 %
Brady Statistic RA Percent Paced: 86.32 %
Brady Statistic RV Percent Paced: 99.62 %
Implantable Lead Location: 753860
Implantable Lead Model: 4298
Implantable Lead Model: 5076
Implantable Pulse Generator Implant Date: 20171019
Lead Channel Impedance Value: 1083 Ohm
Lead Channel Impedance Value: 1140 Ohm
Lead Channel Impedance Value: 323 Ohm
Lead Channel Impedance Value: 342 Ohm
Lead Channel Impedance Value: 380 Ohm
Lead Channel Impedance Value: 399 Ohm
Lead Channel Impedance Value: 494 Ohm
Lead Channel Impedance Value: 817 Ohm
Lead Channel Pacing Threshold Amplitude: 1.875 V
Lead Channel Pacing Threshold Pulse Width: 0.4 ms
Lead Channel Sensing Intrinsic Amplitude: 2.625 mV
Lead Channel Sensing Intrinsic Amplitude: 2.625 mV
Lead Channel Setting Pacing Amplitude: 3 V
Lead Channel Setting Pacing Amplitude: 3.5 V
Lead Channel Setting Pacing Pulse Width: 0.4 ms
Lead Channel Setting Pacing Pulse Width: 1 ms
Lead Channel Setting Sensing Sensitivity: 4 mV
MDC IDC LEAD IMPLANT DT: 20131002
MDC IDC LEAD IMPLANT DT: 20131002
MDC IDC LEAD IMPLANT DT: 20171019
MDC IDC LEAD LOCATION: 753859
MDC IDC LEAD LOCATION: 753862
MDC IDC MSMT BATTERY VOLTAGE: 3.03 V
MDC IDC MSMT LEADCHNL LV IMPEDANCE VALUE: 1159 Ohm
MDC IDC MSMT LEADCHNL LV IMPEDANCE VALUE: 437 Ohm
MDC IDC MSMT LEADCHNL LV IMPEDANCE VALUE: 456 Ohm
MDC IDC MSMT LEADCHNL LV IMPEDANCE VALUE: 684 Ohm
MDC IDC MSMT LEADCHNL LV IMPEDANCE VALUE: 722 Ohm
MDC IDC MSMT LEADCHNL LV PACING THRESHOLD PULSEWIDTH: 1 ms
MDC IDC MSMT LEADCHNL RA IMPEDANCE VALUE: 399 Ohm
MDC IDC MSMT LEADCHNL RV PACING THRESHOLD AMPLITUDE: 1.125 V
MDC IDC SESS DTM: 20180125142254
MDC IDC SET LEADCHNL RV PACING AMPLITUDE: 2.25 V
MDC IDC STAT BRADY AP VS PERCENT: 0.06 %

## 2016-03-21 ENCOUNTER — Other Ambulatory Visit: Payer: Self-pay

## 2016-03-21 ENCOUNTER — Ambulatory Visit (HOSPITAL_COMMUNITY): Payer: Medicare Other | Attending: Internal Medicine

## 2016-03-21 DIAGNOSIS — I6529 Occlusion and stenosis of unspecified carotid artery: Secondary | ICD-10-CM | POA: Diagnosis not present

## 2016-03-21 DIAGNOSIS — I422 Other hypertrophic cardiomyopathy: Secondary | ICD-10-CM | POA: Insufficient documentation

## 2016-03-21 DIAGNOSIS — I442 Atrioventricular block, complete: Secondary | ICD-10-CM | POA: Diagnosis not present

## 2016-03-21 DIAGNOSIS — I495 Sick sinus syndrome: Secondary | ICD-10-CM | POA: Diagnosis not present

## 2016-03-21 DIAGNOSIS — I2510 Atherosclerotic heart disease of native coronary artery without angina pectoris: Secondary | ICD-10-CM | POA: Diagnosis not present

## 2016-03-21 DIAGNOSIS — I739 Peripheral vascular disease, unspecified: Secondary | ICD-10-CM | POA: Insufficient documentation

## 2016-03-21 DIAGNOSIS — I5042 Chronic combined systolic (congestive) and diastolic (congestive) heart failure: Secondary | ICD-10-CM | POA: Insufficient documentation

## 2016-03-21 DIAGNOSIS — I509 Heart failure, unspecified: Secondary | ICD-10-CM | POA: Diagnosis present

## 2016-03-26 ENCOUNTER — Encounter: Payer: Self-pay | Admitting: *Deleted

## 2016-04-12 ENCOUNTER — Other Ambulatory Visit: Payer: Self-pay | Admitting: Adult Health

## 2016-04-15 ENCOUNTER — Encounter: Payer: Medicare Other | Admitting: Cardiology

## 2016-04-15 ENCOUNTER — Encounter: Payer: Self-pay | Admitting: Cardiology

## 2016-04-15 ENCOUNTER — Ambulatory Visit (INDEPENDENT_AMBULATORY_CARE_PROVIDER_SITE_OTHER): Payer: Medicare Other | Admitting: Cardiology

## 2016-04-15 VITALS — BP 104/68 | HR 76 | Ht 65.0 in | Wt 148.0 lb

## 2016-04-15 DIAGNOSIS — I428 Other cardiomyopathies: Secondary | ICD-10-CM | POA: Diagnosis not present

## 2016-04-15 DIAGNOSIS — I442 Atrioventricular block, complete: Secondary | ICD-10-CM | POA: Diagnosis not present

## 2016-04-15 DIAGNOSIS — I5022 Chronic systolic (congestive) heart failure: Secondary | ICD-10-CM | POA: Diagnosis not present

## 2016-04-15 DIAGNOSIS — I6523 Occlusion and stenosis of bilateral carotid arteries: Secondary | ICD-10-CM

## 2016-04-15 LAB — CUP PACEART INCLINIC DEVICE CHECK
Brady Statistic AP VS Percent: 0.08 %
Brady Statistic AS VP Percent: 14.11 %
Date Time Interrogation Session: 20180306170725
Implantable Lead Implant Date: 20171019
Implantable Lead Location: 753859
Implantable Lead Location: 753860
Implantable Lead Model: 4298
Implantable Lead Model: 5076
Lead Channel Impedance Value: 323 Ohm
Lead Channel Impedance Value: 361 Ohm
Lead Channel Impedance Value: 380 Ohm
Lead Channel Impedance Value: 380 Ohm
Lead Channel Impedance Value: 665 Ohm
Lead Channel Impedance Value: 931 Ohm
Lead Channel Pacing Threshold Amplitude: 1.125 V
Lead Channel Pacing Threshold Amplitude: 2.25 V
Lead Channel Pacing Threshold Pulse Width: 0.4 ms
Lead Channel Pacing Threshold Pulse Width: 0.4 ms
Lead Channel Pacing Threshold Pulse Width: 1 ms
Lead Channel Setting Pacing Amplitude: 2.5 V
Lead Channel Setting Pacing Amplitude: 3.5 V
Lead Channel Setting Pacing Pulse Width: 0.4 ms
Lead Channel Setting Pacing Pulse Width: 1 ms
MDC IDC LEAD IMPLANT DT: 20131002
MDC IDC LEAD IMPLANT DT: 20131002
MDC IDC LEAD LOCATION: 753858
MDC IDC MSMT BATTERY REMAINING LONGEVITY: 50 mo
MDC IDC MSMT BATTERY VOLTAGE: 2.99 V
MDC IDC MSMT LEADCHNL LV IMPEDANCE VALUE: 456 Ohm
MDC IDC MSMT LEADCHNL LV IMPEDANCE VALUE: 684 Ohm
MDC IDC MSMT LEADCHNL LV IMPEDANCE VALUE: 988 Ohm
MDC IDC MSMT LEADCHNL LV IMPEDANCE VALUE: 988 Ohm
MDC IDC MSMT LEADCHNL RA PACING THRESHOLD AMPLITUDE: 0.5 V
MDC IDC MSMT LEADCHNL RA SENSING INTR AMPL: 1.875 mV
MDC IDC MSMT LEADCHNL RV IMPEDANCE VALUE: 323 Ohm
MDC IDC PG IMPLANT DT: 20171019
MDC IDC SET LEADCHNL RV PACING AMPLITUDE: 2.25 V
MDC IDC SET LEADCHNL RV SENSING SENSITIVITY: 4 mV
MDC IDC STAT BRADY AP VP PERCENT: 85.56 %
MDC IDC STAT BRADY AS VS PERCENT: 0.25 %
MDC IDC STAT BRADY RA PERCENT PACED: 85.47 %
MDC IDC STAT BRADY RV PERCENT PACED: 99.66 %

## 2016-04-15 NOTE — Patient Instructions (Signed)
Medication Instructions:    Your physician recommends that you continue on your current medications as directed. Please refer to the Current Medication list given to you today.  --- If you need a refill on your cardiac medications before your next appointment, please call your pharmacy. ---  Labwork:  None ordered  Testing/Procedures:  None ordered  Follow-Up: Remote monitoring is used to monitor your Pacemaker of ICD from home. This monitoring reduces the number of office visits required to check your device to one time per year. It allows [place] to keep an eye on the functioning of your device to ensure it is working properly. You are scheduled for a device check from home on 07/15/2016. You may send your transmission at any time that day. If you have a wireless device, the transmission will be sent automatically. After your physician reviews your transmission, you will receive a postcard with your next transmission date.   Your physician wants you to follow-up in: 9 months with Dr. Curt Bears.  You will receive a reminder letter in the mail two months in advance. If you don't receive a letter, please call our office to schedule the follow-up appointment.  Thank you for choosing [facility]!!   Trinidad Curet, RN [phone]

## 2016-04-15 NOTE — Progress Notes (Signed)
Electrophysiology Office Note   Date:  04/15/2016   ID:  Luis Townsend, DOB Nov 11, 1928, MRN [id number]  PCP:  Dorothyann Peng, NP  Cardiologist:  [place] Primary Electrophysiologist:  Romona Murdy Meredith Leeds, MD    Chief Complaint  Patient presents with  . Pacemaker Check    Chronic systolic CHF      History of Present Illness: Luis Townsend is a 83 y.o. male who presents today for electrophysiology evaluation.   Hx sinus bradycardia and complete heart block (pacemaker dependent), CAD S/P CABG, AS S/P TAVR, PAD with multiple vessel involvement (carotid stenosis S/P left carotid endarterectomy, S/P left subclavian stent), hyperlipidemia, hypertension and COPD. Pacemaker interrogation July 28, shows 82.5% atrial pacing and 100% ventricular pacing without evidence of atrial fibrillation or ventricular tachycardia. He is pacemaker dependent. Follow-up echocardiogram performed on August 3 showed a drastic and unexpected reduction left ventricular systolic function, with EF down to 35-40%and diffuse hypokinesis. Had upgrade to CRT-P on 11/28/16 with a TTE 03/21/16 showing an EF of 50-55%.   Today, he denies symptoms of palpitations, chest pain,  orthopnea, PND, lower extremity edema, claudication, dizziness, presyncope, syncope, bleeding, or neurologic sequela. The patient is tolerating medications without difficulties and is otherwise without complaint today.   Past Medical History:  Diagnosis Date  . Amaurosis fugax of left eye 10/11/1996  . Aortic stenosis 06/30/2011   severe  . Aortoiliac occlusive disease ([place]) 06/30/2011  . Cardiac resynchronization therapy pacemaker (CRT-P) in place    a. 11/29/15:  Medtronic model [id number] Percepta Quad CRT-P MRI SureScan (serial Number [id number] )  . Cerebrovascular disease 06/30/2011  . CHB (complete heart block) ([place]) 02/03/2013  . COPD (chronic obstructive pulmonary disease), moderat to severe 07/29/2011  . Coronary artery disease    a. 10/29/15 Cath  for decreased EF with patent 2/3 grafts, no change   . Hyperlipidemia   . Hypertension   . Myocardial infarction 03/22/1994   acute LAD occlusion  . PAD (peripheral artery disease) ([place])   . RBBB (right bundle branch block)   . S/P CABG x 5 03/28/1994   LIMA to LAD, sequential SVG to OM1-OM2-OM4, SVG to RCA, open vein harvest right leg - Dr Redmond Pulling  . S/P carotid endarterectomy 10/28/1996   Dr Donnetta Hutching  . Subclavian artery stenosis, left ([place]) 06/30/2011  . Tobacco abuse    Past Surgical History:  Procedure Laterality Date  . BILATERAL UPPER EXTREMITY ANGIOGRAM N/A 07/29/2011   Procedure: BILATERAL UPPER EXTREMITY ANGIOGRAM;  Surgeon: Lorretta Harp, MD;  Location: [facility] CATH LAB;  Service: Cardiovascular;  Laterality: N/A;  . CARDIAC CATHETERIZATION  06/24/2011   No intervention - Recommendation-angioplasty/stenting of Lft subclavian artery, high risk replacement of aortic valve w/ biological prosthesis and redo CABG w/ preservation of LIMA bypass and new SVG to PDA and OM, reevaluate carotid arteries and pulmonary function prior to surgery.  Marland Kitchen CARDIAC CATHETERIZATION N/A 10/29/2015   Procedure: Coronary/Graft Angiography;  Surgeon: Lorretta Harp, MD;  Location: [facility] CV LAB;  Service: Cardiovascular;  Laterality: N/A;  . CARDIAC VALVE REPLACEMENT    . CARDIOVASCULAR STRESS TEST  07/10/2009   Evidence of mild ischemia in Basal Anteriolateral and Mid Anterolateral regions-additional diaphragmatic attenuation. No ECG changes. Low risk.\  . CAROTID DOPPLER  10/18/2012   Rt bulb/prox ICA demonstrated 50-69% diameter reduction, Lft mid ICA demonstrated 50-69% diameter reduction, Lft subclavian- = to or <50% sig diameter reduction, Lft vertebral-appeared occluded  . CAROTID ENDARTERECTOMY  10/28/96  DR. TODD EARLY  LEFT AND DACRON PATCH ANGIOPLASTY  . CORONARY ARTERY BYPASS GRAFT  03/28/94 DR. CHARLES WILSON   x5(LIMA to LAD,SVG to OM1-OM2-OM4, SVG to RCA  . EP IMPLANTABLE DEVICE N/A 11/29/2015    Procedure: BiV Upgrade;  Surgeon: Graci Hulce Meredith Leeds, MD;  Location: [facility] CV LAB;  Service: Cardiovascular;  Laterality: N/A;  . EYE SURGERY    . LEFT HEART CATHETERIZATION WITH CORONARY/GRAFT ANGIOGRAM N/A 06/24/2011   Procedure: LEFT HEART CATHETERIZATION WITH Beatrix Fetters;  Surgeon: Sanda Klein, MD;  Location: [facility] CATH LAB;  Service: Cardiovascular;  Laterality: N/A;  . PACEMAKER INSERTION  11/12/2011   Medtronic Adapta; model# ADDR01, serial# [id number]  . PERIPHERAL VASCULAR ANGIOPLASTY  07/29/2011   Lft Subclavian 90% stenosis, stented w/ a 7x67m long Cordis Genesis resulting in reduction of 90% stenosos to 0% resdiual.  . TRANSTHORACIC ECHOCARDIOGRAM  08/05/2012   EF 50-55%, septial motion showed abnormal function and dyssynergy, LA mild-moderately dilated     Current Outpatient Prescriptions  Medication Sig Dispense Refill  . albuterol (PROVENTIL HFA;VENTOLIN HFA) 108 (90 BASE) MCG/ACT inhaler Inhale 2 puffs into the lungs every 6 (six) hours as needed for wheezing or shortness of breath. 1 Inhaler 0  . aspirin EC 81 MG tablet Take 81 mg by mouth daily.    . carvedilol (COREG) 6.25 MG tablet TAKE 1 TABLET (6.25 MG TOTAL) BY MOUTH 2 (TWO) TIMES DAILY WITH A MEAL. 60 tablet 0  . furosemide (LASIX) 20 MG tablet Take 20 mg by mouth daily.    . ramipril (ALTACE) 10 MG capsule TAKE 1 CAPSULE (10 MG TOTAL) BY MOUTH DAILY. 30 capsule 10  . rosuvastatin (CRESTOR) 40 MG tablet Take 1 tablet (40 mg total) by mouth daily. 90 tablet 3  . SYMBICORT 160-4.5 MCG/ACT inhaler USE 2 PUFFS TWICE A DAY 10.2 Inhaler 1  . VENTOLIN HFA 108 (90 Base) MCG/ACT inhaler TAKE 2 PUFFS BY MOUTH EVERY 4 HOURS AS NEEDED 18 Inhaler 3   No current facility-administered medications for this visit.     Allergies:   Patient has no known allergies.   Social History:  The patient  reports that he quit smoking about 5 years ago. He has never used smokeless tobacco. He reports that he does not drink  alcohol or use drugs.   Family History:  The patient's family history includes Diabetes in his maternal grandfather; Heart attack in his father and paternal grandmother; Heart disease in his father; Pneumonia in his maternal grandmother; Stroke in his mother.    ROS:  Please see the history of present illness.   Otherwise, review of systems is positive for DOE.   All other systems are reviewed and negative.    PHYSICAL EXAM: VS:  BP 104/68   Pulse 76   Ht '5\' 5"'$  (1.651 m)   Wt 148 lb (67.1 kg)   BMI 24.63 kg/m  , BMI Body mass index is 24.63 kg/m. GEN: Well nourished, well developed, in no acute distress  HEENT: normal  Neck: no JVD, carotid bruits, or masses Cardiac: RRR; no murmurs, rubs, or gallops,no edema  Respiratory:  clear to auscultation bilaterally, normal work of breathing GI: soft, nontender, nondistended, + BS MS: no deformity or atrophy  Skin: warm and dry,  device pocket is well healed Neuro:  Strength and sensation are intact Psych: euthymic mood, full affect  EKG:  EKG is ordered today. Personal review of the ekg ordered 12/04/16 shows AV paced  Recent Labs: 08/28/2015: ALT 20 11/21/2015: Hemoglobin 13.5; Platelets  145 11/26/2015: BUN 22; Creat 1.14; Potassium 4.4; Sodium 142    Lipid Panel     Component Value Date/Time   CHOL 176 08/28/2015 0836   TRIG 58 08/28/2015 0836   HDL 67 08/28/2015 0836   CHOLHDL 2.6 08/28/2015 0836   VLDL 12 08/28/2015 0836   LDLCALC 97 08/28/2015 0836     Wt Readings from Last 3 Encounters:  04/15/16 148 lb (67.1 kg)  03/06/16 144 lb (65.3 kg)  12/05/15 147 lb (66.7 kg)      Other studies Reviewed: Additional studies/ records that were reviewed today include: TTE 09/13/15, Coronary angiography 10/29/15  Review of the above records today demonstrates:  - Left ventricle: The cavity size was normal. Wall thickness was   increased in a pattern of mild LVH. Septal-lateral dyssynchrony   noted. Indeterminant diastolic  function. Systolic function was   moderately reduced. The estimated ejection fraction was in the   range of 35% to 40%. Diffuse hypokinesis. - Aortic valve: Bioprosthetic aortic valve s/p TAVR. No significant   stenosis. Trivial peri-valvular regurgitation. Mean gradient (S):   12 mm Hg. - Mitral valve: There was trivial regurgitation. - Right ventricle: The cavity size was normal. Pacer wire or   catheter noted in right ventricle. Systolic function was mildly   reduced. - Pulmonary arteries: No complete TR doppler jet so unable to   estimate PA systolic pressure. - Inferior vena cava: The vessel was normal in size. The   respirophasic diameter changes were in the normal range (= 50%),   consistent with normal central venous pressure.   Prox RCA to Mid RCA lesion, 100 %stenosed.  Ost LAD to Prox LAD lesion, 100 %stenosed.  Ost LM lesion, 80 %stenosed.  Ost Cx to Prox Cx lesion, 90 %stenosed.  Origin to Prox Graft lesion, 100 %stenosed.   ASSESSMENT AND PLAN:  1.  Complete AV block: s/p dual chamber pacemaker. Had upgrade to CRT-P on 11/29/15. Device functioning appropriately. No changes made today.  2. Systolc heart failure: EF reduced likely due to RV pacing. Had upgrade to CRT-P. EF has since improved to 50-55%. Some to weigh himself daily, and a taken a dose of Lasix if his weight changes by 1-2 pounds per day.  3. Aortic stenosis: s/p TAVR 2013  4. CAD s/p CABG: no angina  5. Hypertension: 110/60 on recheck.  No changes made today.  Current medicines are reviewed at length with the patient today.   The patient has concerns regarding his medicines.  The following changes were made today:  none  Labs/ tests ordered today include:  Orders Placed This Encounter  Procedures  . EKG 12-Lead     Disposition:   FU with Lisbet Busker  3 months  Signed, Cindel Daugherty Meredith Leeds, MD  04/15/2016 4:01 PM     [address] [place]  [address] [phone] (office) [phone] (fax)

## 2016-04-25 ENCOUNTER — Encounter (HOSPITAL_COMMUNITY): Payer: Self-pay | Admitting: Emergency Medicine

## 2016-04-25 ENCOUNTER — Telehealth: Payer: Self-pay

## 2016-04-25 ENCOUNTER — Ambulatory Visit (HOSPITAL_COMMUNITY)
Admission: EM | Admit: 2016-04-25 | Discharge: 2016-04-25 | Disposition: A | Discharge status: Home / Self Care | Payer: Medicare Other | Attending: Internal Medicine | Admitting: Internal Medicine

## 2016-04-25 DIAGNOSIS — R0602 Shortness of breath: Secondary | ICD-10-CM | POA: Diagnosis not present

## 2016-04-25 DIAGNOSIS — J441 Chronic obstructive pulmonary disease with (acute) exacerbation: Secondary | ICD-10-CM

## 2016-04-25 MED ORDER — IPRATROPIUM-ALBUTEROL 0.5-2.5 (3) MG/3ML IN SOLN
3.0000 mL | Freq: Once | RESPIRATORY_TRACT | Status: AC
  Administered 2016-04-25: 3 mL via RESPIRATORY_TRACT

## 2016-04-25 MED ORDER — PREDNISONE 10 MG PO TABS: ORAL_TABLET | ORAL | 0 refills | Status: DC

## 2016-04-25 MED ORDER — IPRATROPIUM-ALBUTEROL 0.5-2.5 (3) MG/3ML IN SOLN
RESPIRATORY_TRACT | Status: AC
  Filled 2016-04-25: qty 3

## 2016-04-25 MED ORDER — LEVOFLOXACIN 500 MG PO TABS: 500.0000 mg | ORAL_TABLET | Freq: Every day | ORAL | 0 refills | Status: DC

## 2016-04-25 NOTE — Telephone Encounter (Signed)
Patient called to report he has had increase in cough and ShOB over the past few days. He states that he has been using his inhalers and they are not helping. He wants to see Dorothyann Peng, NP. Advised pt that [name] does not have any openings left for today. Pt sounded ShOB in phone during conversation, therefore pt was advised to seek UC for evaluation and possible treatment. Explained location of [place] to pt and he states that he will go there this afternoon. Nothing further needed at this time.

## 2016-04-25 NOTE — ED Triage Notes (Signed)
Pt c/o cold sx onset: 1 weeks  Sx include: fevers, prod cough, SOB, dyspnea  Taking: OTC cold meds w/temp relief... Using rescue inhalers w/no relief.   Hx of MI, CAD... Smoke for 20-30 years.   A&O x4... NAD.Marland Kitchen Pt panting while breathing.

## 2016-04-25 NOTE — Discharge Instructions (Signed)
Continue using albuterol inhaler 2 puffs every 6 hours prn for shortness of breath.

## 2016-04-25 NOTE — ED Provider Notes (Signed)
CSN: [id number]     Arrival date & time 04/25/16  1437 History   None    Chief Complaint  Patient presents with  . URI   (Consider location/radiation/quality/duration/timing/severity/associated sxs/prior Treatment) Patient c/o SOB.  He has hx of copd.  He uses albuterol MDI and it isn't helping.   The history is provided by the patient.  URI  Presenting symptoms: congestion and cough   Severity:  Moderate Duration:  1 day Timing:  Constant Progression:  Worsening Chronicity:  New Relieved by:  Nothing Worsened by:  Nothing Ineffective treatments:  None tried   Past Medical History:  Diagnosis Date  . Amaurosis fugax of left eye 10/11/1996  . Aortic stenosis 06/30/2011   severe  . Aortoiliac occlusive disease ([place]) 06/30/2011  . Cardiac resynchronization therapy pacemaker (CRT-P) in place    a. 11/29/15:  Medtronic model G2IR48 Percepta Quad CRT-P MRI SureScan (serial Number [id number] )  . Cerebrovascular disease 06/30/2011  . CHB (complete heart block) ([place]) 02/03/2013  . COPD (chronic obstructive pulmonary disease), moderat to severe 07/29/2011  . Coronary artery disease    a. 10/29/15 Cath for decreased EF with patent 2/3 grafts, no change   . Hyperlipidemia   . Hypertension   . Myocardial infarction 03/22/1994   acute LAD occlusion  . PAD (peripheral artery disease) ([place])   . RBBB (right bundle branch block)   . S/P CABG x 5 03/28/1994   LIMA to LAD, sequential SVG to OM1-OM2-OM4, SVG to RCA, open vein harvest right leg - Dr Redmond Pulling  . S/P carotid endarterectomy 10/28/1996   Dr Donnetta Hutching  . Subclavian artery stenosis, left ([place]) 06/30/2011  . Tobacco abuse    Past Surgical History:  Procedure Laterality Date  . BILATERAL UPPER EXTREMITY ANGIOGRAM N/A 07/29/2011   Procedure: BILATERAL UPPER EXTREMITY ANGIOGRAM;  Surgeon: Lorretta Harp, MD;  Location: [facility] CATH LAB;  Service: Cardiovascular;  Laterality: N/A;  . CARDIAC CATHETERIZATION  06/24/2011   No intervention -  Recommendation-angioplasty/stenting of Lft subclavian artery, high risk replacement of aortic valve w/ biological prosthesis and redo CABG w/ preservation of LIMA bypass and new SVG to PDA and OM, reevaluate carotid arteries and pulmonary function prior to surgery.  Marland Kitchen CARDIAC CATHETERIZATION N/A 10/29/2015   Procedure: Coronary/Graft Angiography;  Surgeon: Lorretta Harp, MD;  Location: [facility] CV LAB;  Service: Cardiovascular;  Laterality: N/A;  . CARDIAC VALVE REPLACEMENT    . CARDIOVASCULAR STRESS TEST  07/10/2009   Evidence of mild ischemia in Basal Anteriolateral and Mid Anterolateral regions-additional diaphragmatic attenuation. No ECG changes. Low risk.\  . CAROTID DOPPLER  10/18/2012   Rt bulb/prox ICA demonstrated 50-69% diameter reduction, Lft mid ICA demonstrated 50-69% diameter reduction, Lft subclavian- = to or <50% sig diameter reduction, Lft vertebral-appeared occluded  . CAROTID ENDARTERECTOMY  10/28/96  DR. TODD EARLY   LEFT AND DACRON PATCH ANGIOPLASTY  . CORONARY ARTERY BYPASS GRAFT  03/28/94 DR. CHARLES WILSON   x5(LIMA to LAD,SVG to OM1-OM2-OM4, SVG to RCA  . EP IMPLANTABLE DEVICE N/A 11/29/2015   Procedure: BiV Upgrade;  Surgeon: Will Meredith Leeds, MD;  Location: [facility] CV LAB;  Service: Cardiovascular;  Laterality: N/A;  . EYE SURGERY    . LEFT HEART CATHETERIZATION WITH CORONARY/GRAFT ANGIOGRAM N/A 06/24/2011   Procedure: LEFT HEART CATHETERIZATION WITH Beatrix Fetters;  Surgeon: Sanda Klein, MD;  Location: [facility] CATH LAB;  Service: Cardiovascular;  Laterality: N/A;  . PACEMAKER INSERTION  11/12/2011   Medtronic Adapta; model# ADDR01, serial# [id number]  .  PERIPHERAL VASCULAR ANGIOPLASTY  07/29/2011   Lft Subclavian 90% stenosis, stented w/ a 7x30m long Cordis Genesis resulting in reduction of 90% stenosos to 0% resdiual.  . TRANSTHORACIC ECHOCARDIOGRAM  08/05/2012   EF 50-55%, septial motion showed abnormal function and dyssynergy, LA mild-moderately  dilated   Family History  Problem Relation Age of Onset  . Stroke Mother   . Heart attack Father   . Heart disease Father   . Pneumonia Maternal Grandmother   . Diabetes Maternal Grandfather   . Heart attack Paternal Grandmother    Social History  Substance Use Topics  . Smoking status: Former Smoker    Quit date: 03/02/2011  . Smokeless tobacco: Never Used  . Alcohol use No    Review of Systems  Constitutional: Negative.   HENT: Positive for congestion.   Eyes: Negative.   Respiratory: Positive for cough.   Cardiovascular: Negative.   Gastrointestinal: Negative.   Endocrine: Negative.   Genitourinary: Negative.   Musculoskeletal: Negative.   Allergic/Immunologic: Negative.   Neurological: Negative.   Hematological: Negative.   Psychiatric/Behavioral: Negative.     Allergies  Patient has no known allergies.  Home Medications   Prior to Admission medications   Medication Sig Start Date End Date Taking? Authorizing Provider  albuterol (PROVENTIL HFA;VENTOLIN HFA) 108 (90 BASE) MCG/ACT inhaler Inhale 2 puffs into the lungs every 6 (six) hours as needed for wheezing or shortness of breath. 01/13/14  Yes LIsaiah Serge NP  aspirin EC 81 MG tablet Take 81 mg by mouth daily.   Yes Historical Provider, MD  carvedilol (COREG) 6.25 MG tablet TAKE 1 TABLET (6.25 MG TOTAL) BY MOUTH 2 (TWO) TIMES DAILY WITH A MEAL. 12/24/15  Yes Mihai Croitoru, MD  furosemide (LASIX) 20 MG tablet Take 20 mg by mouth daily.   Yes Historical Provider, MD  ramipril (ALTACE) 10 MG capsule TAKE 1 CAPSULE (10 MG TOTAL) BY MOUTH DAILY. 06/01/14  Yes Mihai Croitoru, MD  rosuvastatin (CRESTOR) 40 MG tablet Take 1 tablet (40 mg total) by mouth daily. 11/27/15  Yes MSanda Klein MD  SYMBICORT 160-4.5 MCG/ACT inhaler USE 2 PUFFS TWICE A DAY 04/14/16  Yes CDorothyann Peng NP  VENTOLIN HFA 108 (90 Base) MCG/ACT inhaler TAKE 2 PUFFS BY MOUTH EVERY 4 HOURS AS NEEDED 01/08/16  Yes CDorothyann Peng NP  levofloxacin  (LEVAQUIN) 500 MG tablet Take 1 tablet (500 mg total) by mouth daily. 04/25/16   WLysbeth Penner FNP  predniSONE (DELTASONE) 10 MG tablet Take 4 po qd x2d then 3 po qd x2d then 2po qd x2d then 1 po qd x 2d then stop 04/25/16   WLysbeth Penner FNP   Meds Ordered and Administered this Visit   Medications  ipratropium-albuterol (DUONEB) 0.5-2.5 (3) MG/3ML nebulizer solution 3 mL (3 mLs Nebulization Given 04/25/16 1537)    BP (!) 142/53 (BP Location: Right Arm)   Pulse 75   Temp 97.9 F (36.6 C) (Oral)   Resp (!) 24   SpO2 97%  No data found.   Physical Exam  Constitutional: He is oriented to person, place, and time. He appears well-developed and well-nourished.  HENT:  Head: Normocephalic.  Right Ear: External ear normal.  Left Ear: External ear normal.  Mouth/Throat: Oropharynx is clear and moist.  Eyes: Conjunctivae and EOM are normal. Pupils are equal, round, and reactive to light.  Neck: Normal range of motion. Neck supple.  Cardiovascular: Normal rate, regular rhythm and normal heart sounds.   Pulmonary/Chest: Effort normal.  BBS diminished throughout  Neurological: He is alert and oriented to person, place, and time.  Nursing note and vitals reviewed.   Urgent Care Course     Procedures (including critical care time)  Labs Review Labs Reviewed - No data to display  Imaging Review No results found.   Visual Acuity Review  Right Eye Distance:   Left Eye Distance:   Bilateral Distance:    Right Eye Near:   Left Eye Near:    Bilateral Near:         MDM   1. COPD exacerbation (HCC)    Duoneb - Good response  Levaquin '500mg'$  one po qd x 7 days #7 Prednisone '10mg'$  4x2 3x2 2x2 1x2 #20  Push po fluids, rest, tylenol and motrin otc prn as directed for fever, arthralgias, and myalgias.  Follow up prn if sx's continue or persist.    Lysbeth Penner, FNP 04/25/16 1600

## 2016-04-29 ENCOUNTER — Encounter: Payer: Self-pay | Admitting: Adult Health

## 2016-04-29 ENCOUNTER — Ambulatory Visit (INDEPENDENT_AMBULATORY_CARE_PROVIDER_SITE_OTHER): Payer: Medicare Other | Admitting: Adult Health

## 2016-04-29 VITALS — BP 140/86 | HR 81 | Temp 97.5°F | Ht 65.0 in | Wt 147.0 lb

## 2016-04-29 DIAGNOSIS — I6523 Occlusion and stenosis of bilateral carotid arteries: Secondary | ICD-10-CM

## 2016-04-29 DIAGNOSIS — J449 Chronic obstructive pulmonary disease, unspecified: Secondary | ICD-10-CM

## 2016-04-29 MED ORDER — IPRATROPIUM-ALBUTEROL 0.5-2.5 (3) MG/3ML IN SOLN
3.0000 mL | Freq: Once | RESPIRATORY_TRACT | Status: AC
  Administered 2016-04-29: 3 mL via RESPIRATORY_TRACT

## 2016-04-29 MED ORDER — PREDNISONE 10 MG PO TABS: 10.0000 mg | ORAL_TABLET | Freq: Every day | ORAL | 0 refills | Status: DC

## 2016-04-29 NOTE — Patient Instructions (Addendum)
I have extended your prednisone therapy for 5 days.   Someone from Pulmonary will call you to schedule your appointment  Follow up as needed

## 2016-04-29 NOTE — Progress Notes (Signed)
Subjective:    Patient ID: Luis Townsend, male    DOB: [birth year], 83 y.o.   MRN: [id number]  HPI  83 year old male who presents to the office today for follow up after being seen at [facility] for SOB on 04/25/2016. He was diagnosed with a COPD exacerbation. He was prescribed a 7 day course of Levaquin and 8 day course of Prednisone. He was also given a DuoNeb at [facility] and responded well to this.   He reports that he is feeling better but continues to feel SOB.   He reports that he feels as though his COPD is becoming worse. He is using his inhalers as needed    Review of Systems See HPI   Past Medical History:  Diagnosis Date  . Amaurosis fugax of left eye 10/11/1996  . Aortic stenosis 06/30/2011   severe  . Aortoiliac occlusive disease ([place]) 06/30/2011  . Cardiac resynchronization therapy pacemaker (CRT-P) in place    a. 11/29/15:  Medtronic model X3AT55 Percepta Quad CRT-P MRI SureScan (serial Number [id number] )  . Cerebrovascular disease 06/30/2011  . CHB (complete heart block) ([place]) 02/03/2013  . COPD (chronic obstructive pulmonary disease), moderat to severe 07/29/2011  . Coronary artery disease    a. 10/29/15 Cath for decreased EF with patent 2/3 grafts, no change   . Hyperlipidemia   . Hypertension   . Myocardial infarction 03/22/1994   acute LAD occlusion  . PAD (peripheral artery disease) ([place])   . RBBB (right bundle branch block)   . S/P CABG x 5 03/28/1994   LIMA to LAD, sequential SVG to OM1-OM2-OM4, SVG to RCA, open vein harvest right leg - Dr [facility] Pulling  . S/P carotid endarterectomy 10/28/1996   Dr Donnetta Hutching  . Subclavian artery stenosis, left ([place]) 06/30/2011  . Tobacco abuse     Social History   Social History  . Marital status: Single    Spouse name: N/A  . Number of children: N/A  . Years of education: N/A   Occupational History  . Not on file.   Social History Main Topics  . Smoking status: Former Smoker    Quit date: 03/02/2011  . Smokeless tobacco: Never Used  .  Alcohol use No  . Drug use: No  . Sexual activity: Not on file   Other Topics Concern  . Not on file   Social History Narrative   Married for 63 years   Three children -    1. Son in [place]   2. Daughter in [place]   3.Son in [place]      Two grandchildren           Past Surgical History:  Procedure Laterality Date  . BILATERAL UPPER EXTREMITY ANGIOGRAM N/A 07/29/2011   Procedure: BILATERAL UPPER EXTREMITY ANGIOGRAM;  Surgeon: Lorretta Harp, MD;  Location: [facility] CATH LAB;  Service: Cardiovascular;  Laterality: N/A;  . CARDIAC CATHETERIZATION  06/24/2011   No intervention - Recommendation-angioplasty/stenting of Lft subclavian artery, high risk replacement of aortic valve w/ biological prosthesis and redo CABG w/ preservation of LIMA bypass and new SVG to PDA and OM, reevaluate carotid arteries and pulmonary function prior to surgery.  Marland Kitchen CARDIAC CATHETERIZATION N/A 10/29/2015   Procedure: Coronary/Graft Angiography;  Surgeon: Lorretta Harp, MD;  Location: [place] CV LAB;  Service: Cardiovascular;  Laterality: N/A;  . CARDIAC VALVE REPLACEMENT    . CARDIOVASCULAR STRESS TEST  07/10/2009   Evidence of mild ischemia in Basal Anteriolateral and Mid Anterolateral regions-additional  diaphragmatic attenuation. No ECG changes. Low risk.\  . CAROTID DOPPLER  10/18/2012   Rt bulb/prox ICA demonstrated 50-69% diameter reduction, Lft mid ICA demonstrated 50-69% diameter reduction, Lft subclavian- = to or <50% sig diameter reduction, Lft vertebral-appeared occluded  . CAROTID ENDARTERECTOMY  10/28/96  DR. TODD EARLY   LEFT AND DACRON PATCH ANGIOPLASTY  . CORONARY ARTERY BYPASS GRAFT  03/28/94 DR. CHARLES WILSON   x5(LIMA to LAD,SVG to OM1-OM2-OM4, SVG to RCA  . EP IMPLANTABLE DEVICE N/A 11/29/2015   Procedure: BiV Upgrade;  Surgeon: Will Meredith Leeds, MD;  Location: [place] CV LAB;  Service: Cardiovascular;  Laterality: N/A;  . EYE SURGERY    . LEFT HEART CATHETERIZATION WITH CORONARY/GRAFT  ANGIOGRAM N/A 06/24/2011   Procedure: LEFT HEART CATHETERIZATION WITH Beatrix Fetters;  Surgeon: Sanda Klein, MD;  Location: [facility] CATH LAB;  Service: Cardiovascular;  Laterality: N/A;  . PACEMAKER INSERTION  11/12/2011   Medtronic Adapta; model# ADDR01, serial# [id number]  . PERIPHERAL VASCULAR ANGIOPLASTY  07/29/2011   Lft Subclavian 90% stenosis, stented w/ a 7x74m long Cordis [place] resulting in reduction of 90% stenosos to 0% resdiual.  . TRANSTHORACIC ECHOCARDIOGRAM  08/05/2012   EF 50-55%, septial motion showed abnormal function and dyssynergy, LA mild-moderately dilated    Family History  Problem Relation Age of Onset  . Stroke Mother   . Heart attack Father   . Heart disease Father   . Pneumonia Maternal Grandmother   . Diabetes Maternal Grandfather   . Heart attack Paternal Grandmother     No Known Allergies  Current Outpatient Prescriptions on File Prior to Visit  Medication Sig Dispense Refill  . albuterol (PROVENTIL HFA;VENTOLIN HFA) 108 (90 BASE) MCG/ACT inhaler Inhale 2 puffs into the lungs every 6 (six) hours as needed for wheezing or shortness of breath. 1 Inhaler 0  . aspirin EC 81 MG tablet Take 81 mg by mouth daily.    . carvedilol (COREG) 6.25 MG tablet TAKE 1 TABLET (6.25 MG TOTAL) BY MOUTH 2 (TWO) TIMES DAILY WITH A MEAL. 60 tablet 0  . furosemide (LASIX) 20 MG tablet Take 20 mg by mouth daily.    .Marland Kitchenlevofloxacin (LEVAQUIN) 500 MG tablet Take 1 tablet (500 mg total) by mouth daily. 7 tablet 0  . predniSONE (DELTASONE) 10 MG tablet Take 4 po qd x2d then 3 po qd x2d then 2po qd x2d then 1 po qd x 2d then stop 20 tablet 0  . ramipril (ALTACE) 10 MG capsule TAKE 1 CAPSULE (10 MG TOTAL) BY MOUTH DAILY. 30 capsule 10  . rosuvastatin (CRESTOR) 40 MG tablet Take 1 tablet (40 mg total) by mouth daily. 90 tablet 3  . SYMBICORT 160-4.5 MCG/ACT inhaler USE 2 PUFFS TWICE A DAY 10.2 Inhaler 1  . VENTOLIN HFA 108 (90 Base) MCG/ACT inhaler TAKE 2 PUFFS BY MOUTH EVERY 4  HOURS AS NEEDED 18 Inhaler 3   No current facility-administered medications on file prior to visit.     BP (!) 170/90 (BP Location: Left Arm, Patient Position: Sitting, Cuff Size: Normal)   Pulse 81   Temp 97.5 F (36.4 C) (Oral)   Ht '5\' 5"'$  (1.651 m)   Wt 147 lb (66.7 kg)   SpO2 95%   BMI 24.46 kg/m        Objective:   Physical Exam  Constitutional: He is oriented to person, place, and time. He appears well-developed and well-nourished. No distress.  Cardiovascular: Normal rate, regular rhythm, normal heart sounds and intact distal pulses.  Exam reveals no gallop and no friction rub.   No murmur heard. Pulmonary/Chest: Effort normal. No respiratory distress. He has wheezes (expiratory wheezes full fields on right ). He has no rales. He exhibits no tenderness.  Neurological: He is alert and oriented to person, place, and time.  Skin: Skin is warm and dry. No rash noted. He is not diaphoretic. No erythema. No pallor.  Psychiatric: He has a normal mood and affect. His behavior is normal. Judgment and thought content normal.  Nursing note and vitals reviewed.     Assessment & Plan:  1. Chronic obstructive pulmonary disease, unspecified COPD type ([place]) - Finish up antibiotics. I will add 10 mg of prednisone x 5 additional days  - Ambulatory referral to Pulmonology - ipratropium-albuterol (DUONEB) 0.5-2.5 (3) MG/3ML nebulizer solution 3 mL; Take 3 mLs by nebulization once. - Follow up as needed - Wheezing had resolved after duoneb. He felt as though he was breathing easier    Dorothyann Peng, NP    Dorothyann Peng, NP

## 2016-05-21 DIAGNOSIS — H353114 Nonexudative age-related macular degeneration, right eye, advanced atrophic with subfoveal involvement: Secondary | ICD-10-CM | POA: Diagnosis not present

## 2016-05-21 DIAGNOSIS — H353211 Exudative age-related macular degeneration, right eye, with active choroidal neovascularization: Secondary | ICD-10-CM | POA: Diagnosis not present

## 2016-05-30 ENCOUNTER — Other Ambulatory Visit (INDEPENDENT_AMBULATORY_CARE_PROVIDER_SITE_OTHER): Payer: Medicare Other

## 2016-05-30 ENCOUNTER — Ambulatory Visit (INDEPENDENT_AMBULATORY_CARE_PROVIDER_SITE_OTHER)
Admission: RE | Admit: 2016-05-30 | Discharge: 2016-05-30 | Disposition: A | Discharge status: Home / Self Care | Payer: Medicare Other | Source: Ambulatory Visit | Attending: Internal Medicine | Admitting: Internal Medicine

## 2016-05-30 ENCOUNTER — Encounter: Payer: Self-pay | Admitting: Internal Medicine

## 2016-05-30 ENCOUNTER — Ambulatory Visit (INDEPENDENT_AMBULATORY_CARE_PROVIDER_SITE_OTHER): Payer: Medicare Other | Admitting: Internal Medicine

## 2016-05-30 VITALS — BP 108/62 | HR 72 | Ht 66.0 in | Wt 143.0 lb

## 2016-05-30 DIAGNOSIS — J449 Chronic obstructive pulmonary disease, unspecified: Secondary | ICD-10-CM

## 2016-05-30 DIAGNOSIS — Z950 Presence of cardiac pacemaker: Secondary | ICD-10-CM | POA: Diagnosis not present

## 2016-05-30 DIAGNOSIS — R0609 Other forms of dyspnea: Secondary | ICD-10-CM

## 2016-05-30 DIAGNOSIS — I6523 Occlusion and stenosis of bilateral carotid arteries: Secondary | ICD-10-CM

## 2016-05-30 DIAGNOSIS — I10 Essential (primary) hypertension: Secondary | ICD-10-CM | POA: Diagnosis not present

## 2016-05-30 LAB — CBC WITH DIFFERENTIAL/PLATELET
Basophils Absolute: 0.1 10*3/uL (ref 0.0–0.1)
Basophils Relative: 0.9 % (ref 0.0–3.0)
EOS ABS: 0.2 10*3/uL (ref 0.0–0.7)
EOS PCT: 2.2 % (ref 0.0–5.0)
HCT: 37.6 % — ABNORMAL LOW (ref 39.0–52.0)
Hemoglobin: 12.8 g/dL — ABNORMAL LOW (ref 13.0–17.0)
LYMPHS ABS: 1.1 10*3/uL (ref 0.7–4.0)
Lymphocytes Relative: 12 % (ref 12.0–46.0)
MCHC: 34 g/dL (ref 30.0–36.0)
MCV: 93.8 fl (ref 78.0–100.0)
MONO ABS: 1.6 10*3/uL — AB (ref 0.1–1.0)
Monocytes Relative: 17.1 % — ABNORMAL HIGH (ref 3.0–12.0)
NEUTROS PCT: 67.8 % (ref 43.0–77.0)
Neutro Abs: 6.4 10*3/uL (ref 1.4–7.7)
Platelets: 267 10*3/uL (ref 150.0–400.0)
RBC: 4.01 Mil/uL — AB (ref 4.22–5.81)
RDW: 14.4 % (ref 11.5–15.5)
WBC: 9.4 10*3/uL (ref 4.0–10.5)

## 2016-05-30 LAB — BASIC METABOLIC PANEL
BUN: 24 mg/dL — AB (ref 6–23)
CALCIUM: 8.9 mg/dL (ref 8.4–10.5)
CHLORIDE: 105 meq/L (ref 96–112)
CO2: 30 meq/L (ref 19–32)
CREATININE: 1.3 mg/dL (ref 0.40–1.50)
GFR: 55.6 mL/min — ABNORMAL LOW (ref >60.00)
Glucose, Bld: 96 mg/dL (ref 70–99)
POTASSIUM: 4.5 meq/L (ref 3.5–5.1)
Sodium: 137 mEq/L (ref 135–145)

## 2016-05-30 LAB — BRAIN NATRIURETIC PEPTIDE: PRO B NATRI PEPTIDE: 89 pg/mL (ref 0.0–100.0)

## 2016-05-30 MED ORDER — IRBESARTAN 150 MG PO TABS: 150.0000 mg | ORAL_TABLET | Freq: Every day | ORAL | 11 refills | Status: DC

## 2016-05-30 MED ORDER — SYMBICORT 160-4.5 MCG/ACT IN AERO: INHALATION_SPRAY | RESPIRATORY_TRACT | 0 refills | Status: DC

## 2016-05-30 NOTE — Patient Instructions (Addendum)
Plan A = Automatic =  Symbicort 160  Take 2 puffs first thing in am and then another 2 puffs about 12 hours later.    Work on inhaler technique:  relax and gently blow all the way out then take a nice smooth deep breath back in, triggering the inhaler at same time you start breathing in.  Hold for up to 5 seconds if you can. Blow out thru nose. Rinse and gargle with water when done    Plan B = Backup Only use your albuterol (ventolin) as a rescue medication to be used if you can't catch your breath by resting or doing a relaxed purse lip breathing pattern.  - The less you use it, the better it will work when you need it. - Ok to use the inhaler up to 2 puffs  every 4 hours if you must but call for appointment if use goes up over your usual need - Don't leave home without it !!  (think of it like the spare tire for your car)   Stop altace and start avapro 150 mg one daily - break in half if too strong   Please remember to go to the lab and x-ray department downstairs in the basement  for your tests - we will call you with the results when they are available.     Please schedule a follow up office visit in 6 weeks, call sooner if needed

## 2016-05-30 NOTE — Progress Notes (Signed)
Subjective:     Patient ID: Luis Townsend, male   DOB: 03-Jan-1929,    MRN: [id number]  HPI   78 yowm quit smoking  02/2011 no problem with sob/cough and new cough/sob started around 2015 and worse since  fall 2017 referred to pulmonary clinic 05/30/2016 by Dorothyann Peng  With ? Copd    05/30/2016 1st [facility] office visit/ Luis Townsend  On ACEi / symbicort  Chief Complaint  Patient presents with  . Pulmonary Consult    Referred by Dorothyann Peng. Pt c/o SOB for the past 2-3 yrs, worse since Jan 2018. He reports he gets SOB walking a few hundred ft, for example waslking to his mailbox. He does fine at rest. He has also had cough that comes and goes. Cough is occ prod with clear sputum. He finds it difficult to produce any sputum lately.   acutely ill in fall 2017 uri/pna and never completely recovered with persistent moderatedly severe daily cough min production and much worse doe = MMRC3 = can't walk 100 yards even at a slow pace at a flat grade s stopping due to sob    No obvious day to day or daytime variability or assoc excess/ purulent sputum or mucus plugs or hemoptysis or cp or chest tightness, subjective wheeze or overt sinus or hb symptoms. No unusual exp hx or h/o childhood pna/ asthma or knowledge of premature birth.  Sleeping ok without nocturnal  or early am exacerbation  of respiratory  c/o's or need for noct saba. Also denies any obvious fluctuation of symptoms with weather or environmental changes or other aggravating or alleviating factors except as outlined above   Current Medications, Allergies, Complete Past Medical History, Past Surgical History, Family History, and Social History were reviewed in [employer] record.  ROS  The following are not active complaints unless bolded sore throat, dysphagia, dental problems, itching, sneezing,  nasal congestion or excess/ purulent secretions, ear ache,   fever, chills, sweats, unintended wt loss, classically  pleuritic or exertional cp,  orthopnea pnd or leg swelling, presyncope, palpitations, abdominal pain, anorexia, nausea, vomiting, diarrhea  or change in bowel or bladder habits, change in stools or urine, dysuria,hematuria,  rash, arthralgias, visual complaints, headache, numbness, weakness or ataxia or problems with walking or coordination,  change in mood/affect or memory.            Review of Systems     Objective:   Physical Exam    Pleasant amb hoarse  wm  nad  Wt Readings from Last 3 Encounters:  05/30/16 143 lb (64.9 kg)  04/29/16 147 lb (66.7 kg)  04/15/16 148 lb (67.1 kg)    Vital signs reviewed - Note on arrival 02 sats  97% on RA      HEENT: nl dentition, turbinates bilaterally, and oropharynx. Nl external ear canals without cough reflex   NECK :  without JVD/Nodes/TM/ nl carotid upstrokes bilaterally   LUNGS: no acc muscle use,  Nl contour chest which is clear to A and P bilaterally without cough on insp or exp maneuvers   CV:  RRR  no s3 or murmur or increase in P2, and trace 1+ sym lower ext edema   ABD:  soft and nontender with nl inspiratory excursion in the supine position. No bruits or organomegaly appreciated, bowel sounds nl  MS:  Nl gait/ ext warm without deformities, calf tenderness, cyanosis or clubbing No obvious joint restrictions   SKIN: warm and dry without lesions  NEURO:  alert, approp, nl sensorium with  no motor or cerebellar deficits apparent.     .CXR PA and Lateral:   05/30/2016 :    I personally reviewed images and agree with radiology impression as follows:    1.  No acute cardiopulmonary process. 2. Hyperinflated lungs     Labs ordered/ reviewed:      Chemistry      Component Value Date/Time   NA 137 05/30/2016 1518   K 4.5 05/30/2016 1518   CL 105 05/30/2016 1518   CO2 30 05/30/2016 1518   BUN 24 (H) 05/30/2016 1518   CREATININE 1.30 05/30/2016 1518   CREATININE 1.14 (H) 11/26/2015 1118      Component Value  Date/Time   CALCIUM 8.9 05/30/2016 1518   ALKPHOS 81 08/28/2015 0836   AST 18 08/28/2015 0836   ALT 20 08/28/2015 0836   BILITOT 0.6 08/28/2015 0836        Lab Results  Component Value Date   WBC 9.4 05/30/2016   HGB 12.8 (L) 05/30/2016   HCT 37.6 (L) 05/30/2016   MCV 93.8 05/30/2016   PLT 267.0 05/30/2016       Lab Results  Component Value Date   PROBNP 89.0 05/30/2016             Assessment:

## 2016-06-01 NOTE — Assessment & Plan Note (Addendum)
Symptoms are markedly disproportionate to objective findings and not clear this is a lung problem but pt does appear to have difficult airway management issues. DDX of  difficult airways management almost all start with A and  include Adherence, Ace Inhibitors, Acid Reflux, Active Sinus Disease, Alpha 1 Antitripsin deficiency, Anxiety masquerading as Airways dz,  ABPA,  Allergy(esp in young), Aspiration (esp in elderly), Adverse effects of meds,  Active smokers, A bunch of PE's (a small clot burden can't cause this syndrome unless there is already severe underlying pulm or vascular dz with poor reserve) plus two Bs  = Bronchiectasis and Beta blocker use..and one C= CHF   Adherence is always the initial "prime suspect" and is a multilayered concern that requires a "trust but verify" approach in every patient - starting with knowing how to use medications, especially inhalers, correctly, keeping up with refills and understanding the fundamental difference between maintenance and prns vs those medications only taken for a very short course and then stopped and not refilled.  - see copd for hfa teaching  - return to clinic with all meds in hand using a trust but verify approach to confirm accurate Medication  Reconciliation The principal here is that until we are certain that the  patients are doing what we've asked, it makes no sense to ask them to do more.    ? ACEi  Adverse effect > top of the list of usual suspcects  ? Adverse effects of dpi's esp advair > changed to symb 160   ? Allergy/ asthma > symb 160 should cover this ok for now   ? Anxiety/depression/ deconditioning > dx of exclusion   ? BB > may need alternative to coreg as Strongly prefer in setting of significant airflow obst requiring beta agonists : Bystolic, the most beta -1  selective Beta blocker available in sample form, with bisoprolol the most selective generic choice  on the market.   ? CHF > excluded with BNP < 100    Will  re-eval off advair/acei in 6 weeks  Total time devoted to counseling  > 50 % of initial 60 min office visit:  review case with pt/ discussion of options/alternatives/ personally creating written customized instructions  in presence of pt  then going over those specific  Instructions directly with the pt including how to use all of the meds but in particular covering each new medication in detail and the difference between the maintenance= "automatic" meds and the prns using an action plan format for the latter (If this problem/symptom => do that organization reading Left to right).  Please see AVS from this visit for a full list of these instructions which I personally wrote for this pt and  are unique to this visit.

## 2016-06-01 NOTE — Assessment & Plan Note (Signed)
In the best review of chronic cough to date ( NEJM 2016 375 [phone]) ,  ACEi are now felt to cause cough in up to  20% of pts which is a 4 fold increase from previous reports and does not include the variety of non-specific complaints we see in pulmonary clinic in pts on ACEi but previously attributed to another dx like  Copd/asthma and  include PNDS, throat and chest congestion, "bronchitis", unexplained dyspnea and noct "strangling" sensations, and hoarseness, but also  atypical /refractory GERD symptoms like dysphagia and "bad heartburn"   The only way I know  to prove this is not an "ACEi Case" is a trial off ACEi x a minimum of 6 weeks then regroup.   Try avapro 150 mg daily

## 2016-06-01 NOTE — Assessment & Plan Note (Signed)
Quit smoking 2013 - PFT's  07/09/11  FEV1 1.5 (69 % ) ratio 51  p 21 % improvement from saba p ? prior to study with DLCO  64/65c % corrects to 74 % for alv volume   - 05/30/2016  After extensive coaching HFA effectiveness =    75% try change from advair to symbicort 160  Need trial of new laba/ics here and consider adding lama per [facility] (respimat) if not better p 6 week trial   see avs for instructions unique to this ov

## 2016-06-02 NOTE — Progress Notes (Signed)
Spoke with pt and notified of results per Dr. Wert. Pt verbalized understanding and denied any questions. 

## 2016-06-09 ENCOUNTER — Other Ambulatory Visit: Payer: Self-pay | Admitting: Adult Health

## 2016-07-11 ENCOUNTER — Ambulatory Visit (INDEPENDENT_AMBULATORY_CARE_PROVIDER_SITE_OTHER): Payer: Medicare Other | Admitting: Internal Medicine

## 2016-07-11 ENCOUNTER — Encounter: Payer: Self-pay | Admitting: Internal Medicine

## 2016-07-11 VITALS — BP 126/72 | HR 77 | Ht 66.0 in | Wt 142.0 lb

## 2016-07-11 DIAGNOSIS — I10 Essential (primary) hypertension: Secondary | ICD-10-CM

## 2016-07-11 DIAGNOSIS — J449 Chronic obstructive pulmonary disease, unspecified: Secondary | ICD-10-CM

## 2016-07-11 DIAGNOSIS — I6523 Occlusion and stenosis of bilateral carotid arteries: Secondary | ICD-10-CM | POA: Diagnosis not present

## 2016-07-11 MED ORDER — BUDESONIDE-FORMOTEROL FUMARATE 80-4.5 MCG/ACT IN AERO: INHALATION_SPRAY | RESPIRATORY_TRACT | 11 refills | Status: DC

## 2016-07-11 NOTE — Patient Instructions (Addendum)
When you finish the present symbicort 160 change to 80 2 pffs every 12 hours   Work on inhaler technique:  relax and gently blow all the way out then take a nice smooth deep breath back in, triggering the inhaler at same time you start breathing in.  Hold for up to 5 seconds if you can. Blow out thru nose. Rinse and gargle with water when done   Remember to pace yourself     Please schedule a follow up office visit in 6 weeks, call sooner if needed with pfts on return or first available

## 2016-07-11 NOTE — Progress Notes (Signed)
Subjective:     Patient ID: Luis Townsend, male   DOB: 1928-12-02,    MRN: [id number]     Brief patient profile:  16 yowm quit smoking  02/2011 no problem with sob/cough and new cough/sob started around 2015 and worse since  fall 2017 referred to pulmonary clinic 05/30/2016 by Luis Townsend  With ? Copd    05/30/2016 1st [facility] office visit/ Luis Townsend  On ACEi / symbicort  Chief Complaint  Patient presents with  . Pulmonary Consult    Referred by Luis Townsend. Pt c/o SOB for the past 2-3 yrs, worse since Jan 2018. He reports he gets SOB walking a few hundred ft, for example waslking to his mailbox. He does fine at rest. He has also had cough that comes and goes. Cough is occ prod with clear sputum. He finds it difficult to produce any sputum lately.   acutely ill in fall 2017 uri/pna and never completely recovered with persistent moderatedly severe daily cough min production and much worse doe = MMRC3 = can't walk 100 yards even at a slow pace at a flat grade s stopping due to sob   rec Plan A = Automatic =  Symbicort 160  Take 2 puffs first thing in am and then another 2 puffs about 12 hours later.  Work on inhaler technique:   Plan B = Backup Only use your albuterol (ventolin)  stop altace and start avapro 150 mg one daily - break in half if too strong     07/11/2016  f/u ov/Luis Townsend re:  COPD  GOLD II / symb 160 2bid / off acei x 6 weeks  Chief Complaint  Patient presents with  . Follow-up    Breathing has improved and he is coughing less. He is using ventolin hfa 3-4 x per wk on average.      Less albuterol now, sleeping fine s am exac  Doe = MMRC2 = can't walk a nl pace on a flat grade s sob but does fine slow and flat   No obvious day to day or daytime variability or assoc excess/ purulent sputum or mucus plugs or hemoptysis or cp or chest tightness, subjective wheeze or overt sinus or hb symptoms. No unusual exp hx or h/o childhood pna/ asthma or knowledge of premature  birth.  Sleeping ok without nocturnal  or early am exacerbation  of respiratory  c/o's or need for noct saba. Also denies any obvious fluctuation of symptoms with weather or environmental changes or other aggravating or alleviating factors except as outlined above   Current Medications, Allergies, Complete Past Medical History, Past Surgical History, Family History, and Social History were reviewed in [employer] record.  ROS  The following are not active complaints unless bolded sore throat, dysphagia, dental problems, itching, sneezing,  nasal congestion or excess/ purulent secretions, ear ache,   fever, chills, sweats, unintended wt loss, classically pleuritic or exertional cp,  orthopnea pnd or leg swelling, presyncope, palpitations, abdominal pain, anorexia, nausea, vomiting, diarrhea  or change in bowel or bladder habits, change in stools or urine, dysuria,hematuria,  rash, arthralgias, visual complaints, headache, numbness, weakness or ataxia or problems with walking or coordination,  change in mood/affect or memory.                    Objective:   Physical Exam    Pleasant amb  wm  nad   07/11/2016        142  05/30/16 143 lb (64.9 kg)  04/29/16 147 lb (66.7 kg)  04/15/16 148 lb (67.1 kg)    Vital signs reviewed - Note on arrival 02 sats  98% on RA  And BP 126/72    HEENT: nl dentition, turbinates bilaterally, and oropharynx. Nl external ear canals without cough reflex   NECK :  without JVD/Nodes/TM/ nl carotid upstrokes bilaterally   LUNGS: no acc muscle use, slt barrel contour with minimal exp rhonchi better with plm    CV:  RRR  no s3 or murmur or increase in P2, and trace  sym lower ext edema   ABD:  soft and nontender with nl inspiratory excursion in the supine position. No bruits or organomegaly appreciated, bowel sounds nl  MS:  Nl gait/ ext warm without deformities, calf tenderness, cyanosis or clubbing No obvious joint restrictions    SKIN: warm and dry without lesions    NEURO:  alert, approp, nl sensorium with  no motor or cerebellar deficits apparent.     CXR PA and Lateral:   05/30/2016 :    I personally reviewed images and agree with radiology impression as follows:   1.  No acute cardiopulmonary process. 2. Hyperinflated lungs     Labs  reviewed:     Chemistry      Component Value Date/Time   NA 137 05/30/2016 1518   K 4.5 05/30/2016 1518   CL 105 05/30/2016 1518   CO2 30 05/30/2016 1518   BUN 24 (H) 05/30/2016 1518   CREATININE 1.30 05/30/2016 1518   CREATININE 1.14 (H) 11/26/2015 1118      Component Value Date/Time   CALCIUM 8.9 05/30/2016 1518   ALKPHOS 81 08/28/2015 0836   AST 18 08/28/2015 0836   ALT 20 08/28/2015 0836   BILITOT 0.6 08/28/2015 0836        Lab Results  Component Value Date   WBC 9.4 05/30/2016   HGB 12.8 (L) 05/30/2016   HCT 37.6 (L) 05/30/2016   MCV 93.8 05/30/2016   PLT 267.0 05/30/2016       Lab Results  Component Value Date   PROBNP 89.0 05/30/2016             Assessment:

## 2016-07-13 NOTE — Assessment & Plan Note (Signed)
Adequate control on present rx, reviewed in detail with pt > no change in rx needed    Although even in retrospect it may not be clear the ACEi contributed to the pt's symptoms,  Pt improved off them and adding them back at this point or in the future would risk confusion in interpretation of non-specific respiratory symptoms to which this patient is prone  ie  Better not to muddy the waters here.

## 2016-07-13 NOTE — Assessment & Plan Note (Signed)
Quit smoking 2013 - PFT's  07/09/11  FEV1 1.5 (69 % ) ratio 51  p 21 % improvement from saba p ? prior to study with DLCO  64/65c % corrects to 74 % for alv volume   - 05/30/2016    try change from advair to symbicort 160  - 07/11/2016  After extensive coaching HFA effectiveness =    90%   Clearly improved   on symbicort but ? Is that because he stopped acei also   Since most of his symptoms were upper airway, it may be that less symbicort will work out to be better than more, so ok to change to  80 2bid  When finishes with the 160 and return here for final f/u ov with pfts  I had an extended discussion with the patient reviewing all relevant studies completed to date and  lasting 15 to 20 minutes of a 25 minute visit    Each maintenance medication was reviewed in detail including most importantly the difference between maintenance and prns and under what circumstances the prns are to be triggered using an action plan format that is not reflected in the computer generated alphabetically organized AVS.    Please see AVS for specific instructions unique to this visit that I personally wrote and verbalized to the the pt in detail and then reviewed with pt  by my nurse highlighting any  changes in therapy recommended at today's visit to their plan of care.

## 2016-07-15 ENCOUNTER — Ambulatory Visit (INDEPENDENT_AMBULATORY_CARE_PROVIDER_SITE_OTHER): Payer: Medicare Other | Admitting: *Deleted

## 2016-07-15 DIAGNOSIS — I442 Atrioventricular block, complete: Secondary | ICD-10-CM

## 2016-07-15 NOTE — Progress Notes (Signed)
Remote pacemaker transmission.   

## 2016-07-18 LAB — CUP PACEART REMOTE DEVICE CHECK
Battery Voltage: 2.96 V
Brady Statistic AP VP Percent: 89.39 %
Brady Statistic AS VP Percent: 10.46 %
Brady Statistic RA Percent Paced: 89.23 %
Brady Statistic RV Percent Paced: 99.85 %
Implantable Lead Implant Date: 20131002
Implantable Lead Implant Date: 20171019
Implantable Lead Location: 753859
Implantable Lead Model: 4298
Implantable Lead Model: 5076
Implantable Lead Model: 5076
Lead Channel Impedance Value: 1007 Ohm
Lead Channel Impedance Value: 1026 Ohm
Lead Channel Impedance Value: 342 Ohm
Lead Channel Impedance Value: 494 Ohm
Lead Channel Impedance Value: 741 Ohm
Lead Channel Impedance Value: 950 Ohm
Lead Channel Pacing Threshold Amplitude: 1 V
Lead Channel Sensing Intrinsic Amplitude: 2.5 mV
Lead Channel Sensing Intrinsic Amplitude: 2.5 mV
Lead Channel Setting Pacing Amplitude: 2 V
Lead Channel Setting Pacing Pulse Width: 0.4 ms
Lead Channel Setting Pacing Pulse Width: 1 ms
Lead Channel Setting Sensing Sensitivity: 4 mV
MDC IDC LEAD IMPLANT DT: 20131002
MDC IDC LEAD LOCATION: 753858
MDC IDC LEAD LOCATION: 753860
MDC IDC MSMT BATTERY REMAINING LONGEVITY: 46 mo
MDC IDC MSMT LEADCHNL LV IMPEDANCE VALUE: 380 Ohm
MDC IDC MSMT LEADCHNL LV IMPEDANCE VALUE: 456 Ohm
MDC IDC MSMT LEADCHNL LV IMPEDANCE VALUE: 475 Ohm
MDC IDC MSMT LEADCHNL LV IMPEDANCE VALUE: 684 Ohm
MDC IDC MSMT LEADCHNL LV IMPEDANCE VALUE: 703 Ohm
MDC IDC MSMT LEADCHNL LV PACING THRESHOLD AMPLITUDE: 2.75 V
MDC IDC MSMT LEADCHNL LV PACING THRESHOLD PULSEWIDTH: 1 ms
MDC IDC MSMT LEADCHNL RA IMPEDANCE VALUE: 361 Ohm
MDC IDC MSMT LEADCHNL RA IMPEDANCE VALUE: 399 Ohm
MDC IDC MSMT LEADCHNL RV IMPEDANCE VALUE: 399 Ohm
MDC IDC MSMT LEADCHNL RV PACING THRESHOLD PULSEWIDTH: 0.4 ms
MDC IDC PG IMPLANT DT: 20171019
MDC IDC SESS DTM: 20180605042340
MDC IDC SET LEADCHNL LV PACING AMPLITUDE: 3.75 V
MDC IDC SET LEADCHNL RA PACING AMPLITUDE: 2.5 V
MDC IDC STAT BRADY AP VS PERCENT: 0.04 %
MDC IDC STAT BRADY AS VS PERCENT: 0.11 %

## 2016-07-22 ENCOUNTER — Encounter: Payer: Self-pay | Admitting: Cardiology

## 2016-07-31 DIAGNOSIS — H353132 Nonexudative age-related macular degeneration, bilateral, intermediate dry stage: Secondary | ICD-10-CM | POA: Diagnosis not present

## 2016-07-31 DIAGNOSIS — H31092 Other chorioretinal scars, left eye: Secondary | ICD-10-CM | POA: Diagnosis not present

## 2016-07-31 DIAGNOSIS — H2512 Age-related nuclear cataract, left eye: Secondary | ICD-10-CM | POA: Diagnosis not present

## 2016-07-31 DIAGNOSIS — Z961 Presence of intraocular lens: Secondary | ICD-10-CM | POA: Diagnosis not present

## 2016-08-04 DIAGNOSIS — D225 Melanocytic nevi of trunk: Secondary | ICD-10-CM | POA: Diagnosis not present

## 2016-08-04 DIAGNOSIS — B353 Tinea pedis: Secondary | ICD-10-CM | POA: Diagnosis not present

## 2016-08-04 DIAGNOSIS — L309 Dermatitis, unspecified: Secondary | ICD-10-CM | POA: Diagnosis not present

## 2016-08-04 DIAGNOSIS — L821 Other seborrheic keratosis: Secondary | ICD-10-CM | POA: Diagnosis not present

## 2016-08-04 DIAGNOSIS — L218 Other seborrheic dermatitis: Secondary | ICD-10-CM | POA: Diagnosis not present

## 2016-08-04 DIAGNOSIS — L570 Actinic keratosis: Secondary | ICD-10-CM | POA: Diagnosis not present

## 2016-08-07 ENCOUNTER — Encounter: Payer: Self-pay | Admitting: Cardiology

## 2016-08-18 ENCOUNTER — Other Ambulatory Visit: Payer: Self-pay | Admitting: Adult Health

## 2016-08-27 DIAGNOSIS — H35351 Cystoid macular degeneration, right eye: Secondary | ICD-10-CM | POA: Diagnosis not present

## 2016-08-27 DIAGNOSIS — H353123 Nonexudative age-related macular degeneration, left eye, advanced atrophic without subfoveal involvement: Secondary | ICD-10-CM | POA: Diagnosis not present

## 2016-08-27 DIAGNOSIS — H353212 Exudative age-related macular degeneration, right eye, with inactive choroidal neovascularization: Secondary | ICD-10-CM | POA: Diagnosis not present

## 2016-08-27 DIAGNOSIS — H43822 Vitreomacular adhesion, left eye: Secondary | ICD-10-CM | POA: Diagnosis not present

## 2016-08-27 DIAGNOSIS — H353114 Nonexudative age-related macular degeneration, right eye, advanced atrophic with subfoveal involvement: Secondary | ICD-10-CM | POA: Diagnosis not present

## 2016-08-29 ENCOUNTER — Other Ambulatory Visit: Payer: Self-pay

## 2016-10-14 ENCOUNTER — Ambulatory Visit (INDEPENDENT_AMBULATORY_CARE_PROVIDER_SITE_OTHER): Payer: Medicare Other | Admitting: *Deleted

## 2016-10-14 DIAGNOSIS — I442 Atrioventricular block, complete: Secondary | ICD-10-CM | POA: Diagnosis not present

## 2016-10-15 ENCOUNTER — Other Ambulatory Visit: Payer: Self-pay | Admitting: Adult Health

## 2016-10-15 NOTE — Telephone Encounter (Signed)
Sent to the pharmacy by e-scribe.  Seen by [name] for COPD on 04/29/16.  Also being followed by pulmonary.

## 2016-10-16 NOTE — Progress Notes (Signed)
Remote pacemaker transmission.   

## 2016-10-22 ENCOUNTER — Encounter: Payer: Self-pay | Admitting: Cardiology

## 2016-10-23 LAB — CUP PACEART REMOTE DEVICE CHECK
Battery Remaining Longevity: 42 mo
Brady Statistic AP VP Percent: 88.2 %
Brady Statistic AS VS Percent: 0.12 %
Brady Statistic RA Percent Paced: 88.04 %
Brady Statistic RV Percent Paced: 99.84 %
Date Time Interrogation Session: 20180904042248
Implantable Lead Implant Date: 20131002
Implantable Lead Implant Date: 20171019
Implantable Lead Location: 753858
Implantable Lead Location: 753859
Implantable Lead Model: 4298
Implantable Lead Model: 5076
Implantable Lead Model: 5076
Implantable Pulse Generator Implant Date: 20171019
Lead Channel Impedance Value: 323 Ohm
Lead Channel Impedance Value: 380 Ohm
Lead Channel Impedance Value: 399 Ohm
Lead Channel Impedance Value: 418 Ohm
Lead Channel Impedance Value: 646 Ohm
Lead Channel Impedance Value: 836 Ohm
Lead Channel Impedance Value: 836 Ohm
Lead Channel Pacing Threshold Amplitude: 1 V
Lead Channel Pacing Threshold Amplitude: 2.25 V
Lead Channel Pacing Threshold Pulse Width: 0.4 ms
Lead Channel Sensing Intrinsic Amplitude: 2 mV
Lead Channel Sensing Intrinsic Amplitude: 2 mV
Lead Channel Setting Pacing Amplitude: 2.5 V
Lead Channel Setting Sensing Sensitivity: 4 mV
MDC IDC LEAD IMPLANT DT: 20131002
MDC IDC LEAD LOCATION: 753860
MDC IDC MSMT BATTERY VOLTAGE: 2.95 V
MDC IDC MSMT LEADCHNL LV IMPEDANCE VALUE: 475 Ohm
MDC IDC MSMT LEADCHNL LV IMPEDANCE VALUE: 532 Ohm
MDC IDC MSMT LEADCHNL LV IMPEDANCE VALUE: 627 Ohm
MDC IDC MSMT LEADCHNL LV IMPEDANCE VALUE: 760 Ohm
MDC IDC MSMT LEADCHNL LV PACING THRESHOLD PULSEWIDTH: 1 ms
MDC IDC MSMT LEADCHNL RA IMPEDANCE VALUE: 323 Ohm
MDC IDC MSMT LEADCHNL RV IMPEDANCE VALUE: 323 Ohm
MDC IDC MSMT LEADCHNL RV IMPEDANCE VALUE: 399 Ohm
MDC IDC SET LEADCHNL LV PACING AMPLITUDE: 3.5 V
MDC IDC SET LEADCHNL LV PACING PULSEWIDTH: 1 ms
MDC IDC SET LEADCHNL RV PACING AMPLITUDE: 2.25 V
MDC IDC SET LEADCHNL RV PACING PULSEWIDTH: 0.4 ms
MDC IDC STAT BRADY AP VS PERCENT: 0.04 %
MDC IDC STAT BRADY AS VP PERCENT: 11.65 %

## 2016-10-29 DIAGNOSIS — H43822 Vitreomacular adhesion, left eye: Secondary | ICD-10-CM | POA: Diagnosis not present

## 2016-10-29 DIAGNOSIS — H353114 Nonexudative age-related macular degeneration, right eye, advanced atrophic with subfoveal involvement: Secondary | ICD-10-CM | POA: Diagnosis not present

## 2016-10-29 DIAGNOSIS — H353212 Exudative age-related macular degeneration, right eye, with inactive choroidal neovascularization: Secondary | ICD-10-CM | POA: Diagnosis not present

## 2016-10-29 DIAGNOSIS — H353123 Nonexudative age-related macular degeneration, left eye, advanced atrophic without subfoveal involvement: Secondary | ICD-10-CM | POA: Diagnosis not present

## 2016-10-31 ENCOUNTER — Encounter: Payer: Self-pay | Admitting: Adult Health

## 2016-11-18 ENCOUNTER — Other Ambulatory Visit: Payer: Self-pay | Admitting: Adult Health

## 2016-11-18 NOTE — Telephone Encounter (Signed)
Pt need a yearly?

## 2016-11-19 NOTE — Telephone Encounter (Signed)
He does need yearly. Ok to give him an inhaler +

## 2016-11-19 NOTE — Telephone Encounter (Signed)
Left a message for a return call.  Pt needs CPX/yearly

## 2016-11-20 NOTE — Telephone Encounter (Signed)
Spoke to the pt and he is now scheduled for yearly on 12/26/16 @ 9AM.  Pt notified to come fasting.

## 2016-11-24 ENCOUNTER — Telehealth: Payer: Self-pay | Admitting: *Deleted

## 2016-11-24 NOTE — Telephone Encounter (Signed)
Patient walked in the office today and requested to be referred to someone about his foot pain, patient see's [name] I made him aware [name] is not in the office today but I can schedule him to see someone else for this problem. Patient states he does not want to be seen, he would like to know where he can go to be seen for the problem he is having from the arch in his foot. Please call patient at [phone]

## 2016-11-25 NOTE — Telephone Encounter (Signed)
Spoke to the pt and informed him to go to [facility] per [facility].  Pt agreed and will call to make an appt.  No further action needed.  Will close note.

## 2016-12-02 DIAGNOSIS — H34812 Central retinal vein occlusion, left eye, with macular edema: Secondary | ICD-10-CM | POA: Diagnosis not present

## 2016-12-02 DIAGNOSIS — H35352 Cystoid macular degeneration, left eye: Secondary | ICD-10-CM | POA: Diagnosis not present

## 2016-12-02 DIAGNOSIS — Z961 Presence of intraocular lens: Secondary | ICD-10-CM | POA: Diagnosis not present

## 2016-12-02 DIAGNOSIS — H353212 Exudative age-related macular degeneration, right eye, with inactive choroidal neovascularization: Secondary | ICD-10-CM | POA: Diagnosis not present

## 2016-12-02 DIAGNOSIS — H26491 Other secondary cataract, right eye: Secondary | ICD-10-CM | POA: Diagnosis not present

## 2016-12-02 DIAGNOSIS — H35032 Hypertensive retinopathy, left eye: Secondary | ICD-10-CM | POA: Diagnosis not present

## 2016-12-02 DIAGNOSIS — H348122 Central retinal vein occlusion, left eye, stable: Secondary | ICD-10-CM | POA: Diagnosis not present

## 2016-12-02 DIAGNOSIS — H35042 Retinal micro-aneurysms, unspecified, left eye: Secondary | ICD-10-CM | POA: Diagnosis not present

## 2016-12-02 DIAGNOSIS — H353123 Nonexudative age-related macular degeneration, left eye, advanced atrophic without subfoveal involvement: Secondary | ICD-10-CM | POA: Diagnosis not present

## 2016-12-02 DIAGNOSIS — H25812 Combined forms of age-related cataract, left eye: Secondary | ICD-10-CM | POA: Diagnosis not present

## 2016-12-08 ENCOUNTER — Ambulatory Visit (INDEPENDENT_AMBULATORY_CARE_PROVIDER_SITE_OTHER): Payer: Medicare Other | Admitting: *Deleted

## 2016-12-08 DIAGNOSIS — Z23 Encounter for immunization: Secondary | ICD-10-CM

## 2016-12-22 ENCOUNTER — Other Ambulatory Visit: Payer: Self-pay | Admitting: Cardiovascular Disease

## 2016-12-26 ENCOUNTER — Encounter: Payer: Self-pay | Admitting: Adult Health

## 2016-12-26 ENCOUNTER — Ambulatory Visit (INDEPENDENT_AMBULATORY_CARE_PROVIDER_SITE_OTHER): Payer: Medicare Other | Admitting: Adult Health

## 2016-12-26 VITALS — BP 96/50 | Temp 97.4°F | Ht 65.0 in | Wt 144.0 lb

## 2016-12-26 DIAGNOSIS — I6523 Occlusion and stenosis of bilateral carotid arteries: Secondary | ICD-10-CM | POA: Diagnosis not present

## 2016-12-26 DIAGNOSIS — E7800 Pure hypercholesterolemia, unspecified: Secondary | ICD-10-CM

## 2016-12-26 DIAGNOSIS — I10 Essential (primary) hypertension: Secondary | ICD-10-CM | POA: Diagnosis not present

## 2016-12-26 DIAGNOSIS — J449 Chronic obstructive pulmonary disease, unspecified: Secondary | ICD-10-CM | POA: Diagnosis not present

## 2016-12-26 DIAGNOSIS — Z23 Encounter for immunization: Secondary | ICD-10-CM | POA: Diagnosis not present

## 2016-12-26 LAB — CBC WITH DIFFERENTIAL/PLATELET
BASOS ABS: 0.1 10*3/uL (ref 0.0–0.1)
Basophils Relative: 1.2 % (ref 0.0–3.0)
Eosinophils Absolute: 0.3 10*3/uL (ref 0.0–0.7)
Eosinophils Relative: 3 % (ref 0.0–5.0)
HCT: 45.3 % (ref 39.0–52.0)
Hemoglobin: 15.2 g/dL (ref 13.0–17.0)
LYMPHS ABS: 1.4 10*3/uL (ref 0.7–4.0)
Lymphocytes Relative: 16.5 % (ref 12.0–46.0)
MCHC: 33.6 g/dL (ref 30.0–36.0)
MCV: 96.8 fl (ref 78.0–100.0)
MONO ABS: 0.9 10*3/uL (ref 0.1–1.0)
MONOS PCT: 10.8 % (ref 3.0–12.0)
NEUTROS ABS: 5.9 10*3/uL (ref 1.4–7.7)
NEUTROS PCT: 68.5 % (ref 43.0–77.0)
PLATELETS: 206 10*3/uL (ref 150.0–400.0)
RBC: 4.68 Mil/uL (ref 4.22–5.81)
RDW: 13.9 % (ref 11.5–15.5)
WBC: 8.6 10*3/uL (ref 4.0–10.5)

## 2016-12-26 LAB — TSH: TSH: 1.97 u[IU]/mL (ref 0.35–4.50)

## 2016-12-26 LAB — LIPID PANEL
CHOLESTEROL: 265 mg/dL — AB (ref 0–200)
HDL: 54.7 mg/dL (ref >39.00)
LDL Cholesterol: 196 mg/dL — ABNORMAL HIGH (ref 0–99)
NONHDL: 210.42
Total CHOL/HDL Ratio: 5
Triglycerides: 74 mg/dL (ref 0.0–149.0)
VLDL: 14.8 mg/dL (ref 0.0–40.0)

## 2016-12-26 LAB — BASIC METABOLIC PANEL
BUN: 32 mg/dL — AB (ref 6–23)
CO2: 33 mEq/L — ABNORMAL HIGH (ref 19–32)
CREATININE: 1.47 mg/dL (ref 0.40–1.50)
Calcium: 9.1 mg/dL (ref 8.4–10.5)
Chloride: 100 mEq/L (ref 96–112)
GFR: 48.18 mL/min — AB (ref >60.00)
GLUCOSE: 138 mg/dL — AB (ref 70–99)
Potassium: 3.8 mEq/L (ref 3.5–5.1)
SODIUM: 140 meq/L (ref 135–145)

## 2016-12-26 LAB — HEPATIC FUNCTION PANEL
ALK PHOS: 93 U/L (ref 39–117)
ALT: 16 U/L (ref 0–53)
AST: 18 U/L (ref 0–37)
Albumin: 3.8 g/dL (ref 3.5–5.2)
BILIRUBIN DIRECT: 0.1 mg/dL (ref 0.0–0.3)
TOTAL PROTEIN: 6.4 g/dL (ref 6.0–8.3)
Total Bilirubin: 0.7 mg/dL (ref 0.2–1.2)

## 2016-12-26 MED ORDER — IRBESARTAN 75 MG PO TABS: 75.0000 mg | ORAL_TABLET | Freq: Every day | ORAL | 1 refills | Status: DC

## 2016-12-26 NOTE — Progress Notes (Signed)
Subjective:    Patient ID: Luis Townsend, male    DOB: 12-Oct-1928, 83 y.o.   MRN: [id number]  HPI Patient presents for yearly follow up exam. He is a pleasant 83 year old male who  has a past medical history of Amaurosis fugax of left eye (10/11/1996), Aortic stenosis (06/30/2011), Aortoiliac occlusive disease ([place]) (06/30/2011), Cardiac resynchronization therapy pacemaker (CRT-P) in place, Cerebrovascular disease (06/30/2011), CHB (complete heart block) ([place]) (02/03/2013), COPD (chronic obstructive pulmonary disease), moderat to severe (07/29/2011), Coronary artery disease, Hyperlipidemia, Hypertension, Myocardial infarction ([place]) (03/22/1994), PAD (peripheral artery disease) (HCC), RBBB (right bundle branch block), S/P CABG x 5 (03/28/1994), S/P carotid endarterectomy (10/28/1996), Subclavian artery stenosis, left ([place]) (06/30/2011), and Tobacco abuse.   He is followed by   Dr. Melvyn Novas - Pulmonary for COPD GOLD II - currently controlled on inhalers   Dr. Curt Bears - Primary Electrophysiologist due to history of sinus bradycardia and complete heart block (pacemaker dependent).   Dr. Sallyanne Kuster - Primary Cardiologist due to history of extensive cardiac issues ( CAD S/P CABG, AS S/P TAVR, PAD with multiple vessel involvement (carotid stenosis S/P left carotid endarterectomy, S/P left subclavian stent), hyperlipidemia, hypertension )   All immunizations and health maintenance protocols were reviewed with the patient and needed orders were placed.  Appropriate screening laboratory values were ordered for the patient including screening of hyperlipidemia, renal function and hepatic function.  Medication reconciliation,  past medical history, social history, problem list and allergies were reviewed in detail with the patient  Goals were established with regard to weight loss, exercise, and  diet in compliance with medications  End of life planning was discussed.  He denies any symptoms of palpitations, CP,  SOB, lower extremity , dizziness, syncope, or orthopnea  He has no acute complaints today  Review of Systems  Constitutional: Negative.   HENT: Negative.   Eyes: Negative.   Respiratory: Negative.   Cardiovascular: Negative.   Gastrointestinal: Negative.   Endocrine: Negative.   Genitourinary: Negative.   Musculoskeletal: Negative.   Skin: Negative.   Allergic/Immunologic: Negative.   Neurological: Negative.   Hematological: Negative.   Psychiatric/Behavioral: Negative.   All other systems reviewed and are negative.  Past Medical History:  Diagnosis Date  . Amaurosis fugax of left eye 10/11/1996  . Aortic stenosis 06/30/2011   severe  . Aortoiliac occlusive disease ([place]) 06/30/2011  . Cardiac resynchronization therapy pacemaker (CRT-P) in place    a. 11/29/15:  Medtronic model E3PI95 Percepta Quad CRT-P MRI SureScan (serial Number [id number] )  . Cerebrovascular disease 06/30/2011  . CHB (complete heart block) ([place]) 02/03/2013  . COPD (chronic obstructive pulmonary disease), moderat to severe 07/29/2011  . Coronary artery disease    a. 10/29/15 Cath for decreased EF with patent 2/3 grafts, no change   . Hyperlipidemia   . Hypertension   . Myocardial infarction ([place]) 03/22/1994   acute LAD occlusion  . PAD (peripheral artery disease) ([place])   . RBBB (right bundle branch block)   . S/P CABG x 5 03/28/1994   LIMA to LAD, sequential SVG to OM1-OM2-OM4, SVG to RCA, open vein harvest right leg - Dr Redmond Pulling  . S/P carotid endarterectomy 10/28/1996   Dr Donnetta Hutching  . Subclavian artery stenosis, left ([place]) 06/30/2011  . Tobacco abuse     Social History   Socioeconomic History  . Marital status: Married    Spouse name: Not on file  . Number of children: Not on file  . Years of education: Not on file  .  Highest education level: Not on file  Social Needs  . Financial resource strain: Not on file  . Food insecurity - worry: Not on file  . Food insecurity - inability: Not on file  .  Transportation needs - medical: Not on file  . Transportation needs - non-medical: Not on file  Occupational History  . Not on file  Tobacco Use  . Smoking status: Former Smoker    Packs/day: 1.00    Years: 60.00    Pack years: 60.00    Types: Cigarettes    Last attempt to quit: 03/02/2011    Years since quitting: 5.8  . Smokeless tobacco: Never Used  Substance and Sexual Activity  . Alcohol use: No    Alcohol/week: 0.0 oz  . Drug use: No  . Sexual activity: Not on file  Other Topics Concern  . Not on file  Social History Narrative   Married for 63 years   Three children -    1. Son in [place]   2. Daughter in [place]   3.Son in [place]      Two grandchildren        Past Surgical History:  Procedure Laterality Date  . BILATERAL UPPER EXTREMITY ANGIOGRAM N/A 07/29/2011   Procedure: BILATERAL UPPER EXTREMITY ANGIOGRAM;  Surgeon: Lorretta Harp, MD;  Location[facility] CATH LAB;  Service: Cardiovascular;  Laterality: N/A;  . CARDIAC CATHETERIZATION  06/24/2011   No intervention - Recommendation-angioplasty/stenting of Lft subclavian artery, high risk replacement of aortic valve w/ biological prosthesis and redo CABG w/ preservation of LIMA bypass and new SVG to PDA and OM, reevaluate carotid arteries and pulmonary function prior to surgery.  Marland Kitchen CARDIAC CATHETERIZATION N/A 10/29/2015   Procedure: Coronary/Graft Angiography;  Surgeon: Lorretta Harp, MD;  Location: [facility] CV LAB;  Service: Cardiovascular;  Laterality: N/A;  . CARDIAC VALVE REPLACEMENT    . CARDIOVASCULAR STRESS TEST  07/10/2009   Evidence of mild ischemia in Basal Anteriolateral and Mid Anterolateral regions-additional diaphragmatic attenuation. No ECG changes. Low risk.\  . CAROTID DOPPLER  10/18/2012   Rt bulb/prox ICA demonstrated 50-69% diameter reduction, Lft mid ICA demonstrated 50-69% diameter reduction, Lft subclavian- = to or <50% sig diameter reduction, Lft vertebral-appeared occluded  . CAROTID ENDARTERECTOMY   10/28/96  DR. TODD EARLY   LEFT AND DACRON PATCH ANGIOPLASTY  . CORONARY ARTERY BYPASS GRAFT  03/28/94 DR. CHARLES WILSON   x5(LIMA to LAD,SVG to OM1-OM2-OM4, SVG to RCA  . EP IMPLANTABLE DEVICE N/A 11/29/2015   Procedure: BiV Upgrade;  Surgeon: Will Meredith Leeds, MD;  Location: [place] CV LAB;  Service: Cardiovascular;  Laterality: N/A;  . EYE SURGERY    . LEFT HEART CATHETERIZATION WITH CORONARY/GRAFT ANGIOGRAM N/A 06/24/2011   Procedure: LEFT HEART CATHETERIZATION WITH Beatrix Fetters;  Surgeon: Sanda Klein, MD;  Location: [facility] CATH LAB;  Service: Cardiovascular;  Laterality: N/A;  . PACEMAKER INSERTION  11/12/2011   Medtronic Adapta; model# ADDR01, serial# [id number]  . PERIPHERAL VASCULAR ANGIOPLASTY  07/29/2011   Lft Subclavian 90% stenosis, stented w/ a 7x36mm long Cordis Genesis resulting in reduction of 90% stenosos to 0% resdiual.  . TRANSTHORACIC ECHOCARDIOGRAM  08/05/2012   EF 50-55%, septial motion showed abnormal function and dyssynergy, LA mild-moderately dilated    Family History  Problem Relation Age of Onset  . Stroke Mother   . Heart attack Father   . Heart disease Father   . Pneumonia Maternal Grandmother   . Diabetes Maternal Grandfather   . Heart attack Paternal  Grandmother     No Known Allergies  Current Outpatient Medications on File Prior to Visit  Medication Sig Dispense Refill  . albuterol (VENTOLIN HFA) 108 (90 Base) MCG/ACT inhaler TAKE 2 PUFFS BY MOUTH EVERY 4 HOURS AS NEEDED 18 Inhaler 0  . aspirin EC 81 MG tablet Take 81 mg by mouth daily.    . budesonide-formoterol (SYMBICORT) 80-4.5 MCG/ACT inhaler Take 2 puffs first thing in am and then another 2 puffs about 12 hours later. 1 Inhaler 11  . carvedilol (COREG) 6.25 MG tablet TAKE 1 TABLET (6.25 MG TOTAL) BY MOUTH 2 (TWO) TIMES DAILY WITH A MEAL. 60 tablet 0  . furosemide (LASIX) 20 MG tablet Take 1 tablet by mouth daily.  3  . irbesartan (AVAPRO) 150 MG tablet Take 1 tablet (150 mg total)  by mouth daily. 30 tablet 11  . rosuvastatin (CRESTOR) 40 MG tablet Take 1 tablet (40 mg total) by mouth daily. 90 tablet 3   No current facility-administered medications on file prior to visit.     BP (!) 96/50 (BP Location: Right Arm)   Temp (!) 97.4 F (36.3 C) (Oral)   Ht 5\' 5"  (1.651 m)   Wt 144 lb (65.3 kg)   BMI 23.96 kg/m       Objective:   Physical Exam  Constitutional: He is oriented to person, place, and time. He appears well-developed and well-nourished. No distress.  HENT:  Head: Normocephalic and atraumatic.  Right Ear: External ear normal.  Left Ear: External ear normal.  Nose: Nose normal.  Mouth/Throat: Oropharynx is clear and moist. No oropharyngeal exudate.  Eyes: Conjunctivae and EOM are normal. Pupils are equal, round, and reactive to light. Right eye exhibits no discharge. No scleral icterus.  Neck: Normal range of motion. Neck supple. No JVD present. No tracheal deviation present. No thyromegaly present.  Cardiovascular: Normal rate, regular rhythm, normal heart sounds and intact distal pulses. Exam reveals no gallop and no friction rub.  No murmur heard. Pulmonary/Chest: Effort normal and breath sounds normal. No stridor. No respiratory distress. He has no wheezes. He has no rales. He exhibits no tenderness.  Abdominal: Soft. Bowel sounds are normal. He exhibits no distension and no mass. There is no tenderness. There is no rebound and no guarding.  Musculoskeletal: Normal range of motion. He exhibits no edema or deformity.  Lymphadenopathy:    He has no cervical adenopathy.  Neurological: He is alert and oriented to person, place, and time. He has normal reflexes. He displays normal reflexes. No cranial nerve deficit. He exhibits normal muscle tone. Coordination normal.  Skin: Skin is warm and dry. No rash noted. He is not diaphoretic. No erythema. No pallor.  Psychiatric: He has a normal mood and affect. His behavior is normal. Judgment and thought  content normal.  Nursing note and vitals reviewed.     Assessment & Plan:  1. Essential hypertension - hypotensive today but not symptomatic. I am going to have him take 75 mg of Avapro   - Basic metabolic panel - CBC with Differential/Platelet - Lipid panel - TSH - Hepatic function panel  2. COPD GOLD II - Continue with plan of care set forth by pulmonary   3. Pure hypercholesterolemia  - Basic metabolic panel - CBC with Differential/Platelet - Lipid panel - TSH - Hepatic function panel  4. Need for vaccination against Streptococcus pneumoniae  - Pneumococcal conjugate vaccine 13-valent  Dorothyann Peng, NP

## 2016-12-26 NOTE — Patient Instructions (Signed)
It was great seeing you today   I will follow up with you regarding your labs   Your blood pressure is a little low today, I am going to have you cut the Avapro in half.

## 2017-01-13 ENCOUNTER — Telehealth: Payer: Self-pay | Admitting: Cardiology

## 2017-01-13 ENCOUNTER — Ambulatory Visit (INDEPENDENT_AMBULATORY_CARE_PROVIDER_SITE_OTHER): Payer: Medicare Other | Admitting: *Deleted

## 2017-01-13 DIAGNOSIS — I442 Atrioventricular block, complete: Secondary | ICD-10-CM

## 2017-01-13 NOTE — Telephone Encounter (Signed)
Spoke with pt and reminded pt of remote transmission that is due today. Pt verbalized understanding.   

## 2017-01-14 ENCOUNTER — Encounter: Payer: Self-pay | Admitting: Cardiology

## 2017-01-14 DIAGNOSIS — H35042 Retinal micro-aneurysms, unspecified, left eye: Secondary | ICD-10-CM | POA: Diagnosis not present

## 2017-01-14 DIAGNOSIS — H353114 Nonexudative age-related macular degeneration, right eye, advanced atrophic with subfoveal involvement: Secondary | ICD-10-CM | POA: Diagnosis not present

## 2017-01-14 DIAGNOSIS — H353123 Nonexudative age-related macular degeneration, left eye, advanced atrophic without subfoveal involvement: Secondary | ICD-10-CM | POA: Diagnosis not present

## 2017-01-14 DIAGNOSIS — H34812 Central retinal vein occlusion, left eye, with macular edema: Secondary | ICD-10-CM | POA: Diagnosis not present

## 2017-01-14 LAB — CUP PACEART REMOTE DEVICE CHECK
Battery Remaining Longevity: 26 mo
Battery Voltage: 2.93 V
Brady Statistic AS VP Percent: 13.54 %
Brady Statistic RA Percent Paced: 86.01 %
Brady Statistic RV Percent Paced: 99.6 %
Implantable Lead Implant Date: 20131002
Implantable Lead Implant Date: 20131002
Implantable Lead Location: 753858
Implantable Lead Location: 753859
Implantable Lead Model: 4298
Implantable Lead Model: 5076
Implantable Pulse Generator Implant Date: 20171019
Lead Channel Impedance Value: 361 Ohm
Lead Channel Impedance Value: 361 Ohm
Lead Channel Impedance Value: 399 Ohm
Lead Channel Impedance Value: 437 Ohm
Lead Channel Impedance Value: 475 Ohm
Lead Channel Impedance Value: 703 Ohm
Lead Channel Impedance Value: 836 Ohm
Lead Channel Impedance Value: 912 Ohm
Lead Channel Impedance Value: 931 Ohm
Lead Channel Pacing Threshold Pulse Width: 1 ms
Lead Channel Sensing Intrinsic Amplitude: 2.5 mV
Lead Channel Sensing Intrinsic Amplitude: 2.5 mV
Lead Channel Setting Pacing Amplitude: 2 V
Lead Channel Setting Sensing Sensitivity: 4 mV
MDC IDC LEAD IMPLANT DT: 20171019
MDC IDC LEAD LOCATION: 753860
MDC IDC MSMT LEADCHNL LV IMPEDANCE VALUE: 494 Ohm
MDC IDC MSMT LEADCHNL LV IMPEDANCE VALUE: 608 Ohm
MDC IDC MSMT LEADCHNL LV IMPEDANCE VALUE: 665 Ohm
MDC IDC MSMT LEADCHNL LV PACING THRESHOLD AMPLITUDE: 2.75 V
MDC IDC MSMT LEADCHNL RV IMPEDANCE VALUE: 380 Ohm
MDC IDC MSMT LEADCHNL RV IMPEDANCE VALUE: 475 Ohm
MDC IDC MSMT LEADCHNL RV PACING THRESHOLD AMPLITUDE: 1 V
MDC IDC MSMT LEADCHNL RV PACING THRESHOLD PULSEWIDTH: 0.4 ms
MDC IDC SESS DTM: 20181205014624
MDC IDC SET LEADCHNL LV PACING AMPLITUDE: 4.75 V
MDC IDC SET LEADCHNL LV PACING PULSEWIDTH: 1 ms
MDC IDC SET LEADCHNL RA PACING AMPLITUDE: 2.5 V
MDC IDC SET LEADCHNL RV PACING PULSEWIDTH: 0.4 ms
MDC IDC STAT BRADY AP VP PERCENT: 86.06 %
MDC IDC STAT BRADY AP VS PERCENT: 0.08 %
MDC IDC STAT BRADY AS VS PERCENT: 0.31 %

## 2017-01-14 NOTE — Progress Notes (Signed)
Remote pacemaker transmission.   

## 2017-02-02 ENCOUNTER — Ambulatory Visit (INDEPENDENT_AMBULATORY_CARE_PROVIDER_SITE_OTHER): Payer: Medicare Other | Admitting: Cardiology

## 2017-02-02 ENCOUNTER — Encounter: Payer: Self-pay | Admitting: Cardiology

## 2017-02-02 VITALS — BP 192/80 | HR 62 | Resp 94 | Ht 65.0 in | Wt 145.0 lb

## 2017-02-02 DIAGNOSIS — Z952 Presence of prosthetic heart valve: Secondary | ICD-10-CM | POA: Diagnosis not present

## 2017-02-02 DIAGNOSIS — I6523 Occlusion and stenosis of bilateral carotid arteries: Secondary | ICD-10-CM

## 2017-02-02 DIAGNOSIS — I10 Essential (primary) hypertension: Secondary | ICD-10-CM

## 2017-02-02 DIAGNOSIS — I25810 Atherosclerosis of coronary artery bypass graft(s) without angina pectoris: Secondary | ICD-10-CM | POA: Diagnosis not present

## 2017-02-02 DIAGNOSIS — Z45018 Encounter for adjustment and management of other part of cardiac pacemaker: Secondary | ICD-10-CM | POA: Diagnosis not present

## 2017-02-02 DIAGNOSIS — I442 Atrioventricular block, complete: Secondary | ICD-10-CM

## 2017-02-02 NOTE — Progress Notes (Signed)
Electrophysiology Office Note   Date:  02/02/2017   ID:  Luis Townsend, DOB [birth year], MRN [id number]  PCP:  Dorothyann Peng, NP  Cardiologist:  [place] Primary Electrophysiologist:  Shylie Polo Meredith Leeds, MD    Chief Complaint  Patient presents with  . Pacemaker Check     History of Present Illness: Luis Townsend is a 83 y.o. male who presents today for electrophysiology evaluation.   Hx sinus bradycardia and complete heart block (pacemaker dependent), CAD S/P CABG, AS S/P TAVR, PAD with multiple vessel involvement (carotid stenosis S/P left carotid endarterectomy, S/P left subclavian stent), hyperlipidemia, hypertension and COPD. Pacemaker interrogation July 28, shows 82.5% atrial pacing and 100% ventricular pacing without evidence of atrial fibrillation or ventricular tachycardia. He is pacemaker dependent. Follow-up echocardiogram performed on August 3 showed a drastic and unexpected reduction left ventricular systolic function, with EF down to 35-40%and diffuse hypokinesis.Marland Kitchen  CRT P on 11/28/16.  Repeat echo on 03/21/16 showed improvement of his EF to 50-55%.    Today, denies symptoms of palpitations, chest pain, shortness of breath, orthopnea, PND, lower extremity edema, claudication, dizziness, presyncope, syncope, bleeding, or neurologic sequela. The patient is tolerating medications without difficulties.  He has been mildly short of breath over the past few days.  Device interrogation shows that his optimal has been elevated, but has been slowly improving over the last day.  He otherwise feels well without complaint.  Past Medical History:  Diagnosis Date  . Amaurosis fugax of left eye 10/11/1996  . Aortic stenosis 06/30/2011   severe  . Aortoiliac occlusive disease ([place]) 06/30/2011  . Cardiac resynchronization therapy pacemaker (CRT-P) in place    a. 11/29/15:  Medtronic model I2LN98 Percepta Quad CRT-P MRI SureScan (serial Number [id number] )  . Cerebrovascular disease 06/30/2011   . CHB (complete heart block) ([place]) 02/03/2013  . COPD (chronic obstructive pulmonary disease), moderat to severe 07/29/2011  . Coronary artery disease    a. 10/29/15 Cath for decreased EF with patent 2/3 grafts, no change   . Hyperlipidemia   . Hypertension   . Myocardial infarction ([place]) 03/22/1994   acute LAD occlusion  . PAD (peripheral artery disease) ([place])   . RBBB (right bundle branch block)   . S/P CABG x 5 03/28/1994   LIMA to LAD, sequential SVG to OM1-OM2-OM4, SVG to RCA, open vein harvest right leg - Dr Redmond Pulling  . S/P carotid endarterectomy 10/28/1996   Dr Donnetta Hutching  . Subclavian artery stenosis, left ([place]) 06/30/2011  . Tobacco abuse    Past Surgical History:  Procedure Laterality Date  . BILATERAL UPPER EXTREMITY ANGIOGRAM N/A 07/29/2011   Procedure: BILATERAL UPPER EXTREMITY ANGIOGRAM;  Surgeon: Lorretta Harp, MD;  Location: [facility] CATH LAB;  Service: Cardiovascular;  Laterality: N/A;  . CARDIAC CATHETERIZATION  06/24/2011   No intervention - Recommendation-angioplasty/stenting of Lft subclavian artery, high risk replacement of aortic valve w/ biological prosthesis and redo CABG w/ preservation of LIMA bypass and new SVG to PDA and OM, reevaluate carotid arteries and pulmonary function prior to surgery.  Marland Kitchen CARDIAC CATHETERIZATION N/A 10/29/2015   Procedure: Coronary/Graft Angiography;  Surgeon: Lorretta Harp, MD;  Location: [place] CV LAB;  Service: Cardiovascular;  Laterality: N/A;  . CARDIAC VALVE REPLACEMENT    . CARDIOVASCULAR STRESS TEST  07/10/2009   Evidence of mild ischemia in Basal Anteriolateral and Mid Anterolateral regions-additional diaphragmatic attenuation. No ECG changes. Low risk.\  . CAROTID DOPPLER  10/18/2012   Rt bulb/prox ICA demonstrated 50-69% diameter reduction,  Lft mid ICA demonstrated 50-69% diameter reduction, Lft subclavian- = to or <50% sig diameter reduction, Lft vertebral-appeared occluded  . CAROTID ENDARTERECTOMY  10/28/96  DR. TODD EARLY   LEFT  AND DACRON PATCH ANGIOPLASTY  . CORONARY ARTERY BYPASS GRAFT  03/28/94 DR. CHARLES WILSON   x5(LIMA to LAD,SVG to OM1-OM2-OM4, SVG to RCA  . EP IMPLANTABLE DEVICE N/A 11/29/2015   Procedure: BiV Upgrade;  Surgeon: Anaia Frith Meredith Leeds, MD;  Location: [place] CV LAB;  Service: Cardiovascular;  Laterality: N/A;  . EYE SURGERY    . LEFT HEART CATHETERIZATION WITH CORONARY/GRAFT ANGIOGRAM N/A 06/24/2011   Procedure: LEFT HEART CATHETERIZATION WITH Beatrix Fetters;  Surgeon: Sanda Klein, MD;  Location: [facility] CATH LAB;  Service: Cardiovascular;  Laterality: N/A;  . PACEMAKER INSERTION  11/12/2011   Medtronic Adapta; model# ADDR01, serial# [id number]  . PERIPHERAL VASCULAR ANGIOPLASTY  07/29/2011   Lft Subclavian 90% stenosis, stented w/ a 7x65mm long Cordis Genesis resulting in reduction of 90% stenosos to 0% resdiual.  . TRANSTHORACIC ECHOCARDIOGRAM  08/05/2012   EF 50-55%, septial motion showed abnormal function and dyssynergy, LA mild-moderately dilated     Current Outpatient Medications  Medication Sig Dispense Refill  . albuterol (VENTOLIN HFA) 108 (90 Base) MCG/ACT inhaler TAKE 2 PUFFS BY MOUTH EVERY 4 HOURS AS NEEDED 18 Inhaler 0  . aspirin EC 81 MG tablet Take 81 mg by mouth daily.    . budesonide-formoterol (SYMBICORT) 80-4.5 MCG/ACT inhaler Take 2 puffs first thing in am and then another 2 puffs about 12 hours later. 1 Inhaler 11  . carvedilol (COREG) 6.25 MG tablet TAKE 1 TABLET (6.25 MG TOTAL) BY MOUTH 2 (TWO) TIMES DAILY WITH A MEAL. 60 tablet 0  . furosemide (LASIX) 20 MG tablet Take 1 tablet by mouth daily.  3  . irbesartan (AVAPRO) 75 MG tablet Take 1 tablet (75 mg total) daily by mouth. 90 tablet 1  . rosuvastatin (CRESTOR) 40 MG tablet Take 1 tablet (40 mg total) by mouth daily. 90 tablet 3   No current facility-administered medications for this visit.     Allergies:   Patient has no known allergies.   Social History:  The patient  reports that he quit smoking about  5 years ago. His smoking use included cigarettes. He has a 60.00 pack-year smoking history. he has never used smokeless tobacco. He reports that he does not drink alcohol or use drugs.   Family History:  The patient's family history includes Diabetes in his maternal grandfather; Heart attack in his father and paternal grandmother; Heart disease in his father; Pneumonia in his maternal grandmother; Stroke in his mother.   ROS:  Please see the history of present illness.   Otherwise, review of systems is positive for DOE.   All other systems are reviewed and negative.   PHYSICAL EXAM: VS:  BP (!) 192/80   Pulse 62   Resp (!) 94   Ht 5\' 5"  (1.651 m)   Wt 145 lb (65.8 kg)   BMI 24.13 kg/m  , BMI Body mass index is 24.13 kg/m. GEN: Well nourished, well developed, in no acute distress  HEENT: normal  Neck: no JVD, carotid bruits, or masses Cardiac: RRR; no murmurs, rubs, or gallops,no edema  Respiratory:  clear to auscultation bilaterally, normal work of breathing GI: soft, nontender, nondistended, + BS MS: no deformity or atrophy  Skin: warm and dry, device site well healed Neuro:  Strength and sensation are intact Psych: euthymic mood, full affect  EKG:  EKG is ordered today. Personal review of the ekg ordered shows AV paced  Personal review of the device interrogation today. Results in [place]: 05/30/2016: Pro B Natriuretic peptide (BNP) 89.0 12/26/2016: ALT 16; BUN 32; Creatinine, Ser 1.47; Hemoglobin 15.2; Platelets 206.0; Potassium 3.8; Sodium 140; TSH 1.97    Lipid Panel     Component Value Date/Time   CHOL 265 (H) 12/26/2016 0939   TRIG 74.0 12/26/2016 0939   HDL 54.70 12/26/2016 0939   CHOLHDL 5 12/26/2016 0939   VLDL 14.8 12/26/2016 0939   LDLCALC 196 (H) 12/26/2016 0939     Wt Readings from Last 3 Encounters:  02/02/17 145 lb (65.8 kg)  12/26/16 144 lb (65.3 kg)  07/11/16 142 lb (64.4 kg)      Other studies Reviewed: Additional studies/  records that were reviewed today include: TTE 09/13/15, Coronary angiography 10/29/15  Review of the above records today demonstrates:  - Left ventricle: The cavity size was normal. Wall thickness was   increased in a pattern of mild LVH. Septal-lateral dyssynchrony   noted. Indeterminant diastolic function. Systolic function was   moderately reduced. The estimated ejection fraction was in the   range of 35% to 40%. Diffuse hypokinesis. - Aortic valve: Bioprosthetic aortic valve s/p TAVR. No significant   stenosis. Trivial peri-valvular regurgitation. Mean gradient (S):   12 mm Hg. - Mitral valve: There was trivial regurgitation. - Right ventricle: The cavity size was normal. Pacer wire or   catheter noted in right ventricle. Systolic function was mildly   reduced. - Pulmonary arteries: No complete TR doppler jet so unable to   estimate PA systolic pressure. - Inferior vena cava: The vessel was normal in size. The   respirophasic diameter changes were in the normal range (= 50%),   consistent with normal central venous pressure.   Prox RCA to Mid RCA lesion, 100 %stenosed.  Ost LAD to Prox LAD lesion, 100 %stenosed.  Ost LM lesion, 80 %stenosed.  Ost Cx to Prox Cx lesion, 90 %stenosed.  Origin to Prox Graft lesion, 100 %stenosed.   ASSESSMENT AND PLAN:  1.  Complete AV block: Status post dual-chamber pacemaker with upgrade to CRT P in 38/17/71 due to systolic heart failure.  Device functioning appropriately today.  No changes.    2. Systolc heart failure: Ejection fraction was decreased due to RV pacing.  Did have CRT P upgrade which resulted in improvement in his ejection fraction.  He was recently short of breath with an increase in his optive all which has started to come down.  At this time he would prefer not to have any further medical treatment as his optive all is improving.  No changes.  3. Aortic stenosis: Status post TAVR 2013  4. CAD s/p CABG: No current angina  5.  Hypertension: Her blood pressure improved on recheck to 110/68.  No changes.  Current medicines are reviewed at length with the patient today.   The patient has concerns regarding his medicines.  The following changes were made today:  none  Labs/ tests ordered today include:  Orders Placed This Encounter  Procedures  . EKG 12-Lead     Disposition:   FU with Rual Vermeer 12 months  Signed, Demetruis Depaul Meredith Leeds, MD  02/02/2017 8:22 AM     [facility] [address] [place] [address] [phone] (office) [phone] (fax)

## 2017-02-02 NOTE — Patient Instructions (Addendum)
Medication Instructions:  Your physician recommends that you continue on your current medications as directed. Please refer to the Current Medication list given to you today.  *If you need a refill on your cardiac medications before your next appointment, please call your pharmacy*  Labwork: None ordered  Testing/Procedures: None ordered  Follow-Up: Remote monitoring is used to monitor your Pacemaker or ICD from home. This monitoring reduces the number of office visits required to check your device to one time per year. It allows [place] to keep an eye on the functioning of your device to ensure it is working properly. You are scheduled for a device check from home on 04/14/2017. You may send your transmission at any time that day. If you have a wireless device, the transmission will be sent automatically. After your physician reviews your transmission, you will receive a postcard with your next transmission date.  Your physician wants you to follow-up in: 1 year with Dr. Curt Bears.  You will receive a reminder letter in the mail two months in advance. If you don't receive a letter, please call our office to schedule the follow-up appointment.  Thank you for choosing [facility]!!   Trinidad Curet, RN [phone]

## 2017-02-07 LAB — CUP PACEART INCLINIC DEVICE CHECK
Implantable Lead Implant Date: 20131002
Implantable Lead Implant Date: 20171019
Implantable Lead Location: 753860
Implantable Lead Model: 4298
Implantable Lead Model: 5076
Implantable Lead Model: 5076
Implantable Pulse Generator Implant Date: 20171019
MDC IDC LEAD IMPLANT DT: 20131002
MDC IDC LEAD LOCATION: 753858
MDC IDC LEAD LOCATION: 753859
MDC IDC SESS DTM: 20181229113350

## 2017-02-24 ENCOUNTER — Other Ambulatory Visit: Payer: Self-pay | Admitting: Adult Health

## 2017-02-24 NOTE — Telephone Encounter (Signed)
Last OV 12/26/2016  Rx last refilled 11/20/2016 disp 18, with no refills.   Rx sent

## 2017-03-02 ENCOUNTER — Other Ambulatory Visit: Payer: Self-pay | Admitting: Cardiovascular Disease

## 2017-03-05 DIAGNOSIS — H35042 Retinal micro-aneurysms, unspecified, left eye: Secondary | ICD-10-CM | POA: Diagnosis not present

## 2017-03-05 DIAGNOSIS — H353123 Nonexudative age-related macular degeneration, left eye, advanced atrophic without subfoveal involvement: Secondary | ICD-10-CM | POA: Diagnosis not present

## 2017-03-05 DIAGNOSIS — H353114 Nonexudative age-related macular degeneration, right eye, advanced atrophic with subfoveal involvement: Secondary | ICD-10-CM | POA: Diagnosis not present

## 2017-03-05 DIAGNOSIS — H34812 Central retinal vein occlusion, left eye, with macular edema: Secondary | ICD-10-CM | POA: Diagnosis not present

## 2017-04-11 ENCOUNTER — Other Ambulatory Visit: Payer: Self-pay | Admitting: Adult Health

## 2017-04-14 ENCOUNTER — Ambulatory Visit (INDEPENDENT_AMBULATORY_CARE_PROVIDER_SITE_OTHER): Payer: Medicare Other | Admitting: *Deleted

## 2017-04-14 DIAGNOSIS — I442 Atrioventricular block, complete: Secondary | ICD-10-CM | POA: Diagnosis not present

## 2017-04-14 LAB — CUP PACEART REMOTE DEVICE CHECK
Battery Remaining Longevity: 30 mo
Brady Statistic AP VP Percent: 88.81 %
Brady Statistic AP VS Percent: 0.04 %
Brady Statistic AS VP Percent: 11.02 %
Brady Statistic AS VS Percent: 0.14 %
Brady Statistic RV Percent Paced: 99.82 %
Date Time Interrogation Session: 20190305042501
Implantable Lead Implant Date: 20131002
Implantable Lead Implant Date: 20171019
Implantable Lead Location: 753860
Implantable Lead Model: 5076
Lead Channel Impedance Value: 361 Ohm
Lead Channel Impedance Value: 380 Ohm
Lead Channel Impedance Value: 437 Ohm
Lead Channel Impedance Value: 513 Ohm
Lead Channel Impedance Value: 608 Ohm
Lead Channel Impedance Value: 722 Ohm
Lead Channel Impedance Value: 741 Ohm
Lead Channel Impedance Value: 836 Ohm
Lead Channel Pacing Threshold Amplitude: 0.875 V
Lead Channel Pacing Threshold Amplitude: 2.375 V
Lead Channel Pacing Threshold Pulse Width: 0.4 ms
Lead Channel Setting Pacing Amplitude: 2.5 V
Lead Channel Setting Pacing Amplitude: 4 V
Lead Channel Setting Pacing Pulse Width: 0.4 ms
Lead Channel Setting Pacing Pulse Width: 1 ms
MDC IDC LEAD IMPLANT DT: 20131002
MDC IDC LEAD LOCATION: 753858
MDC IDC LEAD LOCATION: 753859
MDC IDC MSMT BATTERY VOLTAGE: 2.93 V
MDC IDC MSMT LEADCHNL LV IMPEDANCE VALUE: 380 Ohm
MDC IDC MSMT LEADCHNL LV IMPEDANCE VALUE: 494 Ohm
MDC IDC MSMT LEADCHNL LV IMPEDANCE VALUE: 532 Ohm
MDC IDC MSMT LEADCHNL LV IMPEDANCE VALUE: 931 Ohm
MDC IDC MSMT LEADCHNL LV IMPEDANCE VALUE: 950 Ohm
MDC IDC MSMT LEADCHNL LV PACING THRESHOLD PULSEWIDTH: 1 ms
MDC IDC MSMT LEADCHNL RA IMPEDANCE VALUE: 399 Ohm
MDC IDC MSMT LEADCHNL RA SENSING INTR AMPL: 2.625 mV
MDC IDC MSMT LEADCHNL RA SENSING INTR AMPL: 2.625 mV
MDC IDC PG IMPLANT DT: 20171019
MDC IDC SET LEADCHNL RV PACING AMPLITUDE: 2 V
MDC IDC SET LEADCHNL RV SENSING SENSITIVITY: 4 mV
MDC IDC STAT BRADY RA PERCENT PACED: 88.65 %

## 2017-04-14 NOTE — Progress Notes (Signed)
Remote pacemaker transmission.   

## 2017-04-15 ENCOUNTER — Encounter: Payer: Self-pay | Admitting: Cardiology

## 2017-04-21 ENCOUNTER — Encounter: Payer: Self-pay | Admitting: Family Medicine

## 2017-04-21 ENCOUNTER — Ambulatory Visit (INDEPENDENT_AMBULATORY_CARE_PROVIDER_SITE_OTHER): Payer: Medicare Other | Admitting: Family Medicine

## 2017-04-21 VITALS — BP 140/70 | HR 74 | Temp 97.9°F | Wt 149.8 lb

## 2017-04-21 DIAGNOSIS — J449 Chronic obstructive pulmonary disease, unspecified: Secondary | ICD-10-CM

## 2017-04-21 DIAGNOSIS — I5022 Chronic systolic (congestive) heart failure: Secondary | ICD-10-CM | POA: Diagnosis not present

## 2017-04-21 DIAGNOSIS — R0609 Other forms of dyspnea: Secondary | ICD-10-CM

## 2017-04-21 NOTE — Progress Notes (Signed)
Subjective:     Patient ID: Luis Townsend, male   DOB: 1928/12/30, 84 y.o.   MRN: [id number]  HPI Patient seen with dyspnea which he states has gone on "for years". Progressive especially in recent weeks. Denies any chest pains. He has multiple chronic medical problems including history of nonischemic cardiomyopathy with chronic systolic heart failure, hypertension, sick sinus syndrome, peripheral artery disease, history of complete heart block, CAD, history of aortic valve stenosis with prior aortic valve replacement, history of CABG 5.  Quit smoking 2013-with reported 60 pack year history of smoking. Has reported COPD. Reportedly had some PFTs back in 2013. Chest x-ray on 05/30/2016 showed hyperinflated lungs with no acute cardiopulmonary process. He does take Symbicort and as needed albuterol. Denies any orthopnea. Had echocardiogram 2/18 with ejection fraction 50-55%.   Past Medical History:  Diagnosis Date  . Amaurosis fugax of left eye 10/11/1996  . Aortic stenosis 06/30/2011   severe  . Aortoiliac occlusive disease ([place]) 06/30/2011  . Cardiac resynchronization therapy pacemaker (CRT-P) in place    a. 11/29/15:  Medtronic model N5AO13 Percepta Quad CRT-P MRI SureScan (serial Number [id number] )  . Cerebrovascular disease 06/30/2011  . CHB (complete heart block) ([place]) 02/03/2013  . COPD (chronic obstructive pulmonary disease), moderat to severe 07/29/2011  . Coronary artery disease    a. 10/29/15 Cath for decreased EF with patent 2/3 grafts, no change   . Hyperlipidemia   . Hypertension   . Myocardial infarction ([place]) 03/22/1994   acute LAD occlusion  . PAD (peripheral artery disease) ([place])   . RBBB (right bundle branch block)   . S/P CABG x 5 03/28/1994   LIMA to LAD, sequential SVG to OM1-OM2-OM4, SVG to RCA, open vein harvest right leg - Dr Redmond Pulling  . S/P carotid endarterectomy 10/28/1996   Dr Donnetta Hutching  . Subclavian artery stenosis, left ([place]) 06/30/2011  . Tobacco abuse    Past Surgical  History:  Procedure Laterality Date  . BILATERAL UPPER EXTREMITY ANGIOGRAM N/A 07/29/2011   Procedure: BILATERAL UPPER EXTREMITY ANGIOGRAM;  Surgeon: Lorretta Harp, MD;  [facility] C[facility] LAB;  Service: Cardiovascular;  Laterality: N/A;  . CARDIAC CATHETERIZATION  06/24/2011   No intervention - Recommendation-angioplasty/stenting of Lft subclavian artery, high risk replacement of aortic valve w/ biological prosthesis and redo CABG w/ preservation of LIMA bypass and new SVG to PDA and OM, reevaluate carotid arteries and pulmonary function prior to surgery.  Marland Kitchen CARDIAC CATHETERIZATION N/A 10/29/2015   Procedure: Coronary/Graft Angiography;  Surgeon: Lorretta Harp, MD;  Location: [place] CV LAB;  Service: Cardiovascular;  Laterality: N/A;  . CARDIAC VALVE REPLACEMENT    . CARDIOVASCULAR STRESS TEST  07/10/2009   Evidence of mild ischemia in Basal Anteriolateral and Mid Anterolateral regions-additional diaphragmatic attenuation. No ECG changes. Low risk.\  . CAROTID DOPPLER  10/18/2012   Rt bulb/prox ICA demonstrated 50-69% diameter reduction, Lft mid ICA demonstrated 50-69% diameter reduction, Lft subclavian- = to or <50% sig diameter reduction, Lft vertebral-appeared occluded  . CAROTID ENDARTERECTOMY  10/28/96  DR. TODD EARLY   LEFT AND DACRON PATCH ANGIOPLASTY  . CORONARY ARTERY BYPASS GRAFT  03/28/94 DR. CHARLES WILSON   x5(LIMA to LAD,SVG to OM1-OM2-OM4, SVG to RCA  . EP IMPLANTABLE DEVICE N/A 11/29/2015   Procedure: BiV Upgrade;  Surgeon: Will Meredith Leeds, MD;  Location: [place] CV LAB;  Service: Cardiovascular;  Laterality: N/A;  . EYE SURGERY    . LEFT HEART CATHETERIZATION WITH CORONARY/GRAFT ANGIOGRAM N/A 06/24/2011  Procedure: LEFT HEART CATHETERIZATION WITH Beatrix Fetters;  Surgeon: Sanda Klein, MD;  Location: [facility] CATH LAB;  Service: Cardiovascular;  Laterality: N/A;  . PACEMAKER INSERTION  11/12/2011   Medtronic Adapta; model# ADDR01, serial# [id number]  .  PERIPHERAL VASCULAR ANGIOPLASTY  07/29/2011   Lft Subclavian 90% stenosis, stented w/ a 7x87mm long Cordis Genesis resulting in reduction of 90% stenosos to 0% resdiual.  . TRANSTHORACIC ECHOCARDIOGRAM  08/05/2012   EF 50-55%, septial motion showed abnormal function and dyssynergy, LA mild-moderately dilated    reports that he quit smoking about 6 years ago. His smoking use included cigarettes. He has a 60.00 pack-year smoking history. he has never used smokeless tobacco. He reports that he does not drink alcohol or use drugs. family history includes Diabetes in his maternal grandfather; Heart attack in his father and paternal grandmother; Heart disease in his father; Pneumonia in his maternal grandmother; Stroke in his mother. No Known Allergies   Review of Systems  Constitutional: Negative for chills, fatigue and fever.  Eyes: Negative for visual disturbance.  Respiratory: Positive for shortness of breath. Negative for cough, chest tightness and wheezing.   Cardiovascular: Negative for chest pain and palpitations.  Neurological: Negative for dizziness, syncope, weakness, light-headedness and headaches.       Objective:   Physical Exam  Constitutional: He is oriented to person, place, and time. He appears well-developed and well-nourished.  Neck: Neck supple.  Cardiovascular: Normal rate.  Pulmonary/Chest: Effort normal. He has no wheezes. He has no rales.  Patient has somewhat diminished breath sounds throughout but no wheezes or rales noted.  Musculoskeletal:  He has trace pitting edema lower legs bilaterally  Neurological: He is alert and oriented to person, place, and time.       Assessment:     Progressive dyspnea on exertion-progressive over several months if not years. Question multifactorial. He does have reported history of COPD as well as chronic systolic heart failure.  No evidence on exam for overt CHF.    Plan:     -Patient was ambulated in the hallways for 3 minutes  and oxygen dropped from 96-92%. He was limited to 3 minutes of ambulation secondary to dyspnea. No chest pain -Recommend set up referral back to pulmonary for further evaluation and input. I discussed this with his primary who agrees. -In the meantime continue Symbicort BID and albuterol as needed -Repeat labs with pro BNP level, basic metabolic panel, and CBC  Eulas Post MD [facility] at [facility]

## 2017-04-21 NOTE — Patient Instructions (Signed)
We will set up pulmonary referral.

## 2017-04-23 DIAGNOSIS — H35042 Retinal micro-aneurysms, unspecified, left eye: Secondary | ICD-10-CM | POA: Diagnosis not present

## 2017-04-23 DIAGNOSIS — H353114 Nonexudative age-related macular degeneration, right eye, advanced atrophic with subfoveal involvement: Secondary | ICD-10-CM | POA: Diagnosis not present

## 2017-04-23 DIAGNOSIS — H34812 Central retinal vein occlusion, left eye, with macular edema: Secondary | ICD-10-CM | POA: Diagnosis not present

## 2017-04-23 DIAGNOSIS — H353123 Nonexudative age-related macular degeneration, left eye, advanced atrophic without subfoveal involvement: Secondary | ICD-10-CM | POA: Diagnosis not present

## 2017-04-30 ENCOUNTER — Other Ambulatory Visit (INDEPENDENT_AMBULATORY_CARE_PROVIDER_SITE_OTHER): Payer: Medicare Other

## 2017-04-30 ENCOUNTER — Ambulatory Visit (INDEPENDENT_AMBULATORY_CARE_PROVIDER_SITE_OTHER): Payer: Medicare Other | Admitting: Adult Health

## 2017-04-30 ENCOUNTER — Encounter: Payer: Self-pay | Admitting: Adult Health

## 2017-04-30 ENCOUNTER — Ambulatory Visit (INDEPENDENT_AMBULATORY_CARE_PROVIDER_SITE_OTHER)
Admission: RE | Admit: 2017-04-30 | Discharge: 2017-04-30 | Disposition: A | Discharge status: Home / Self Care | Payer: Medicare Other | Source: Ambulatory Visit | Attending: Adult Health | Admitting: Adult Health

## 2017-04-30 VITALS — BP 114/68 | HR 90 | Ht 66.0 in | Wt 147.4 lb

## 2017-04-30 DIAGNOSIS — J449 Chronic obstructive pulmonary disease, unspecified: Secondary | ICD-10-CM

## 2017-04-30 DIAGNOSIS — R0609 Other forms of dyspnea: Secondary | ICD-10-CM

## 2017-04-30 LAB — CBC WITH DIFFERENTIAL/PLATELET
BASOS ABS: 0.1 10*3/uL (ref 0.0–0.1)
Basophils Relative: 1.1 % (ref 0.0–3.0)
Eosinophils Absolute: 0.2 10*3/uL (ref 0.0–0.7)
Eosinophils Relative: 1.8 % (ref 0.0–5.0)
HCT: 41.5 % (ref 39.0–52.0)
HEMOGLOBIN: 14.2 g/dL (ref 13.0–17.0)
LYMPHS ABS: 1.1 10*3/uL (ref 0.7–4.0)
Lymphocytes Relative: 11.5 % — ABNORMAL LOW (ref 12.0–46.0)
MCHC: 34.2 g/dL (ref 30.0–36.0)
MCV: 95.9 fl (ref 78.0–100.0)
MONOS PCT: 9.4 % (ref 3.0–12.0)
Monocytes Absolute: 0.9 10*3/uL (ref 0.1–1.0)
NEUTROS PCT: 76.2 % (ref 43.0–77.0)
Neutro Abs: 7.5 10*3/uL (ref 1.4–7.7)
Platelets: 204 10*3/uL (ref 150.0–400.0)
RBC: 4.33 Mil/uL (ref 4.22–5.81)
RDW: 13.6 % (ref 11.5–15.5)
WBC: 9.8 10*3/uL (ref 4.0–10.5)

## 2017-04-30 LAB — BRAIN NATRIURETIC PEPTIDE: PRO B NATRI PEPTIDE: 90 pg/mL (ref 0.0–100.0)

## 2017-04-30 MED ORDER — TIOTROPIUM BROMIDE MONOHYDRATE 2.5 MCG/ACT IN AERS: 2.0000 | INHALATION_SPRAY | Freq: Every day | RESPIRATORY_TRACT | 5 refills | Status: DC

## 2017-04-30 MED ORDER — TIOTROPIUM BROMIDE MONOHYDRATE 2.5 MCG/ACT IN AERS: 2.0000 | INHALATION_SPRAY | Freq: Every day | RESPIRATORY_TRACT | 0 refills | Status: DC

## 2017-04-30 NOTE — Progress Notes (Signed)
Patient seen in the office today and instructed on use of Spiriva Respimat 2.36mcg.  Patient expressed understanding and demonstrated technique. Parke Poisson, [facility] 04/30/17

## 2017-04-30 NOTE — Assessment & Plan Note (Signed)
Progressive COPD with drop in FEV1 over last 5 yrs.  Add LAMA  Check cxr and labs   Plan  Patient Instructions  Continue on Symbicort Twice daily , rinse after use  Begin Spiriva 2 puffs daily, rinse after use.  Chest x-ray today Labs today .  Follow up with Dr. Melvyn Novas  In 6 weeks and As needed   Please contact office for sooner follow up if symptoms do not improve or worsen or seek emergency care

## 2017-04-30 NOTE — Progress Notes (Signed)
@Patient  ID: Luis Townsend, male    DOB: 07-05-1928, 84 y.o.   MRN: [id number]  Chief Complaint  Patient presents with  . Follow-up    Referring provider: Dorothyann Peng, NP  HPI: 84 yo male former smoker followed for dyspnea /COPD GOLD   TEST  PFT's  07/09/11  FEV1 1.5 (69 % ) ratio 51  p 21 % improvement from saba p ? prior to study with DLCO  64/65c % corrects to 74 % for alv volume     04/30/2017 Follow up : COPD  Pt returns for follow up . Says that he continues to have sob with activity . Gets winded with walking . Seems to wax and wane , sometimes he gets more  Dyspnea with a brisk walk .  Has minimal dry cough . Non productive. No hemoptysis .  No chest pain .  Symbicort helps with dyspnea.  Spirometry today shows a drop in lung function with FEV1 at 35%, ratio 47, FVC 51% Says he is very active .   No Known Allergies  Immunization History  Administered Date(s) Administered  . Influenza, High Dose Seasonal PF 12/08/2016  . Influenza-Unspecified 11/11/2014, 11/20/2015  . Pneumococcal Conjugate-13 12/26/2016  . Pneumococcal Polysaccharide-23 12/14/2015  . Pneumococcal-Unspecified 11/28/2012    Past Medical History:  Diagnosis Date  . Amaurosis fugax of left eye 10/11/1996  . Aortic stenosis 06/30/2011   severe  . Aortoiliac occlusive disease ([place]) 06/30/2011  . Cardiac resynchronization therapy pacemaker (CRT-P) in place    a. 11/29/15:  Medtronic model N5AO13 Percepta Quad CRT-P MRI SureScan (serial Number [id number] )  . Cerebrovascular disease 06/30/2011  . CHB (complete heart block) ([place]) 02/03/2013  . COPD (chronic obstructive pulmonary disease), moderat to severe 07/29/2011  . Coronary artery disease    a. 10/29/15 Cath for decreased EF with patent 2/3 grafts, no change   . Hyperlipidemia   . Hypertension   . Myocardial infarction ([place]) 03/22/1994   acute LAD occlusion  . PAD (peripheral artery disease) ([place])   . RBBB (right bundle branch block)   . S/P  CABG x 5 03/28/1994   LIMA to LAD, sequential SVG to OM1-OM2-OM4, SVG to RCA, open vein harvest right leg - Dr Redmond Pulling  . S/P carotid endarterectomy 10/28/1996   Dr Donnetta Hutching  . Subclavian artery stenosis, left ([place]) 06/30/2011  . Tobacco abuse     Tobacco History: Social History   Tobacco Use  Smoking Status Former Smoker  . Packs/day: 1.00  . Years: 60.00  . Pack years: 60.00  . Types: Cigarettes  . Last attempt to quit: 03/02/2011  . Years since quitting: 6.1  Smokeless Tobacco Never Used   Counseling given: Not Answered   Outpatient Encounter Medications as of 04/30/2017  Medication Sig  . albuterol (PROVENTIL HFA;VENTOLIN HFA) 108 (90 Base) MCG/ACT inhaler INHALE 2 PUFFS BY MOUTH EVERY 4 HOURS AS NEEDED  . aspirin EC 81 MG tablet Take 81 mg by mouth daily.  . budesonide-formoterol (SYMBICORT) 80-4.5 MCG/ACT inhaler Take 2 puffs first thing in am and then another 2 puffs about 12 hours later.  . carvedilol (COREG) 6.25 MG tablet TAKE 1 TABLET BY MOUTH TWICE A DAY WITH A MEAL  . furosemide (LASIX) 20 MG tablet Take 1 tablet by mouth daily.  . irbesartan (AVAPRO) 75 MG tablet Take 1 tablet (75 mg total) daily by mouth.  . rosuvastatin (CRESTOR) 40 MG tablet Take 1 tablet (40 mg total) by mouth daily.  . Tiotropium Bromide  Monohydrate (SPIRIVA RESPIMAT) 2.5 MCG/ACT AERS Inhale 2 puffs into the lungs daily.  . Tiotropium Bromide Monohydrate (SPIRIVA RESPIMAT) 2.5 MCG/ACT AERS Inhale 2 puffs into the lungs daily.   No facility-administered encounter medications on file as of 04/30/2017.      Review of Systems  Constitutional:   No  weight loss, night sweats,  Fevers, chills, fatigue, or  lassitude.  HEENT:   No headaches,  Difficulty swallowing,  Tooth/dental problems, or  Sore throat,                No sneezing, itching, ear ache, nasal congestion, post nasal drip,   CV:  No chest pain,  Orthopnea, PND, swelling in lower extremities, anasarca, dizziness, palpitations, syncope.    GI  No heartburn, indigestion, abdominal pain, nausea, vomiting, diarrhea, change in bowel habits, loss of appetite, bloody stools.   Resp:  .  No chest wall deformity  Skin: no rash or lesions.  GU: no dysuria, change in color of urine, no urgency or frequency.  No flank pain, no hematuria   MS:  No joint pain or swelling.  No decreased range of motion.  No back pain.    Physical Exam  BP 114/68 (BP Location: Left Arm, Cuff Size: Normal)   Pulse 90   Ht 5\' 6"  (1.676 m)   Wt 147 lb 6.4 oz (66.9 kg)   SpO2 94%   BMI 23.79 kg/m   GEN: A/Ox3; pleasant , NAD, elderly    HEENT:  [place]/AT,  EACs-clear, TMs-wnl, NOSE-clear, THROAT-clear, no lesions, no postnasal drip or exudate noted.   NECK:  Supple w/ fair ROM; no JVD; normal carotid impulses w/o bruits; no thyromegaly or nodules palpated; no lymphadenopathy.    RESP  Clear  P & A; w/o, wheezes/ rales/ or rhonchi. no accessory muscle use, no dullness to percussion  CARD:  RRR, no m/r/g, no peripheral edema, pulses intact, no cyanosis or clubbing.  GI:   Soft & nt; nml bowel sounds; no organomegaly or masses detected.   Musco: Warm bil, no deformities or joint swelling noted.   Neuro: alert, no focal deficits noted.    Skin: Warm, no lesions or rashes    Lab Results:  CBC  BNP No results found for: BNP   Imaging: Dg Chest 2 View  Result Date: 04/30/2017 CLINICAL DATA:  COPD EXAM: CHEST - 2 VIEW COMPARISON:  05/30/2016 FINDINGS: Post sternotomy changes. Left-sided pacing device as before. Valve replacement. Hyperinflation with emphysematous disease. Aortic atherosclerosis. Hyperinflation with emphysema. No acute infiltrate or effusion. Similar appearance of mid and lower thoracic compression fractures. Sclerotic lesion proximal left humerus also unchanged. IMPRESSION: No active cardiopulmonary disease. Hyperinflation with emphysematous disease. Electronically Signed   By: Donavan Foil M.D.   On: 04/30/2017 17:01      Assessment & Plan:   COPD GOLD II Progressive COPD with drop in FEV1 over last 5 yrs.  Add LAMA  Check cxr and labs   Plan  Patient Instructions  Continue on Symbicort Twice daily , rinse after use  Begin Spiriva 2 puffs daily, rinse after use.  Chest x-ray today Labs today .  Follow up with Dr. Melvyn Novas  In 6 weeks and As needed   Please contact office for sooner follow up if symptoms do not improve or worsen or seek emergency care          Rexene Edison, NP 04/30/2017

## 2017-04-30 NOTE — Patient Instructions (Addendum)
Continue on Symbicort Twice daily , rinse after use  Begin Spiriva 2 puffs daily, rinse after use.  Chest x-ray today Labs today .  Follow up with Dr. Melvyn Novas  In 6 weeks and As needed   Please contact office for sooner follow up if symptoms do not improve or worsen or seek emergency care

## 2017-05-01 LAB — BASIC METABOLIC PANEL
BUN: 29 mg/dL — ABNORMAL HIGH (ref 6–23)
CALCIUM: 9 mg/dL (ref 8.4–10.5)
CO2: 28 mEq/L (ref 19–32)
Chloride: 105 mEq/L (ref 96–112)
Creatinine, Ser: 1.19 mg/dL (ref 0.40–1.50)
GFR: 61.44 mL/min (ref >60.00)
GLUCOSE: 101 mg/dL — AB (ref 70–99)
Potassium: 5.1 mEq/L (ref 3.5–5.1)
SODIUM: 140 meq/L (ref 135–145)

## 2017-05-01 NOTE — Progress Notes (Signed)
Chart and office note reviewed in detail  > agree with a/p as outlined    

## 2017-05-04 ENCOUNTER — Telehealth: Payer: Self-pay | Admitting: Internal Medicine

## 2017-05-04 NOTE — Telephone Encounter (Signed)
lmtcb x1 for pt. 

## 2017-05-04 NOTE — Telephone Encounter (Signed)
If can check with his insurance for formulary , we can look to see if something is more affordable or can change to nebs with Atrovent Four times a day  .

## 2017-05-04 NOTE — Telephone Encounter (Signed)
He wants to know if he is to eliminate any of the inhalers he is currently on and what to do about the new RX that is too expensive.  Pharm is [facility] [address].

## 2017-05-04 NOTE — Telephone Encounter (Signed)
Spoke with patient. He was seen by TP last week and was advised to start Spiriva in addition to his Symbicort 80. Patient went to pick this medication at the pharmacy and it was well over $100.   Patient stated that he was advised by TP last week to call back if the price of Spiriva was too much.   [facility] [address] [facility]   TP, please advise. Thanks!

## 2017-05-05 NOTE — Telephone Encounter (Signed)
lmtcb x2 for pt. 

## 2017-05-06 NOTE — Telephone Encounter (Signed)
Left message for patient to call back  

## 2017-05-07 NOTE — Telephone Encounter (Signed)
Called and spoke with pt asking him to call his insurance company and get a formulary list so we can be able to see which meds will be more affordable for him.  Also stated to pt we could change his nebs to atrovent so he can take that four times a day.  Pt expressed understanding and stated to me he would call us back to let us know which way he will go.  Will leave encounter open until everything has been fully taken care of for pt.

## 2017-05-08 NOTE — Telephone Encounter (Signed)
Provide sample of spiriva if we have it Ov with me with formulary and all meds in hand to regroup

## 2017-05-08 NOTE — Telephone Encounter (Signed)
Attempted to call pt but no answer.   Left message for pt to return our call x1 

## 2017-05-08 NOTE — Telephone Encounter (Signed)
Patient returning call, CB [phone]

## 2017-05-08 NOTE — Telephone Encounter (Signed)
Patient returning call.  Per insurance company another option is Atrovent - price is only $5 cheaper than Spiriva that was prescribed. Pharm is [facility] [address].  Patient's CB is [phone].

## 2017-05-08 NOTE — Telephone Encounter (Signed)
Called patient unable to reach left message to call us bacl

## 2017-05-08 NOTE — Telephone Encounter (Signed)
Called and spoke with pt who stated he called his insurance company to see if they could tell him meds that was more affordable for him other than Spiriva and pt stated another option insurance company told him was Atrovent, which was only $5 cheaper than Spiriva.  Pt stated the Spiriva would cost him well over $100, and the Atrovent would not be much different in price.  Dr. Melvyn Novas, any recommendations on a different inhaler for pt other than Spiriva that might be more affordable for pt?

## 2017-05-11 MED ORDER — TIOTROPIUM BROMIDE MONOHYDRATE 2.5 MCG/ACT IN AERS: 2.0000 | INHALATION_SPRAY | Freq: Every day | RESPIRATORY_TRACT | 0 refills | Status: DC

## 2017-05-11 NOTE — Telephone Encounter (Signed)
Called and spoke with patient regarding Dr. Gustavus Bryant recommendation. Scheduled office visit and asked patient to bring all medications and the formulary with him, reminded him of this several times during the phone call. OV scheduled for 4/16 at 1:45pm. Sample of Spiriva being left at the front for patient to pick up. Patient stated he will be coming to get sample. Nothing further is currently needed.

## 2017-05-21 ENCOUNTER — Other Ambulatory Visit: Payer: Self-pay | Admitting: Adult Health

## 2017-05-21 DIAGNOSIS — H25812 Combined forms of age-related cataract, left eye: Secondary | ICD-10-CM | POA: Diagnosis not present

## 2017-05-21 DIAGNOSIS — Z961 Presence of intraocular lens: Secondary | ICD-10-CM | POA: Diagnosis not present

## 2017-05-21 DIAGNOSIS — H35352 Cystoid macular degeneration, left eye: Secondary | ICD-10-CM | POA: Diagnosis not present

## 2017-05-21 DIAGNOSIS — H353133 Nonexudative age-related macular degeneration, bilateral, advanced atrophic without subfoveal involvement: Secondary | ICD-10-CM | POA: Diagnosis not present

## 2017-05-21 DIAGNOSIS — H26491 Other secondary cataract, right eye: Secondary | ICD-10-CM | POA: Diagnosis not present

## 2017-05-21 DIAGNOSIS — H348122 Central retinal vein occlusion, left eye, stable: Secondary | ICD-10-CM | POA: Diagnosis not present

## 2017-05-26 ENCOUNTER — Ambulatory Visit (INDEPENDENT_AMBULATORY_CARE_PROVIDER_SITE_OTHER): Payer: Medicare Other | Admitting: Internal Medicine

## 2017-05-26 ENCOUNTER — Encounter: Payer: Self-pay | Admitting: Internal Medicine

## 2017-05-26 ENCOUNTER — Other Ambulatory Visit: Payer: Self-pay

## 2017-05-26 VITALS — BP 118/72 | HR 87 | Ht 66.0 in | Wt 147.6 lb

## 2017-05-26 DIAGNOSIS — J449 Chronic obstructive pulmonary disease, unspecified: Secondary | ICD-10-CM

## 2017-05-26 NOTE — Progress Notes (Signed)
Subjective:     Patient ID: Luis Townsend, male   DOB: [birth year],    MRN: [id number]     Brief patient profile:  53 yowm quit smoking  02/2011 no problem with sob/cough and new cough/sob started around 2015 and worse since  fall 2017 referred to pulmonary clinic 05/30/2016 by Dorothyann Peng  With ? Copd    05/30/2016 1st [facility] office visit/ Mikhail Hallenbeck  On ACEi / symbicort  Chief Complaint  Patient presents with  . Pulmonary Consult    Referred by Dorothyann Peng. Pt c/o SOB for the past 2-3 yrs, worse since Jan 2018. He reports he gets SOB walking a few hundred ft, for example waslking to his mailbox. He does fine at rest. He has also had cough that comes and goes. Cough is occ prod with clear sputum. He finds it difficult to produce any sputum lately.   acutely ill in fall 2017 uri/pna and never completely recovered with persistent moderatedly severe daily cough min production and much worse doe = MMRC3 = can't walk 100 yards even at a slow pace at a flat grade s stopping due to sob   rec Plan A = Automatic =  Symbicort 160  Take 2 puffs first thing in am and then another 2 puffs about 12 hours later.  Work on inhaler technique:   Plan B = Backup Only use your albuterol (ventolin)  stop altace and start avapro 150 mg one daily - break in half if too strong     07/11/2016  f/u ov/Ewing Fandino re:  COPD  GOLD II / symb 160 2bid / off acei x 6 weeks  Chief Complaint  Patient presents with  . Follow-up    Breathing has improved and he is coughing less. He is using ventolin hfa 3-4 x per wk on average.     Less albuterol now, sleeping fine s am exac  Doe = MMRC2 = can't walk a nl pace on a flat grade s sob but does fine slow and flat  rec When you finish the present symbicort 160 change to 80 2 pffs every 12 hours  Work on inhaler technique:  Remember to pace yourself    04/30/17  Continue on Symbicort Twice daily , rinse after use  Begin Spiriva 2 puffs daily, rinse after use.  Chest x-ray  today    05/26/2017  f/u ov/Clora Ohmer re:  COPD II vs III only a little better p added spiriva but can't afford it/ suboptimal hfa  Chief Complaint  Patient presents with  . Follow-up    Breathing has only improved minimally. He has occ dry cough.  He is using his albuterol inhaler once daily on average.    Dyspnea:  Mailbox and back is 200 ft and stops =  MMRC3 = can't walk 100 yards even at a slow pace at a flat grade s stopping due to sob   Cough: no Sleep: ok  SABA use:  Once in pm   No obvious day to day or daytime variability or assoc excess/ purulent sputum or mucus plugs or hemoptysis or cp or chest tightness, subjective wheeze or overt sinus or hb symptoms. No unusual exposure hx or h/o childhood pna/ asthma or knowledge of premature birth.  Sleeping  Flat   without nocturnal  or early am exacerbation  of respiratory  c/o's or need for noct saba. Also denies any obvious fluctuation of symptoms with weather or environmental changes or other aggravating or alleviating factors  except as outlined above   Current Allergies, Complete Past Medical History, Past Surgical History, Family History, and Social History were reviewed in [employer] record.  ROS  The following are not active complaints unless bolded Hoarseness, sore throat, dysphagia, dental problems, itching, sneezing,  nasal congestion or discharge of excess mucus or purulent secretions, ear ache,   fever, chills, sweats, unintended wt loss or wt gain, classically pleuritic or exertional cp,  orthopnea pnd or arm/hand swelling  or leg swelling, presyncope, palpitations, abdominal pain, anorexia, nausea, vomiting, diarrhea  or change in bowel habits or change in bladder habits, change in stools or change in urine, dysuria, hematuria,  rash, arthralgias, visual complaints "getting weaker with old age", headache, numbness, weakness or ataxia or problems with walking or coordination,  change in mood or  memory.         Current Meds  Medication Sig  . aspirin EC 81 MG tablet Take 81 mg by mouth daily.  . budesonide-formoterol (SYMBICORT) 80-4.5 MCG/ACT inhaler Take 2 puffs first thing in am and then another 2 puffs about 12 hours later.  . carvedilol (COREG) 6.25 MG tablet TAKE 1 TABLET BY MOUTH TWICE A DAY WITH A MEAL  . furosemide (LASIX) 20 MG tablet Take 1 tablet by mouth daily.  . irbesartan (AVAPRO) 75 MG tablet Take 1 tablet (75 mg total) daily by mouth.  . rosuvastatin (CRESTOR) 40 MG tablet Take 1 tablet (40 mg total) by mouth daily.  . Tiotropium Bromide Monohydrate (SPIRIVA RESPIMAT) 2.5 MCG/ACT AERS Inhale 2 puffs into the lungs daily.  . VENTOLIN HFA 108 (90 Base) MCG/ACT inhaler TAKE 2 PUFFS BY MOUTH EVERY 4 HOURS AS NEEDED                   Objective:   Physical Exam    amb wm nad    05/26/2017       147   07/11/2016        142   05/30/16 143 lb (64.9 kg)  04/29/16 147 lb (66.7 kg)  04/15/16 148 lb (67.1 kg)      Vital signs reviewed - Note on arrival 02 sats  94% on RA       HEENT: nl dentition / oropharynx. Nl external ear canals without cough reflex - mild bilateral non-specific turbinate edema     NECK :  without JVD/Nodes/TM/ nl carotid upstrokes bilaterally   LUNGS: no acc muscle use,  Mod barrel  contour chest wall with bilateral  Distant bs s audible wheeze and  without cough on insp or exp maneuver and mod   Hyperresonant  to  percussion bilaterally     CV:  RRR  no s3 or murmur or increase in P2, and no edema   ABD:  soft and nontender with pos mid insp Hoover's  in the supine position. No bruits or organomegaly appreciated, bowel sounds nl  MS:   Nl gait/  ext warm without deformities, calf tenderness, cyanosis or clubbing No obvious joint restrictions   SKIN: warm and dry without lesions    NEURO:  alert, approp, nl sensorium with  no motor or cerebellar deficits apparent.          I personally reviewed images and agree with radiology  impression as follows:  CXR:   04/30/17 No active cardiopulmonary disease. Hyperinflation with emphysematous disease.            Assessment:

## 2017-05-26 NOTE — Patient Instructions (Addendum)
Plan A = Automatic = Symbicort 160 Take 2 puffs first thing in am and then another 2 puffs about 12 hours later.    Work on inhaler technique:  relax and gently blow all the way out then take a nice smooth deep breath back in, triggering the inhaler at same time you start breathing in.  Hold for up to 5 seconds if you can. Blow out thru nose. Rinse and gargle with water when done      Plan B = Backup Only use your albuterol as a rescue medication to be used if you can't catch your breath by resting or doing a relaxed purse lip breathing pattern.  - The less you use it, the better it will work when you need it. - Ok to use the inhaler up to 2 puffs  every 4 hours if you must but call for appointment if use goes up over your usual need - Don't leave home without it !!  (think of it like the spare tire for your car)    Please schedule a follow up office visit in 4 weeks, sooner if needed  with all medications /inhalers/ solutions in hand so we can verify exactly what you are taking. This includes all medications from all doctors and over the counters  with PFTs on return  -   need formulary list of alternatives for your inhalers from your insurance company    Add: notify pt he should be on the 160 2bid rx and do alpha one screening to be complete

## 2017-05-27 ENCOUNTER — Telehealth: Payer: Self-pay | Admitting: *Deleted

## 2017-05-27 ENCOUNTER — Encounter: Payer: Self-pay | Admitting: Internal Medicine

## 2017-05-27 NOTE — Telephone Encounter (Signed)
lmtcb

## 2017-05-27 NOTE — Telephone Encounter (Signed)
Pt is returning call. Cb is [phone].

## 2017-05-27 NOTE — Telephone Encounter (Signed)
-----   Message from Tanda Rockers, MD sent at 05/27/2017 10:20 AM EDT ----- otify pt he should be on the 160 2bid rx (he can finish up the 80's he has but then shift to the 160)

## 2017-05-27 NOTE — Assessment & Plan Note (Addendum)
Quit smoking 2013 - PFT's  07/09/11  FEV1 1.5 (69 % ) ratio 51  p 21 % improvement from saba p ? prior to study with DLCO  64/65c % corrects to 74 % for alv volume   - 05/30/2016    try change from advair to symbicort 160 - Spirometry 05/08/2017  FEV1 0.73 (35%)  Ratio 47 ? p am symb 160 > added spiriva    - 05/26/2017  After extensive coaching inhaler device  effectiveness =    75% from a baseline of < 50%  Not clear why so much worse  Over last 6 y p quit smoking though may have an asthmatic component that isn't being addressed due to poor hfa so should start there and consider lama/laba if proves not to have an asthmatic component over time - in this regard he should be on the symb 160 not the 80) and caution with use of BB (coreg in low doses is probably ok for now but may need to revisit and check alpha one phenotype as well to be complete   In meantime:  Formulary restrictions will be an ongoing challenge for the forseable future and I would be happy to pick an alternative if the pt will first  provide me a list of them but pt  will need to return here for training for any new device that is required eg dpi vs hfa vs respimat.    In meantime we can always provide samples so the patient never runs out of any needed respiratory medications.    I had an extended discussion with the patient reviewing all relevant studies completed to date and  lasting 15 to 20 minutes of a 25 minute visit    Each maintenance medication was reviewed in detail including most importantly the difference between maintenance and prns and under what circumstances the prns are to be triggered using an action plan format that is not reflected in the computer generated alphabetically organized AVS.    Please see AVS for specific instructions unique to this visit that I personally wrote and verbalized to the the pt in detail and then reviewed with pt  by my nurse highlighting any  changes in therapy recommended at today's visit  to their plan of care.

## 2017-05-29 ENCOUNTER — Other Ambulatory Visit: Payer: Self-pay | Admitting: Adult Health

## 2017-05-30 DIAGNOSIS — Z760 Encounter for issue of repeat prescription: Secondary | ICD-10-CM | POA: Diagnosis not present

## 2017-05-30 DIAGNOSIS — J4520 Mild intermittent asthma, uncomplicated: Secondary | ICD-10-CM | POA: Diagnosis not present

## 2017-06-02 NOTE — Telephone Encounter (Signed)
Duplicate request.  Has already been sent in

## 2017-06-03 MED ORDER — BUDESONIDE-FORMOTEROL FUMARATE 160-4.5 MCG/ACT IN AERO: 2.0000 | INHALATION_SPRAY | Freq: Two times a day (BID) | RESPIRATORY_TRACT | 11 refills | Status: DC

## 2017-06-03 NOTE — Telephone Encounter (Signed)
Called and spoke with Horris Latino, pt's spouse (on [place]) and notified of recs per MW  She verbalized understanding and will inform the pt  I have sent in rx for the 160 mg symbicort

## 2017-06-11 ENCOUNTER — Ambulatory Visit: Payer: Medicare Other | Admitting: Internal Medicine

## 2017-06-11 DIAGNOSIS — H35352 Cystoid macular degeneration, left eye: Secondary | ICD-10-CM | POA: Diagnosis not present

## 2017-06-11 DIAGNOSIS — H34812 Central retinal vein occlusion, left eye, with macular edema: Secondary | ICD-10-CM | POA: Diagnosis not present

## 2017-06-11 DIAGNOSIS — H2512 Age-related nuclear cataract, left eye: Secondary | ICD-10-CM | POA: Diagnosis not present

## 2017-06-11 DIAGNOSIS — H43822 Vitreomacular adhesion, left eye: Secondary | ICD-10-CM | POA: Diagnosis not present

## 2017-06-11 DIAGNOSIS — H35032 Hypertensive retinopathy, left eye: Secondary | ICD-10-CM | POA: Diagnosis not present

## 2017-06-18 ENCOUNTER — Other Ambulatory Visit: Payer: Self-pay | Admitting: Cardiovascular Disease

## 2017-06-19 NOTE — Telephone Encounter (Signed)
It is indicated on the refill request that this med was discontinued by the provider 11/22/15. I spoke with the patient and he stated that he is still taking 20 mg qd. Pharmacy sent refill request to Dr Croitoru's office but they forwarded it here. Patient states that he still has several doses left on hand. Okay to refill? Please advise. Thanks, MI

## 2017-06-22 NOTE — Telephone Encounter (Signed)
I am not sure why this medication was d/c, by me 11/2015, it may have been held temporarily d/t abn kidney function.  Will forward back to NL office to address.  Would ask that general cardiologist, Dr. Sallyanne Kuster, advise if patient should be taking daily Lasix.

## 2017-06-22 NOTE — Telephone Encounter (Signed)
Rx(s) sent to pharmacy electronically.  

## 2017-07-02 ENCOUNTER — Ambulatory Visit (INDEPENDENT_AMBULATORY_CARE_PROVIDER_SITE_OTHER): Payer: Medicare Other | Admitting: Internal Medicine

## 2017-07-02 ENCOUNTER — Other Ambulatory Visit: Payer: Medicare Other

## 2017-07-02 ENCOUNTER — Encounter: Payer: Self-pay | Admitting: Internal Medicine

## 2017-07-02 VITALS — BP 124/64 | HR 73 | Ht 65.0 in | Wt 146.0 lb

## 2017-07-02 DIAGNOSIS — J449 Chronic obstructive pulmonary disease, unspecified: Secondary | ICD-10-CM

## 2017-07-02 LAB — PULMONARY FUNCTION TEST
DL/VA % PRED: 50 %
DL/VA: 2.11 ml/min/mmHg/L
DLCO COR: 8 ml/min/mmHg
DLCO cor % pred: 31 %
DLCO unc % pred: 30 %
DLCO unc: 7.9 ml/min/mmHg
FEF 25-75 Post: 0.39 L/sec
FEF 25-75 Pre: 0.25 L/sec
FEF2575-%CHANGE-POST: 59 %
FEF2575-%PRED-PRE: 21 %
FEF2575-%Pred-Post: 33 %
FEV1-%Change-Post: 21 %
FEV1-%PRED-PRE: 36 %
FEV1-%Pred-Post: 44 %
FEV1-POST: 0.87 L
FEV1-PRE: 0.71 L
FEV1FVC-%CHANGE-POST: 5 %
FEV1FVC-%PRED-PRE: 54 %
FEV6-%Change-Post: 19 %
FEV6-%PRED-PRE: 63 %
FEV6-%Pred-Post: 75 %
FEV6-Post: 1.99 L
FEV6-Pre: 1.66 L
FEV6FVC-%Change-Post: 4 %
FEV6FVC-%PRED-POST: 101 %
FEV6FVC-%PRED-PRE: 97 %
FVC-%CHANGE-POST: 14 %
FVC-%PRED-POST: 74 %
FVC-%PRED-PRE: 64 %
FVC-POST: 2.15 L
FVC-PRE: 1.88 L
POST FEV1/FVC RATIO: 40 %
POST FEV6/FVC RATIO: 93 %
PRE FEV1/FVC RATIO: 38 %
Pre FEV6/FVC Ratio: 89 %
RV % PRED: 167 %
RV: 4.23 L
TLC % PRED: 109 %
TLC: 6.64 L

## 2017-07-02 MED ORDER — TIOTROPIUM BROMIDE-OLODATEROL 2.5-2.5 MCG/ACT IN AERS: 2.0000 | INHALATION_SPRAY | Freq: Every day | RESPIRATORY_TRACT | 0 refills | Status: DC

## 2017-07-02 MED ORDER — TIOTROPIUM BROMIDE MONOHYDRATE 2.5 MCG/ACT IN AERS: 2.0000 | INHALATION_SPRAY | Freq: Every day | RESPIRATORY_TRACT | 11 refills | Status: DC

## 2017-07-02 MED ORDER — TIOTROPIUM BROMIDE-OLODATEROL 2.5-2.5 MCG/ACT IN AERS: 2.0000 | INHALATION_SPRAY | Freq: Every day | RESPIRATORY_TRACT | 11 refills | Status: DC

## 2017-07-02 NOTE — Assessment & Plan Note (Signed)
Quit smoking 2013 - PFT's  07/09/11  FEV1 1.5 (69 % ) ratio 51  p 21 % improvement from saba p ? prior to study with DLCO  64/65c % corrects to 74 % for alv volume   - 05/30/2016    try change from advair to symbicort 160 - Spirometry 05/08/2017  FEV1 0.73 (35%)  Ratio 47 ? p am symb 160 > added spiriva   - 05/26/2017  After extensive coaching inhaler device  effectiveness =    75% from a baseline of < 50%    - PFT's  07/02/2017  FEV1 0.87 (44 % ) ratio 40  p 21 % improvement from saba p 30/31 prior to study with DLCO  30/31 % corrects to 50 % for alv volume   - 07/02/2017  After extensive coaching inhaler device  effectiveness =   50% hfa/  90% smi so changed to stiolto 2 pffs each am  - alpha one screening sent   Pt is Group B in terms of symptom/risk and laba/lama therefore appropriate rx at this point and does a lot better with smi than hfa so reasonable to try stiolto 2 each am on trial basis   I had an extended discussion with the patient reviewing all relevant studies completed to date and  lasting 15 to 20 minutes of a 25 minute visit    See device teaching which extended face to face time for this visit.  Each maintenance medication was reviewed in detail including emphasizing most importantly the difference between maintenance and prns and under what circumstances the prns are to be triggered using an action plan format that is not reflected in the computer generated alphabetically organized AVS which I have not found useful in most complex patients, especially with respiratory illnesses  Please see AVS for specific instructions unique to this visit that I personally wrote and verbalized to the the pt in detail and then reviewed with pt  by my nurse highlighting any  changes in therapy recommended at today's visit to their plan of care.

## 2017-07-02 NOTE — Progress Notes (Signed)
Subjective:     Patient ID: Luis Townsend, male   DOB: 1928/11/15,    MRN: [id number]     Brief patient profile:  49 yowm quit smoking  02/2011 no problem with sob/cough and new cough/sob started around 2015 and worse since  fall 2017 referred to pulmonary clinic 05/30/2016 by Dorothyann Peng  With ? Copd     .History of Present Illness  05/30/2016 1st [facility] office visit/ Laiylah Roettger  On ACEi / symbicort  Chief Complaint  Patient presents with  . Pulmonary Consult    Referred by Dorothyann Peng. Pt c/o SOB for the past 2-3 yrs, worse since Jan 2018. He reports he gets SOB walking a few hundred ft, for example waslking to his mailbox. He does fine at rest. He has also had cough that comes and goes. Cough is occ prod with clear sputum. He finds it difficult to produce any sputum lately.   acutely ill in fall 2017 uri/pna and never completely recovered with persistent moderatedly severe daily cough min production and much worse doe = MMRC3 = can't walk 100 yards even at a slow pace at a flat grade s stopping due to sob   rec Plan A = Automatic =  Symbicort 160  Take 2 puffs first thing in am and then another 2 puffs about 12 hours later.  Work on inhaler technique:   Plan B = Backup Only use your albuterol (ventolin)  stop altace and start avapro 150 mg one daily - break in half if too strong     07/11/2016  f/u ov/Konnor Vondrasek re:  COPD  GOLD II / symb 160 2bid / off acei x 6 weeks  Chief Complaint  Patient presents with  . Follow-up    Breathing has improved and he is coughing less. He is using ventolin hfa 3-4 x per wk on average.     Less albuterol now, sleeping fine s am exac  Doe = MMRC2 = can't walk a nl pace on a flat grade s sob but does fine slow and flat  rec When you finish the present symbicort 160 change to 80 2 pffs every 12 hours  Work on inhaler technique:  Remember to pace yourself    04/30/17  Continue on Symbicort Twice daily , rinse after use  Begin Spiriva 2 puffs  daily, rinse after use.  Chest x-ray today    05/26/2017  f/u ov/Suhani Stillion re:  COPD II vs III only a little better p added spiriva but can't afford it/ suboptimal hfa  Chief Complaint  Patient presents with  . Follow-up    Breathing has only improved minimally. He has occ dry cough.  He is using his albuterol inhaler once daily on average.    Dyspnea:  Mailbox and back is flat 200 ft and stops =  MMRC3 = can't walk 100 yards even at a slow pace at a flat grade s stopping due to sob   Cough: no Sleep: ok  SABA use:  Once in pm rec Plan A = Automatic = Symbicort 160 Take 2 puffs first thing in am and then another 2 puffs about 12 hours later.  Work on inhaler technique: Plan B = Backup Only use your albuterol as a rescue medication  do alpha one screening to be complete     07/02/2017  f/u ov/Harpreet Signore re: copd GOLD III  maint on symb/spiriva Chief Complaint  Patient presents with  . Follow-up    PFT's done today. Breathing  is doing some better. He is coughing less. He is producing some minimal clear sputum.     Dyspnea:  Mailbox slow but not stopping now Cough: no Sleep: ok  SABA use: p ex only   No obvious day to day or daytime variability or assoc excess/ purulent sputum or mucus plugs or hemoptysis or cp or chest tightness, subjective wheeze or overt sinus or hb symptoms. No unusual exposure hx or h/o childhood pna/ asthma or knowledge of premature birth.  Sleeping  Ok flat   without nocturnal  or early am exacerbation  of respiratory  c/o's or need for noct saba. Also denies any obvious fluctuation of symptoms with weather or environmental changes or other aggravating or alleviating factors except as outlined above   Current Allergies, Complete Past Medical History, Past Surgical History, Family History, and Social History were reviewed in [employer] record.  ROS  The following are not active complaints unless bolded Hoarseness, sore throat, dysphagia, dental  problems, itching, sneezing,  nasal congestion or discharge of excess mucus or purulent secretions, ear ache,   fever, chills, sweats, unintended wt loss or wt gain, classically pleuritic or exertional cp,  orthopnea pnd or arm/hand swelling  or leg swelling, presyncope, palpitations, abdominal pain, anorexia, nausea, vomiting, diarrhea  or change in bowel habits or change in bladder habits, change in stools or change in urine, dysuria, hematuria,  rash, arthralgias, visual complaints, headache, numbness, weakness or ataxia or problems with walking or coordination,  change in mood or  memory.        Current Meds  Medication Sig  . aspirin EC 81 MG tablet Take 81 mg by mouth daily.  . budesonide-formoterol (SYMBICORT) 160-4.5 MCG/ACT inhaler Inhale 2 puffs into the lungs 2 (two) times daily.  . carvedilol (COREG) 6.25 MG tablet TAKE 1 TABLET BY MOUTH TWICE A DAY WITH A MEAL  . furosemide (LASIX) 20 MG tablet TAKE 1 TABLET (20 MG TOTAL) BY MOUTH DAILY.  Marland Kitchen irbesartan (AVAPRO) 75 MG tablet Take 1 tablet (75 mg total) daily by mouth.  . rosuvastatin (CRESTOR) 40 MG tablet Take 1 tablet (40 mg total) by mouth daily.  . VENTOLIN HFA 108 (90 Base) MCG/ACT inhaler TAKE 2 PUFFS BY MOUTH EVERY 4 HOURS AS NEEDED  . [ Tiotropium Bromide Monohydrate (SPIRIVA RESPIMAT) 2.5 MCG/ACT AERS Inhale 2 puffs into the lungs daily.  .                  Objective:   Physical Exam    amb wm nad   07/02/2017       146  05/26/2017       147   07/11/2016        142   05/30/16 143 lb (64.9 kg)  04/29/16 147 lb (66.7 kg)  04/15/16 148 lb (67.1 kg)       Vital signs reviewed - Note on arrival 02 sats  95% on RA         HEENT: nl dentition / oropharynx. Nl external ear canals without cough reflex - mild bilateral non-specific turbinate edema     NECK :  without JVD/Nodes/TM/ nl carotid upstrokes bilaterally   LUNGS: no acc muscle use,  Mod barrel  contour chest wall with bilateral  Early exp rhonchi  bilaterally and  without cough on insp or exp maneuver and mod   Hyperresonant  to  percussion bilaterally     CV:  RRR  no s3 or murmur or  increase in P2, and no edema   ABD:  soft and nontender with pos mid insp Hoover's  in the supine position. No bruits or organomegaly appreciated, bowel sounds nl  MS:   Nl gait/  ext warm without deformities, calf tenderness, cyanosis or clubbing No obvious joint restrictions   SKIN: warm and dry without lesions    NEURO:  alert, approp, nl sensorium with  no motor or cerebellar deficits apparent.       Labs ordered 07/02/2017  Alpha one screening          Assessment:

## 2017-07-02 NOTE — Patient Instructions (Addendum)
Plan A = Automatic = stiolto 2 pffs each am in place symbicort/ spriva  Work on inhaler technique:  relax and gently blow all the way out then take a nice smooth deep breath back in, triggering the inhaler at same time you start breathing in.  Hold for up to 5 seconds if you can.   Rinse and gargle with water when done     Plan B = Backup Only use your albuterol as a rescue medication to be used if you can't catch your breath by resting or doing a relaxed purse lip breathing pattern.  - The less you use it, the better it will work when you need it. - Ok to use the inhaler up to 2 puffs  every 4 hours if you must but call for appointment if use goes up over your usual need - Don't leave home without it !!  (think of it like the spare tire for your car)    Please remember to go to the lab department downstairs in the basement  for your tests - we will call you with the results when they are available.      Please schedule a follow up visit in 3 months but call sooner if needed

## 2017-07-02 NOTE — Progress Notes (Signed)
PFT done today. 

## 2017-07-08 ENCOUNTER — Ambulatory Visit (INDEPENDENT_AMBULATORY_CARE_PROVIDER_SITE_OTHER): Payer: Medicare Other | Admitting: Cardiology

## 2017-07-08 ENCOUNTER — Encounter: Payer: Self-pay | Admitting: Cardiology

## 2017-07-08 VITALS — BP 150/82 | HR 78 | Ht 65.5 in | Wt 149.0 lb

## 2017-07-08 DIAGNOSIS — I519 Heart disease, unspecified: Secondary | ICD-10-CM

## 2017-07-08 DIAGNOSIS — Z950 Presence of cardiac pacemaker: Secondary | ICD-10-CM

## 2017-07-08 DIAGNOSIS — I350 Nonrheumatic aortic (valve) stenosis: Secondary | ICD-10-CM | POA: Diagnosis not present

## 2017-07-08 DIAGNOSIS — E782 Mixed hyperlipidemia: Secondary | ICD-10-CM | POA: Diagnosis not present

## 2017-07-08 DIAGNOSIS — I771 Stricture of artery: Secondary | ICD-10-CM

## 2017-07-08 DIAGNOSIS — I739 Peripheral vascular disease, unspecified: Secondary | ICD-10-CM

## 2017-07-08 DIAGNOSIS — Z952 Presence of prosthetic heart valve: Secondary | ICD-10-CM

## 2017-07-08 DIAGNOSIS — Z9889 Other specified postprocedural states: Secondary | ICD-10-CM | POA: Diagnosis not present

## 2017-07-08 DIAGNOSIS — J449 Chronic obstructive pulmonary disease, unspecified: Secondary | ICD-10-CM

## 2017-07-08 DIAGNOSIS — Z951 Presence of aortocoronary bypass graft: Secondary | ICD-10-CM

## 2017-07-08 LAB — ALPHA-1 ANTITRYPSIN PHENOTYPE: A1 ANTITRYPSIN SER: 167 mg/dL (ref 83–199)

## 2017-07-08 NOTE — Assessment & Plan Note (Signed)
Dr Melvyn Novas follows- he is not on home O2

## 2017-07-08 NOTE — Assessment & Plan Note (Signed)
LDL 196 Nov 2018 on Crestor 40 mg ?? Will re check this, consider PCSk9

## 2017-07-08 NOTE — Assessment & Plan Note (Signed)
Dr Donnetta Hutching

## 2017-07-08 NOTE — Patient Instructions (Addendum)
Medication Instructions: Your physician recommends that you continue on your current medications as directed. Please refer to the Current Medication list given to you today.  If you need a refill on your cardiac medications before your next appointment, please call your pharmacy.   Labwork: Your physician recommends that you return for lab work in: 1 week (CMP, lipid)   Procedures/Testing: Your physician has requested that you have an echocardiogram. Echocardiography is a painless test that uses sound waves to create images of your heart. It provides your doctor with information about the size and shape of your heart and how well your heart's chambers and valves are working. This procedure takes approximately one hour. There are no restrictions for this procedure. [place]: Your physician wants you to follow-up in 6 months with Dr. Sallyanne Kuster. You will receive a reminder letter in the mail two months in advance. If you don't receive a letter, please call our office at [phone] to schedule this follow-up appointment.   Special Instructions:    Thank you for choosing [facility] at [facility]!!

## 2017-07-08 NOTE — Assessment & Plan Note (Signed)
S/P multiple PTA- including LSCA stent

## 2017-07-08 NOTE — Assessment & Plan Note (Signed)
Sapien 26 mm , Oct 2013, [facility]

## 2017-07-08 NOTE — Progress Notes (Signed)
07/08/2017 Luis Townsend   [birth year]  [id number]  Primary Physician Carlisle Cater, [name], NP Primary Cardiologist: Dr Sallyanne Kuster- Dr Curt Bears EP  HPI:  Pleasant 84 y/o male, lives at home with his wife. He has a history of sinus bradycardia and complete heart block (pacemaker dependent), CAD S/P CABG 1995, AS S/P TAVR 2013, Cardiomyopathy- improved after upgrade to a CRT device Oct 2017, PAD with multiple vessel involvement (carotid stenosis S/P left carotid endarterectomy, S/P left subclavian stent), hyperlipidemia, hypertension and COPD, seen in the office today for follow up. He last saw dr Sallyanne Kuster in Jan 2018. He saw Dr Curt Bears for device check in Dec 2018. He has been doing well. He admits he is not as active as he used to be, he was a Scientist, forensic and covered Copywriter, advertising. He denies any unusual chest pain, he is always SOB but does not need home O2.    Current Outpatient Medications  Medication Sig Dispense Refill  . aspirin EC 81 MG tablet Take 81 mg by mouth daily.    . budesonide-formoterol (SYMBICORT) 160-4.5 MCG/ACT inhaler Inhale 2 puffs into the lungs 2 (two) times daily. 1 Inhaler 11  . carvedilol (COREG) 6.25 MG tablet TAKE 1 TABLET BY MOUTH TWICE A DAY WITH A MEAL 60 tablet 11  . furosemide (LASIX) 20 MG tablet TAKE 1 TABLET (20 MG TOTAL) BY MOUTH DAILY. 90 tablet 1  . irbesartan (AVAPRO) 75 MG tablet Take 1 tablet (75 mg total) daily by mouth. 90 tablet 1  . rosuvastatin (CRESTOR) 40 MG tablet Take 1 tablet (40 mg total) by mouth daily. 90 tablet 3  . Tiotropium Bromide-Olodaterol (STIOLTO RESPIMAT) 2.5-2.5 MCG/ACT AERS Inhale 2 puffs into the lungs daily. 1 Inhaler 11  . VENTOLIN HFA 108 (90 Base) MCG/ACT inhaler TAKE 2 PUFFS BY MOUTH EVERY 4 HOURS AS NEEDED 18 Inhaler 5   No current facility-administered medications for this visit.     No Known Allergies  Past Medical History:  Diagnosis Date  . Amaurosis fugax of left eye 10/11/1996  . Aortic stenosis 06/30/2011   severe  . Aortoiliac occlusive disease ([place]) 06/30/2011  . Cardiac resynchronization therapy pacemaker (CRT-P) in place    a. 11/29/15:  Medtronic model [id number] Percepta Quad CRT-P MRI SureScan (serial Number [id number] )  . Cerebrovascular disease 06/30/2011  . CHB (complete heart block) ([place]) 02/03/2013  . COPD (chronic obstructive pulmonary disease), moderat to severe 07/29/2011  . Coronary artery disease    a. 10/29/15 Cath for decreased EF with patent 2/3 grafts, no change   . Hyperlipidemia   . Hypertension   . Myocardial infarction ([place]) 03/22/1994   acute LAD occlusion  . PAD (peripheral artery disease) ([place])   . RBBB (right bundle branch block)   . S/P CABG x 5 03/28/1994   LIMA to LAD, sequential SVG to OM1-OM2-OM4, SVG to RCA, open vein harvest right leg - Dr Redmond Pulling  . S/P carotid endarterectomy 10/28/1996   Dr Donnetta Hutching  . Subclavian artery stenosis, left ([place]) 06/30/2011  . Tobacco abuse     Social History   Socioeconomic History  . Marital status: Married    Spouse name: Not on file  . Number of children: Not on file  . Years of education: Not on file  . Highest education level: Not on file  Occupational History  . Not on file  Social Needs  . Financial resource strain: Not on file  . Food insecurity:    Worry: Not on file  Inability: Not on file  . Transportation needs:    Medical: Not on file    Non-medical: Not on file  Tobacco Use  . Smoking status: Former Smoker    Packs/day: 1.00    Years: 60.00    Pack years: 60.00    Types: Cigarettes    Last attempt to quit: 03/02/2011    Years since quitting: 6.3  . Smokeless tobacco: Never Used  Substance and Sexual Activity  . Alcohol use: No    Alcohol/week: 0.0 oz  . Drug use: No  . Sexual activity: Not on file  Lifestyle  . Physical activity:    Days per week: Not on file    Minutes per session: Not on file  . Stress: Not on file  Relationships  . Social connections:    Talks on phone: Not on file     Gets together: Not on file    Attends religious service: Not on file    Active member of club or organization: Not on file    Attends meetings of clubs or organizations: Not on file    Relationship status: Not on file  . Intimate partner violence:    Fear of current or ex partner: Not on file    Emotionally abused: Not on file    Physically abused: Not on file    Forced sexual activity: Not on file  Other Topics Concern  . Not on file  Social History Narrative   Married for 63 years   Three children -    1. Son in [place]   2. Daughter in [place]   3.Son in [place]      Two grandchildren         Family History  Problem Relation Age of Onset  . Stroke Mother   . Heart attack Father   . Heart disease Father   . Pneumonia Maternal Grandmother   . Diabetes Maternal Grandfather   . Heart attack Paternal Grandmother      Review of Systems: General: negative for chills, fever, night sweats or weight changes.  Cardiovascular: negative for chest pain, dyspnea on exertion, edema, orthopnea, palpitations, paroxysmal nocturnal dyspnea or shortness of breath Dermatological: negative for rash Respiratory: negative for cough or wheezing Urologic: negative for hematuria Abdominal: negative for nausea, vomiting, diarrhea, bright red blood per rectum, melena, or hematemesis Neurologic: negative for visual changes, syncope, or dizziness All other systems reviewed and are otherwise negative except as noted above.    Blood pressure (!) 150/82, pulse 78, height 5' 5.5" (1.664 m), weight 149 lb (67.6 kg).  General appearance: alert, cooperative and no distress Neck: no JVD Lungs: clear to auscultation bilaterally Heart: regular rate and rhythm and 2/6 systolic murmur Extremities: extremities normal, atraumatic, no cyanosis or edema Pulses: 2+ and symmetric Skin: Skin color, texture, turgor normal. No rashes or lesions Neurologic: Grossly normal  EKG Paced  ASSESSMENT AND PLAN:   S/P CABG x  5, stenosis to RCA VG 1995 LIMA to LAD, sequential SVG to OM1-OM2-OM4, SVG to RCA, open vein harvest right leg - Dr Redmond Pulling  Left ventricular systolic dysfunction EF 30% Aug 2017- improved to 55% (echo Feb 2018) after CRT upgrade Oct 2017  Pacemaker - Medtronic CRT-P upgrade 2017 Pacemaker dependent  Aortic stenosis, critical AS with TAVR 11/2011 Sapien 26 mm , Oct 2013, [facility]  Hyperlipidemia LDL 196 Nov 2018 on Crestor 40 mg ?? Will re check this, consider PCSk9  PAD (peripheral artery disease) ([place]) S/P multiple PTA- including  LSCA stent  S/P carotid endarterectomy Dr Donnetta Hutching  COPD GOLD II vs III Dr Melvyn Novas follows- he is not on home O2   PLAN  Check echo, check CMET, fasting lipids. F/U Dr Sallyanne Kuster in 6 months.   Kerin Ransom PA-C 07/08/2017 3:00 PM

## 2017-07-08 NOTE — Assessment & Plan Note (Signed)
EF 35% Aug 2017- improved to 55% (echo Feb 2018) after CRT upgrade Oct 2017

## 2017-07-08 NOTE — Assessment & Plan Note (Signed)
Pacemaker dependent

## 2017-07-08 NOTE — Assessment & Plan Note (Signed)
LIMA to LAD, sequential SVG to OM1-OM2-OM4, SVG to RCA, open vein harvest right leg - Dr Redmond Pulling

## 2017-07-09 NOTE — Progress Notes (Signed)
Will wait for echo MCr

## 2017-07-09 NOTE — Progress Notes (Signed)
Spoke with pt and notified of results per Dr. Wert. Pt verbalized understanding and denied any questions. 

## 2017-07-10 DIAGNOSIS — I739 Peripheral vascular disease, unspecified: Secondary | ICD-10-CM | POA: Diagnosis not present

## 2017-07-10 DIAGNOSIS — E782 Mixed hyperlipidemia: Secondary | ICD-10-CM | POA: Diagnosis not present

## 2017-07-10 DIAGNOSIS — I350 Nonrheumatic aortic (valve) stenosis: Secondary | ICD-10-CM | POA: Diagnosis not present

## 2017-07-10 DIAGNOSIS — Z9889 Other specified postprocedural states: Secondary | ICD-10-CM | POA: Diagnosis not present

## 2017-07-10 LAB — COMPREHENSIVE METABOLIC PANEL
ALT: 20 IU/L (ref 0–44)
AST: 22 IU/L (ref 0–40)
Albumin/Globulin Ratio: 1.7 (ref 1.2–2.2)
Albumin: 4.2 g/dL (ref 3.5–4.7)
Alkaline Phosphatase: 110 IU/L (ref 39–117)
BUN/Creatinine Ratio: 17 (ref 10–24)
BUN: 23 mg/dL (ref 8–27)
Bilirubin Total: 0.4 mg/dL (ref 0.0–1.2)
CO2: 26 mmol/L (ref 20–29)
Calcium: 9.4 mg/dL (ref 8.6–10.2)
Chloride: 101 mmol/L (ref 96–106)
Creatinine, Ser: 1.34 mg/dL — ABNORMAL HIGH (ref 0.76–1.27)
GFR calc Af Amer: 55 mL/min/{1.73_m2} — ABNORMAL LOW (ref >59)
GFR calc non Af Amer: 47 mL/min/{1.73_m2} — ABNORMAL LOW (ref >59)
Globulin, Total: 2.5 g/dL (ref 1.5–4.5)
Glucose: 94 mg/dL (ref 65–99)
Potassium: 4.7 mmol/L (ref 3.5–5.2)
Sodium: 141 mmol/L (ref 134–144)
Total Protein: 6.7 g/dL (ref 6.0–8.5)

## 2017-07-10 LAB — LIPID PANEL
Chol/HDL Ratio: 3.8 ratio (ref 0.0–5.0)
Cholesterol, Total: 222 mg/dL — ABNORMAL HIGH (ref 100–199)
HDL: 58 mg/dL (ref >39)
LDL Calculated: 152 mg/dL — ABNORMAL HIGH (ref 0–99)
Triglycerides: 61 mg/dL (ref 0–149)
VLDL Cholesterol Cal: 12 mg/dL (ref 5–40)

## 2017-07-14 ENCOUNTER — Ambulatory Visit (INDEPENDENT_AMBULATORY_CARE_PROVIDER_SITE_OTHER): Payer: Medicare Other | Admitting: *Deleted

## 2017-07-14 DIAGNOSIS — I5022 Chronic systolic (congestive) heart failure: Secondary | ICD-10-CM | POA: Diagnosis not present

## 2017-07-14 NOTE — Progress Notes (Signed)
Remote pacemaker transmission.   

## 2017-07-16 DIAGNOSIS — H348121 Central retinal vein occlusion, left eye, with retinal neovascularization: Secondary | ICD-10-CM | POA: Diagnosis not present

## 2017-07-28 ENCOUNTER — Other Ambulatory Visit: Payer: Self-pay | Admitting: Internal Medicine

## 2017-07-28 MED ORDER — TIOTROPIUM BROMIDE-OLODATEROL 2.5-2.5 MCG/ACT IN AERS: 2.0000 | INHALATION_SPRAY | Freq: Every day | RESPIRATORY_TRACT | 11 refills | Status: DC

## 2017-07-29 ENCOUNTER — Other Ambulatory Visit: Payer: Self-pay

## 2017-07-29 ENCOUNTER — Ambulatory Visit (HOSPITAL_COMMUNITY): Payer: Medicare Other | Attending: Cardiology

## 2017-07-29 DIAGNOSIS — E785 Hyperlipidemia, unspecified: Secondary | ICD-10-CM | POA: Insufficient documentation

## 2017-07-29 DIAGNOSIS — I6529 Occlusion and stenosis of unspecified carotid artery: Secondary | ICD-10-CM | POA: Insufficient documentation

## 2017-07-29 DIAGNOSIS — Z8673 Personal history of transient ischemic attack (TIA), and cerebral infarction without residual deficits: Secondary | ICD-10-CM | POA: Diagnosis not present

## 2017-07-29 DIAGNOSIS — Z48812 Encounter for surgical aftercare following surgery on the circulatory system: Secondary | ICD-10-CM | POA: Insufficient documentation

## 2017-07-29 DIAGNOSIS — I351 Nonrheumatic aortic (valve) insufficiency: Secondary | ICD-10-CM | POA: Diagnosis not present

## 2017-07-29 DIAGNOSIS — Z87891 Personal history of nicotine dependence: Secondary | ICD-10-CM | POA: Insufficient documentation

## 2017-07-29 DIAGNOSIS — I739 Peripheral vascular disease, unspecified: Secondary | ICD-10-CM | POA: Diagnosis not present

## 2017-07-29 DIAGNOSIS — Z952 Presence of prosthetic heart valve: Secondary | ICD-10-CM | POA: Insufficient documentation

## 2017-07-29 DIAGNOSIS — J449 Chronic obstructive pulmonary disease, unspecified: Secondary | ICD-10-CM | POA: Insufficient documentation

## 2017-07-29 DIAGNOSIS — R0600 Dyspnea, unspecified: Secondary | ICD-10-CM | POA: Diagnosis not present

## 2017-07-29 DIAGNOSIS — Z951 Presence of aortocoronary bypass graft: Secondary | ICD-10-CM | POA: Diagnosis not present

## 2017-07-29 DIAGNOSIS — I495 Sick sinus syndrome: Secondary | ICD-10-CM | POA: Insufficient documentation

## 2017-07-29 DIAGNOSIS — I350 Nonrheumatic aortic (valve) stenosis: Secondary | ICD-10-CM | POA: Diagnosis not present

## 2017-07-29 DIAGNOSIS — I442 Atrioventricular block, complete: Secondary | ICD-10-CM | POA: Insufficient documentation

## 2017-07-29 DIAGNOSIS — I10 Essential (primary) hypertension: Secondary | ICD-10-CM | POA: Diagnosis not present

## 2017-07-30 DIAGNOSIS — H35352 Cystoid macular degeneration, left eye: Secondary | ICD-10-CM | POA: Diagnosis not present

## 2017-07-30 DIAGNOSIS — H353114 Nonexudative age-related macular degeneration, right eye, advanced atrophic with subfoveal involvement: Secondary | ICD-10-CM | POA: Diagnosis not present

## 2017-07-30 DIAGNOSIS — H353123 Nonexudative age-related macular degeneration, left eye, advanced atrophic without subfoveal involvement: Secondary | ICD-10-CM | POA: Diagnosis not present

## 2017-07-30 DIAGNOSIS — H35032 Hypertensive retinopathy, left eye: Secondary | ICD-10-CM | POA: Diagnosis not present

## 2017-07-30 DIAGNOSIS — H43822 Vitreomacular adhesion, left eye: Secondary | ICD-10-CM | POA: Diagnosis not present

## 2017-07-30 DIAGNOSIS — H34812 Central retinal vein occlusion, left eye, with macular edema: Secondary | ICD-10-CM | POA: Diagnosis not present

## 2017-08-06 ENCOUNTER — Ambulatory Visit (INDEPENDENT_AMBULATORY_CARE_PROVIDER_SITE_OTHER): Payer: Medicare Other | Admitting: Pharmacist

## 2017-08-06 DIAGNOSIS — E782 Mixed hyperlipidemia: Secondary | ICD-10-CM | POA: Diagnosis not present

## 2017-08-06 MED ORDER — EZETIMIBE 10 MG PO TABS: 10.0000 mg | ORAL_TABLET | Freq: Every day | ORAL | 1 refills | Status: DC

## 2017-08-06 NOTE — Patient Instructions (Signed)
[facility] (pharmacist) [facility]er[facility] [phone]  *START ezetimibe (Zetia) 10mg  * *Continue taking rosuvastatin 40mg  daily* *Work on lifestyle modification* * Repeat fasting blood work in 3 months*    Cholesterol Cholesterol is a fat. Your body needs a small amount of cholesterol. Cholesterol (plaque) may build up in your blood vessels (arteries). That makes you more likely to have a heart attack or stroke. You cannot feel your cholesterol level. Having a blood test is the only way to find out if your level is high. Keep your test results. Work with your doctor to keep your cholesterol at a good level. What do the results mean?  Total cholesterol is how much cholesterol is in your blood.  LDL is bad cholesterol. This is the type that can build up. Try to have low LDL.  HDL is good cholesterol. It cleans your blood vessels and carries LDL away. Try to have high HDL.  Triglycerides are fat that the body can store or burn for energy. What are good levels of cholesterol?  Total cholesterol below 200.  LDL below 100 is good for people who have health risks. LDL below 70 is good for people who have very high risks.  HDL above 40 is good. It is best to have HDL of 60 or higher.  Triglycerides below 150. How can I lower my cholesterol? Diet Follow your diet program as told by your doctor.  Choose fish, white meat chicken, or [place] that is roasted or baked. Try not to eat red meat, fried foods, sausage, or lunch meats.  Eat lots of fresh fruits and vegetables.  Choose whole grains, beans, pasta, potatoes, and cereals.  Choose olive oil, corn oil, or canola oil. Only use small amounts.  Try not to eat butter, mayonnaise, shortening, or palm kernel oils.  Try not to eat foods with trans fats.  Choose low-fat or nonfat dairy foods. ? Drink skim or nonfat milk. ? Eat low-fat or nonfat yogurt and cheeses. ? Try not to drink whole milk or cream. ? Try not to eat ice cream,  egg yolks, or full-fat cheeses.  Healthy desserts include angel food cake, ginger snaps, animal crackers, hard candy, popsicles, and low-fat or nonfat frozen yogurt. Try not to eat pastries, cakes, pies, and cookies.  Exercise Follow your exercise program as told by your doctor.  Be more active. Try gardening, walking, and taking the stairs.  Ask your doctor about ways that you can be more active.  Medicine  Take over-the-counter and prescription medicines only as told by your doctor. This information is not intended to replace advice given to you by your health care provider. Make sure you discuss any questions you have with your health care provider. Document Released: 04/25/2008 Document Revised: 08/29/2015 Document Reviewed: 08/09/2015 Elsevier Interactive Patient Education  [employer].

## 2017-08-06 NOTE — Progress Notes (Signed)
Patient ID: Luis Townsend                 DOB: 11-23-1928                    MRN: [id number]     HPI: Luis Townsend is a 84 y.o. male patient of Dr Sallyanne Kuster referred to lipid clinic by Kerin Ransom PA-C. PMH is significant for hypertension, hyperlipidemia, aortic stenosis, TAVR in 2013, carotid stenosis, heart block, PAD, and CAD s/p CABG in 1995. Patient currently taking rosuvastatin 40mg  daily but admits missing "few doses" every week.  He also reports "relaxing" his diet a little bit more than before. Still eating high fiber low fat diet but currently enjoys "few more sausages" than before.  Current Medications:  Rosuvastatin 40mg  daily  Previous trial:  rosuvsatatin 20mg   LDL goal: < 70mg /dL  Diet: mostly home cooked meals, lots of whole wheat and whole products, also likes sausages, and meat  Exercise: minimal; mainly activities of daily living  Family History: Reports stoke in mother; heart attach and heart disease in father  Social History: former smoker; denies alcohol, denies smokeless tobacco  Labs: 07/10/17: CHO 222; TG 61; HDL 58; LDL-c 152 (rosuvsatatin 40mg  - frequent missed doses) 08/28/15: CHO 176; TG 58; HDL 67; LDL-c 97 (rosuvsatatin 40mg  daily)  Past Medical History:  Diagnosis Date  . Amaurosis fugax of left eye 10/11/1996  . Aortic stenosis 06/30/2011   severe  . Aortoiliac occlusive disease ([place]) 06/30/2011  . Cardiac resynchronization therapy pacemaker (CRT-P) in place    a. 11/29/15:  Medtronic model [id number] Percepta Quad CRT-P MRI SureScan (serial Number [id number] )  . Cerebrovascular disease 06/30/2011  . CHB (complete heart block) ([place]) 02/03/2013  . COPD (chronic obstructive pulmonary disease), moderat to severe 07/29/2011  . Coronary artery disease    a. 10/29/15 Cath for decreased EF with patent 2/3 grafts, no change   . Hyperlipidemia   . Hypertension   . Myocardial infarction ([place]) 03/22/1994   acute LAD occlusion  . PAD (peripheral artery disease)  ([place])   . RBBB (right bundle branch block)   . S/P CABG x 5 03/28/1994   LIMA to LAD, sequential SVG to OM1-OM2-OM4, SVG to RCA, open vein harvest right leg - Dr Redmond Pulling  . S/P carotid endarterectomy 10/28/1996   Dr Donnetta Hutching  . Subclavian artery stenosis, left ([place]) 06/30/2011  . Tobacco abuse     Current Outpatient Medications on File Prior to Visit  Medication Sig Dispense Refill  . aspirin EC 81 MG tablet Take 81 mg by mouth daily.    . budesonide-formoterol (SYMBICORT) 160-4.5 MCG/ACT inhaler Inhale 2 puffs into the lungs 2 (two) times daily. 1 Inhaler 11  . carvedilol (COREG) 6.25 MG tablet TAKE 1 TABLET BY MOUTH TWICE A DAY WITH A MEAL 60 tablet 11  . furosemide (LASIX) 20 MG tablet TAKE 1 TABLET (20 MG TOTAL) BY MOUTH DAILY. 90 tablet 1  . irbesartan (AVAPRO) 75 MG tablet Take 1 tablet (75 mg total) daily by mouth. 90 tablet 1  . rosuvastatin (CRESTOR) 40 MG tablet Take 1 tablet (40 mg total) by mouth daily. 90 tablet 3  . Tiotropium Bromide-Olodaterol (STIOLTO RESPIMAT) 2.5-2.5 MCG/ACT AERS Inhale 2 puffs into the lungs daily. 1 Inhaler 11  . VENTOLIN HFA 108 (90 Base) MCG/ACT inhaler TAKE 2 PUFFS BY MOUTH EVERY 4 HOURS AS NEEDED 18 Inhaler 5   No current facility-administered medications on file prior to visit.  No Known Allergies  Hyperlipidemia LDL remains elevated for secondary prevention. Patient reports compliance issues with rosuvastatin but no problems with tolerability. We discussed indication, prior-authorization approval, administration and common side effects for Repatha/Praluent as an addition to therapy. Also discussed improve adherence to rosuvastatin plus adding ezetimibe 10mg  daily for better cholesterol management.  Patient preference if to work on diet modifications plus rosuvastatin 40mg  and ezetimibe 10mg  oral therapy. Will give a 3 months trial, then repeat fasting lipid panel. Patient agreed to [facility] if LDL remains > 70mg /dL after 3 months.    Luis Townsend  Rodriguez-Guzman PharmD, BCPS, [place] [id number] 08/07/2017 9:03 AM

## 2017-08-07 ENCOUNTER — Encounter: Payer: Self-pay | Admitting: Pharmacist

## 2017-08-07 NOTE — Progress Notes (Signed)
Thank you MCr 

## 2017-08-07 NOTE — Assessment & Plan Note (Signed)
LDL remains elevated for secondary prevention. Patient reports compliance issues with rosuvastatin but no problems with tolerability. We discussed indication, prior-authorization approval, administration and common side effects for Repatha/Praluent as an addition to therapy. Also discussed improve adherence to rosuvastatin plus adding ezetimibe 10mg  daily for better cholesterol management.  Patient preference if to work on diet modifications plus rosuvastatin 40mg  and ezetimibe 10mg  oral therapy. Will give a 3 months trial, then repeat fasting lipid panel. Patient agreed to [facility] if LDL remains > 70mg /dL after 3 months.

## 2017-08-20 LAB — CUP PACEART REMOTE DEVICE CHECK
Battery Voltage: 2.91 V
Brady Statistic AP VP Percent: 87.31 %
Brady Statistic AS VP Percent: 12.4 %
Brady Statistic RV Percent Paced: 99.7 %
Date Time Interrogation Session: 20190604042235
Implantable Lead Implant Date: 20171019
Implantable Lead Location: 753860
Implantable Lead Model: 4298
Implantable Lead Model: 5076
Lead Channel Impedance Value: 418 Ohm
Lead Channel Impedance Value: 475 Ohm
Lead Channel Impedance Value: 475 Ohm
Lead Channel Impedance Value: 532 Ohm
Lead Channel Impedance Value: 665 Ohm
Lead Channel Impedance Value: 798 Ohm
Lead Channel Impedance Value: 874 Ohm
Lead Channel Pacing Threshold Amplitude: 0.625 V
Lead Channel Sensing Intrinsic Amplitude: 2.5 mV
Lead Channel Setting Pacing Amplitude: 2 V
Lead Channel Setting Pacing Amplitude: 2.5 V
Lead Channel Setting Pacing Amplitude: 4.5 V
Lead Channel Setting Pacing Pulse Width: 0.4 ms
MDC IDC LEAD IMPLANT DT: 20131002
MDC IDC LEAD IMPLANT DT: 20131002
MDC IDC LEAD LOCATION: 753858
MDC IDC LEAD LOCATION: 753859
MDC IDC MSMT BATTERY REMAINING LONGEVITY: 21 mo
MDC IDC MSMT LEADCHNL LV IMPEDANCE VALUE: 342 Ohm
MDC IDC MSMT LEADCHNL LV IMPEDANCE VALUE: 418 Ohm
MDC IDC MSMT LEADCHNL LV IMPEDANCE VALUE: 437 Ohm
MDC IDC MSMT LEADCHNL LV IMPEDANCE VALUE: 665 Ohm
MDC IDC MSMT LEADCHNL LV IMPEDANCE VALUE: 874 Ohm
MDC IDC MSMT LEADCHNL LV PACING THRESHOLD AMPLITUDE: 2.25 V
MDC IDC MSMT LEADCHNL LV PACING THRESHOLD PULSEWIDTH: 1 ms
MDC IDC MSMT LEADCHNL RA IMPEDANCE VALUE: 342 Ohm
MDC IDC MSMT LEADCHNL RA IMPEDANCE VALUE: 399 Ohm
MDC IDC MSMT LEADCHNL RA SENSING INTR AMPL: 2.5 mV
MDC IDC MSMT LEADCHNL RV PACING THRESHOLD PULSEWIDTH: 0.4 ms
MDC IDC PG IMPLANT DT: 20171019
MDC IDC SET LEADCHNL LV PACING PULSEWIDTH: 1 ms
MDC IDC SET LEADCHNL RV SENSING SENSITIVITY: 4 mV
MDC IDC STAT BRADY AP VS PERCENT: 0.08 %
MDC IDC STAT BRADY AS VS PERCENT: 0.22 %
MDC IDC STAT BRADY RA PERCENT PACED: 87.21 %

## 2017-09-11 ENCOUNTER — Other Ambulatory Visit: Payer: Self-pay | Admitting: Cardiovascular Disease

## 2017-09-13 ENCOUNTER — Other Ambulatory Visit: Payer: Self-pay | Admitting: Adult Health

## 2017-09-15 NOTE — Telephone Encounter (Signed)
Request denied.  Filled on 06/03/17 for 1 year.  Request is too early.

## 2017-09-18 DIAGNOSIS — H353114 Nonexudative age-related macular degeneration, right eye, advanced atrophic with subfoveal involvement: Secondary | ICD-10-CM | POA: Diagnosis not present

## 2017-09-18 DIAGNOSIS — H34812 Central retinal vein occlusion, left eye, with macular edema: Secondary | ICD-10-CM | POA: Diagnosis not present

## 2017-09-18 DIAGNOSIS — H353123 Nonexudative age-related macular degeneration, left eye, advanced atrophic without subfoveal involvement: Secondary | ICD-10-CM | POA: Diagnosis not present

## 2017-09-18 DIAGNOSIS — H35032 Hypertensive retinopathy, left eye: Secondary | ICD-10-CM | POA: Diagnosis not present

## 2017-10-02 ENCOUNTER — Encounter: Payer: Self-pay | Admitting: Internal Medicine

## 2017-10-02 ENCOUNTER — Ambulatory Visit (INDEPENDENT_AMBULATORY_CARE_PROVIDER_SITE_OTHER): Payer: Medicare Other | Admitting: Internal Medicine

## 2017-10-02 VITALS — BP 128/70 | HR 72 | Ht 66.0 in | Wt 142.0 lb

## 2017-10-02 DIAGNOSIS — J449 Chronic obstructive pulmonary disease, unspecified: Secondary | ICD-10-CM

## 2017-10-02 NOTE — Assessment & Plan Note (Signed)
Quit smoking 2013 - PFT's  07/09/11  FEV1 1.5 (69 % ) ratio 51  p 21 % improvement from saba p ? prior to study with DLCO  64/65c % corrects to 74 % for alv volume   - 05/30/2016    try change from advair to symbicort 160 - Spirometry 05/08/2017  FEV1 0.73 (35%)  Ratio 47 ? p am symb 160 > added spiriva   - 05/26/2017  After extensive coaching inhaler device  effectiveness =    75% from a baseline of < 50%  - PFT's  07/02/2017  FEV1 0.87 (44 % ) ratio 40  p 21 % improvement from saba p 30/31 prior to study with DLCO  30/31 % corrects to 50 % for alv volume   - 07/02/2017    changed to stiolto 2 pffs each am > not covered - alpha one screening 07/02/2017 >>> MM  Level 167   - 10/02/2017  After extensive coaching inhaler device,  effectiveness =    90% from baseline 75% and doing fine on just symb 160 2bid so f/u prn    I had an extended discussion with the patient reviewing all relevant studies completed to date and  lasting 15 to 20 minutes of a 25 minute visit on the following ongoing concerns:   1) becoming confused with details of care and less active in general so rec keep things simple  2) if breathing worse change symbicort to bevespi if covered and/or consider change coreg to bisoprolol  3) See device teaching which extended face to face time for this visit   4) Each maintenance medication was reviewed in detail including most importantly the difference between maintenance and as needed and under what circumstances the prns are to be used.  Please see AVS for specific  Instructions which are unique to this visit and I personally typed out  which were reviewed in detail in writing with the patient and a copy provided.     Pulmonary f/u is prn

## 2017-10-02 NOTE — Patient Instructions (Signed)
Plan A = Automatic = symbicort 160 Take 2 puffs first thing in am and then another 2 puffs about 12 hours later.   Work on inhaler technique:  relax and gently blow all the way out then take a nice smooth deep breath back in, triggering the inhaler at same time you start breathing in.  Hold for up to 5 seconds if you can. Blow out thru nose. Rinse and gargle with water when done      Plan B = Backup Only use your albuterol as a rescue medication to be used if you can't catch your breath by resting or doing a relaxed purse lip breathing pattern.  - The less you use it, the better it will work when you need it. - Ok to use the inhaler up to 2 puffs  every 4 hours if you must but call for appointment if use goes up over your usual need - Don't leave home without it !!  (think of it like the spare tire for your car)     If you are satisfied with your treatment plan,  let your doctor know and he/she can either refill your medications or you can return here when your prescription runs out.     If in any way you are not 100% satisfied,  please tell us.  If 100% better, tell your friends!  Pulmonary follow up is as needed

## 2017-10-02 NOTE — Progress Notes (Signed)
Subjective:     Patient ID: Luis Townsend, male   DOB: 1928/06/08,    MRN: [id number]     Brief patient profile:  69 yowm quit smoking  02/2011 no problem with sob/cough and new cough/sob started around 2015 and worse since  fall 2017 referred to pulmonary clinic 05/30/2016 by Luis Townsend  With ? Copd     .History of Present Illness  05/30/2016 1st [facility] office visit/ Luis Townsend  On ACEi / symbicort  Chief Complaint  Patient presents with  . Pulmonary Consult    Referred by Luis Townsend. Pt c/o SOB for the past 2-3 yrs, worse since Jan 2018. He reports he gets SOB walking a few hundred ft, for example waslking to his mailbox. He does fine at rest. He has also had cough that comes and goes. Cough is occ prod with clear sputum. He finds it difficult to produce any sputum lately.   acutely ill in fall 2017 uri/pna and never completely recovered with persistent moderatedly severe daily cough min production and much worse doe = MMRC3 = can't walk 100 yards even at a slow pace at a flat grade s stopping due to sob   rec Plan A = Automatic =  Symbicort 160  Take 2 puffs first thing in am and then another 2 puffs about 12 hours later.  Work on inhaler technique:   Plan B = Backup Only use your albuterol (ventolin)  stop altace and start avapro 150 mg one daily - break in half if too strong     07/11/2016  f/u ov/Luis Townsend re:  COPD  GOLD II / symb 160 2bid / off acei x 6 weeks  Chief Complaint  Patient presents with  . Follow-up    Breathing has improved and he is coughing less. He is using ventolin hfa 3-4 x per wk on average.     Less albuterol now, sleeping fine s am exac  Doe = MMRC2 = can't walk a nl pace on a flat grade s sob but does fine slow and flat  rec When you finish the present symbicort 160 change to 80 2 pffs every 12 hours  Work on inhaler technique:  Remember to pace yourself    04/30/17  Continue on Symbicort Twice daily , rinse after use  Begin Spiriva 2 puffs  daily, rinse after use.  Chest x-ray today    05/26/2017  f/u ov/Luis Townsend re:  COPD II vs III only a little better p added spiriva but can't afford it/ suboptimal hfa  Chief Complaint  Patient presents with  . Follow-up    Breathing has only improved minimally. He has occ dry cough.  He is using his albuterol inhaler once daily on average.    Dyspnea:  Mailbox and back is flat 200 ft and stops =  MMRC3 = can't walk 100 yards even at a slow pace at a flat grade s stopping due to sob   Cough: no Sleep: ok  SABA use:  Once in pm rec Plan A = Automatic = Symbicort 160 Take 2 puffs first thing in am and then another 2 puffs about 12 hours later.  Work on inhaler technique: Plan B = Backup Only use your albuterol as a rescue medication  do alpha one screening to be complete     07/02/2017  f/u ov/Luis Townsend re: copd GOLD III  maint on symb/spiriva Chief Complaint  Patient presents with  . Follow-up    PFT's done today. Breathing  is doing some better. He is coughing less. He is producing some minimal clear sputum.     Dyspnea:  Mailbox slow but not stopping now Cough: no Sleep: ok  SABA use: p ex only  rec Plan A = Automatic = stiolto 2 pffs each am in place symbicort/ spriva Work on inhaler technique:  relax and gently blow all the way out then take a nice smooth deep breath back in, triggering the inhaler at same time you start breathing in.  Hold for up to 5 seconds if you can.   Rinse and gargle with water when done Plan B = Backup = proair   10/02/2017  f/u ov/Luis Townsend re: COPD  GOLD III - on just maint symbicort  Chief Complaint  Patient presents with  . Follow-up    feels the same as last ov  Dyspnea:  No change since off spiriva  Cough: less now Sleeping: prefers on L side one pillow SABA use:  2 puffs daily at most  02: none    No obvious day to day or daytime variability or assoc excess/ purulent sputum or mucus plugs or hemoptysis or cp or chest tightness, subjective wheeze or  overt sinus or hb symptoms.   Sleeps as above  without nocturnal  or early am exacerbation  of respiratory  c/o's or need for noct saba. Also denies any obvious fluctuation of symptoms with weather or environmental changes or other aggravating or alleviating factors except as outlined above   No unusual exposure hx or h/o childhood pna/ asthma or knowledge of premature birth.  Current Allergies, Complete Past Medical History, Past Surgical History, Family History, and Social History were reviewed in [employer] record.  ROS  The following are not active complaints unless bolded Hoarseness, sore throat, dysphagia, dental problems, itching, sneezing,  nasal congestion or discharge of excess mucus or purulent secretions, ear ache,   fever, chills, sweats, unintended wt loss or wt gain, classically pleuritic or exertional cp,  orthopnea pnd or arm/hand swelling  or leg swelling, presyncope, palpitations, abdominal pain, anorexia, nausea, vomiting, diarrhea  or change in bowel habits or change in bladder habits, change in stools or change in urine, dysuria, hematuria,  rash, arthralgias, visual complaints, headache, numbness, weakness or ataxia or problems with walking or coordination,  change in mood or  Memory. Other: ED        Current Meds  Medication Sig  . aspirin EC 81 MG tablet Take 81 mg by mouth daily.  . budesonide-formoterol (SYMBICORT) 160-4.5 MCG/ACT inhaler Inhale 2 puffs into the lungs 2 (two) times daily.  . carvedilol (COREG) 6.25 MG tablet TAKE 1 TABLET BY MOUTH TWICE A DAY WITH A MEAL  . ezetimibe (ZETIA) 10 MG tablet Take 1 tablet (10 mg total) by mouth daily.  . furosemide (LASIX) 20 MG tablet TAKE 1 TABLET (20 MG TOTAL) BY MOUTH DAILY.  Marland Kitchen irbesartan (AVAPRO) 75 MG tablet Take 1 tablet (75 mg total) daily by mouth.  . rosuvastatin (CRESTOR) 40 MG tablet TAKE 1 TABLET (40 MG TOTAL) BY MOUTH DAILY.  . VENTOLIN HFA 108 (90 Base) MCG/ACT inhaler TAKE 2 PUFFS  BY MOUTH EVERY 4 HOURS AS NEEDED                    Objective:   Physical Exam    amb wm nad   10/02/2017       142 07/02/2017       146  05/26/2017  147   07/11/2016        142   05/30/16 143 lb (64.9 kg)  04/29/16 147 lb (66.7 kg)  04/15/16 148 lb (67.1 kg)    Vital signs reviewed - Note on arrival 02 sats  97% on RA       HEENT: nl dentition / oropharynx. Nl external ear canals without cough reflex -  Mild  bilateral non-specific turbinate edema     NECK :  without JVD/Nodes/TM/ nl carotid upstrokes bilaterally   LUNGS: no acc muscle use,  Mid barrel  contour chest wall with bilateral  Distant bs s audible wheeze and  without cough on insp or exp maneuver and mild  Hyperresonant  to  percussion bilaterally     CV:  RRR  no s3 or murmur or increase in P2, and no edema   ABD:  soft and nontender with pos mid  insp Hoover's  in the supine position. No bruits or organomegaly appreciated, bowel sounds nl  MS:   Nl gait/  ext warm without deformities, calf tenderness, cyanosis or clubbing No obvious joint restrictions   SKIN: warm and dry without lesions    NEURO:  alert, approp, nl sensorium with  no motor or cerebellar deficits apparent.                    Assessment:

## 2017-10-08 ENCOUNTER — Encounter: Payer: Self-pay | Admitting: Adult Health

## 2017-10-08 ENCOUNTER — Ambulatory Visit (INDEPENDENT_AMBULATORY_CARE_PROVIDER_SITE_OTHER): Payer: Medicare Other | Admitting: Adult Health

## 2017-10-08 VITALS — BP 136/78 | Temp 97.8°F | Wt 148.0 lb

## 2017-10-08 DIAGNOSIS — N529 Male erectile dysfunction, unspecified: Secondary | ICD-10-CM

## 2017-10-08 MED ORDER — TADALAFIL 5 MG PO TABS: 5.0000 mg | ORAL_TABLET | Freq: Every day | ORAL | 0 refills | Status: DC | PRN

## 2017-10-08 NOTE — Progress Notes (Signed)
Subjective:    Patient ID: Luis Townsend, male    DOB: 07/21/1928, 84 y.o.   MRN: [id number]  HPI  84 year old male who  has a past medical history of Amaurosis fugax of left eye (10/11/1996), Aortic stenosis (06/30/2011), Aortoiliac occlusive disease ([place]) (06/30/2011), Cardiac resynchronization therapy pacemaker (CRT-P) in place, Cerebrovascular disease (06/30/2011), CHB (complete heart block) ([place]) (02/03/2013), COPD (chronic obstructive pulmonary disease), moderat to severe (07/29/2011), Coronary artery disease, Hyperlipidemia, Hypertension, Myocardial infarction ([place]) (03/22/1994), PAD (peripheral artery disease) ([facility]), RBBB (right bundle branch block), S/P CABG x 5 (03/28/1994), S/P carotid endarterectomy (10/28/1996), Subclavian artery stenosis, left ([place]) (06/30/2011), and Tobacco abuse.  He presents to the office today for an acute complaint of ED. He reports that his sex life has become non existent for some time now but that he and his wife would like to continue to have sex. He reports that he is unable to get an erection during foreplay. He does report waking up some mornings with an erection.   He denies any CP - has significant cardiac history.   Review of Systems   See HPI  Past Medical History:  Diagnosis Date  . Amaurosis fugax of left eye 10/11/1996  . Aortic stenosis 06/30/2011   severe  . Aortoiliac occlusive disease ([place]) 06/30/2011  . Cardiac resynchronization therapy pacemaker (CRT-P) in place    a. 11/29/15:  Medtronic model P2RJ18 Percepta Quad CRT-P MRI SureScan (serial Number [id number] )  . Cerebrovascular disease 06/30/2011  . CHB (complete heart block) ([place]) 02/03/2013  . COPD (chronic obstructive pulmonary disease), moderat to severe 07/29/2011  . Coronary artery disease    a. 10/29/15 Cath for decreased EF with patent 2/3 grafts, no change   . Hyperlipidemia   . Hypertension   . Myocardial infarction ([place]) 03/22/1994   acute LAD occlusion  . PAD (peripheral artery  disease) ([place])   . RBBB (right bundle branch block)   . S/P CABG x 5 03/28/1994   LIMA to LAD, sequential SVG to OM1-OM2-OM4, SVG to RCA, open vein harvest right leg - Dr Redmond Pulling  . S/P carotid endarterectomy 10/28/1996   Dr Donnetta Hutching  . Subclavian artery stenosis, left ([place]) 06/30/2011  . Tobacco abuse     Social History   Socioeconomic History  . Marital status: Married    Spouse name: Not on file  . Number of children: Not on file  . Years of education: Not on file  . Highest education level: Not on file  Occupational History  . Not on file  Social Needs  . Financial resource strain: Not on file  . Food insecurity:    Worry: Not on file    Inability: Not on file  . Transportation needs:    Medical: Not on file    Non-medical: Not on file  Tobacco Use  . Smoking status: Former Smoker    Packs/day: 1.00    Years: 60.00    Pack years: 60.00    Types: Cigarettes    Last attempt to quit: 03/02/2011    Years since quitting: 6.6  . Smokeless tobacco: Never Used  Substance and Sexual Activity  . Alcohol use: No    Alcohol/week: 0.0 standard drinks  . Drug use: No  . Sexual activity: Not on file  Lifestyle  . Physical activity:    Days per week: Not on file    Minutes per session: Not on file  . Stress: Not on file  Relationships  . Social connections:  Talks on phone: Not on file    Gets together: Not on file    Attends religious service: Not on file    Active member of club or organization: Not on file    Attends meetings of clubs or organizations: Not on file    Relationship status: Not on file  . Intimate partner violence:    Fear of current or ex partner: Not on file    Emotionally abused: Not on file    Physically abused: Not on file    Forced sexual activity: Not on file  Other Topics Concern  . Not on file  Social History Narrative   Married for 63 years   Three children -    1. Son in [place]   2. Daughter in [place]   3.Son in [place]      Two grandchildren          Past Surgical History:  Procedure Laterality Date  . BILATERAL UPPER EXTREMITY ANGIOGRAM N/A 07/29/2011   Procedure: BILATERAL UPPER EXTREMITY ANGIOGRAM;  Surgeon: Lorretta Harp, MD;  Location: [facility] CATH LAB;  Service: Cardiovascular;  Laterality: N/A;  . CARDIAC CATHETERIZATION  06/24/2011   No intervention - Recommendation-angioplasty/stenting of Lft subclavian artery, high risk replacement of aortic valve w/ biological prosthesis and redo CABG w/ preservation of LIMA bypass and new SVG to PDA and OM, reevaluate carotid arteries and pulmonary function prior to surgery.  Marland Kitchen CARDIAC CATHETERIZATION N/A 10/29/2015   Procedure: Coronary/Graft Angiography;  Surgeon: Lorretta Harp, MD;  Location: [facility];  Service: Cardiovascular;  Laterality: N/A;  . CARDIAC VALVE REPLACEMENT    . CARDIOVASCULAR STRESS TEST  07/10/2009   Evidence of mild ischemia in Basal Anteriolateral and Mid Anterolateral regions-additional diaphragmatic attenuation. No ECG changes. Low risk.\  . CAROTID DOPPLER  10/18/2012   Rt bulb/prox ICA demonstrated 50-69% diameter reduction, Lft mid ICA demonstrated 50-69% diameter reduction, Lft subclavian- = to or <50% sig diameter reduction, Lft vertebral-appeared occluded  . CAROTID ENDARTERECTOMY  10/28/96  DR. TODD EARLY   LEFT AND DACRON PATCH ANGIOPLASTY  . CORONARY ARTERY BYPASS GRAFT  03/28/94 DR. CHARLES WILSON   x5(LIMA to LAD,SVG to OM1-OM2-OM4, SVG to RCA  . EP IMPLANTABLE DEVICE N/A 11/29/2015   Procedure: BiV Upgrade;  Surgeon: Will Meredith Leeds, MD;  Location: [facility];  Service: Cardiovascular;  Laterality: N/A;  . EYE SURGERY    . LEFT HEART CATHETERIZATION WITH CORONARY/GRAFT ANGIOGRAM N/A 06/24/2011   Procedure: LEFT HEART CATHETERIZATION WITH Beatrix Fetters;  Surgeon: Sanda Klein, MD;  Location: [facility]ATH [facility];  Service: Cardiovascular;  Laterality: N/A;  . PACEMAKER INSERTION  11/12/2011   Medtronic Adapta; model# ADDR01, serial#  [id number]  . PERIPHERAL VASCULAR ANGIOPLASTY  07/29/2011   Lft Subclavian 90% stenosis, stented w/ a 7x30mm long Cordis Genesis resulting in reduction of 90% stenosos to 0% resdiual.  . TRANSTHORACIC ECHOCARDIOGRAM  08/05/2012   EF 50-55%, septial motion showed abnormal function and dyssynergy, LA mild-moderately dilated    Family History  Problem Relation Age of Onset  . Stroke Mother   . Heart attack Father   . Heart disease Father   . Pneumonia Maternal Grandmother   . Diabetes Maternal Grandfather   . Heart attack Paternal Grandmother     No Known Allergies  Current Outpatient Medications on File Prior to Visit  Medication Sig Dispense Refill  . aspirin EC 81 MG tablet Take 81 mg by mouth daily.    . budesonide-formoterol (SYMBICORT) 160-4.5  MCG/ACT inhaler Inhale 2 puffs into the lungs 2 (two) times daily. 1 Inhaler 11  . carvedilol (COREG) 6.25 MG tablet TAKE 1 TABLET BY MOUTH TWICE A DAY WITH A MEAL 60 tablet 11  . ezetimibe (ZETIA) 10 MG tablet Take 1 tablet (10 mg total) by mouth daily. 90 tablet 1  . furosemide (LASIX) 20 MG tablet TAKE 1 TABLET (20 MG TOTAL) BY MOUTH DAILY. 90 tablet 1  . irbesartan (AVAPRO) 75 MG tablet Take 1 tablet (75 mg total) daily by mouth. 90 tablet 1  . rosuvastatin (CRESTOR) 40 MG tablet TAKE 1 TABLET (40 MG TOTAL) BY MOUTH DAILY. 90 tablet 1  . VENTOLIN HFA 108 (90 Base) MCG/ACT inhaler TAKE 2 PUFFS BY MOUTH EVERY 4 HOURS AS NEEDED 18 Inhaler 5   No current facility-administered medications on file prior to visit.     BP 136/78 (BP Location: Left Arm, Cuff Size: Normal)   Temp 97.8 F (36.6 C) (Oral)   Wt 148 lb (67.1 kg)   BMI 23.89 kg/m   BP Readings from Last 3 Encounters:  10/08/17 136/78  10/02/17 128/70  07/08/17 (!) 150/82       Objective:   Physical Exam  Constitutional: He is oriented to person, place, and time. He appears well-developed and well-nourished. No distress.  Cardiovascular: Normal rate, regular rhythm,  normal heart sounds and intact distal pulses.  Pulmonary/Chest: Effort normal and breath sounds normal.  Neurological: He is alert and oriented to person, place, and time.  Skin: He is not diaphoretic.  Psychiatric: He has a normal mood and affect. His behavior is normal. Thought content normal.  Nursing note and vitals reviewed.     Assessment & Plan:  1. Erectile dysfunction, unspecified erectile dysfunction type - We discussed medications for ED. Due to significant cardiac history, I would stay away from Viagra. We decided on low dose Cialsis. He knows about side effects and reviewed return precautions.   - tadalafil (CIALIS) 5 MG tablet; Take 1 tablet (5 mg total) by mouth daily as needed for erectile dysfunction.  Dispense: 10 tablet; Refill: 0 - Follow up as needed   Dorothyann Peng, NP

## 2017-10-13 ENCOUNTER — Ambulatory Visit (INDEPENDENT_AMBULATORY_CARE_PROVIDER_SITE_OTHER): Payer: Medicare Other | Admitting: *Deleted

## 2017-10-13 DIAGNOSIS — I5022 Chronic systolic (congestive) heart failure: Secondary | ICD-10-CM

## 2017-10-13 DIAGNOSIS — I428 Other cardiomyopathies: Secondary | ICD-10-CM

## 2017-10-13 NOTE — Progress Notes (Signed)
Remote pacemaker transmission.   

## 2017-11-02 DIAGNOSIS — H35352 Cystoid macular degeneration, left eye: Secondary | ICD-10-CM | POA: Diagnosis not present

## 2017-11-02 DIAGNOSIS — H35032 Hypertensive retinopathy, left eye: Secondary | ICD-10-CM | POA: Diagnosis not present

## 2017-11-02 DIAGNOSIS — H353123 Nonexudative age-related macular degeneration, left eye, advanced atrophic without subfoveal involvement: Secondary | ICD-10-CM | POA: Diagnosis not present

## 2017-11-02 DIAGNOSIS — H3562 Retinal hemorrhage, left eye: Secondary | ICD-10-CM | POA: Diagnosis not present

## 2017-11-02 DIAGNOSIS — H34812 Central retinal vein occlusion, left eye, with macular edema: Secondary | ICD-10-CM | POA: Diagnosis not present

## 2017-11-04 LAB — CUP PACEART REMOTE DEVICE CHECK
Battery Remaining Longevity: 20 mo
Battery Voltage: 2.91 V
Brady Statistic AP VP Percent: 85.75 %
Brady Statistic AS VP Percent: 14.06 %
Brady Statistic AS VS Percent: 0.14 %
Brady Statistic RV Percent Paced: 99.81 %
Date Time Interrogation Session: 20190903043510
Implantable Lead Implant Date: 20131002
Implantable Lead Implant Date: 20131002
Implantable Lead Implant Date: 20171019
Implantable Lead Location: 753858
Implantable Lead Location: 753859
Implantable Lead Model: 4298
Implantable Lead Model: 5076
Lead Channel Impedance Value: 323 Ohm
Lead Channel Impedance Value: 399 Ohm
Lead Channel Impedance Value: 475 Ohm
Lead Channel Impedance Value: 475 Ohm
Lead Channel Impedance Value: 589 Ohm
Lead Channel Impedance Value: 684 Ohm
Lead Channel Impedance Value: 779 Ohm
Lead Channel Impedance Value: 893 Ohm
Lead Channel Pacing Threshold Amplitude: 0.625 V
Lead Channel Pacing Threshold Amplitude: 2.75 V
Lead Channel Sensing Intrinsic Amplitude: 2 mV
Lead Channel Setting Pacing Amplitude: 2 V
Lead Channel Setting Pacing Amplitude: 3.75 V
Lead Channel Setting Pacing Pulse Width: 0.4 ms
Lead Channel Setting Pacing Pulse Width: 1 ms
MDC IDC LEAD LOCATION: 753860
MDC IDC MSMT LEADCHNL LV IMPEDANCE VALUE: 437 Ohm
MDC IDC MSMT LEADCHNL LV IMPEDANCE VALUE: 475 Ohm
MDC IDC MSMT LEADCHNL LV IMPEDANCE VALUE: 646 Ohm
MDC IDC MSMT LEADCHNL LV IMPEDANCE VALUE: 893 Ohm
MDC IDC MSMT LEADCHNL LV PACING THRESHOLD PULSEWIDTH: 1 ms
MDC IDC MSMT LEADCHNL RA IMPEDANCE VALUE: 342 Ohm
MDC IDC MSMT LEADCHNL RA IMPEDANCE VALUE: 399 Ohm
MDC IDC MSMT LEADCHNL RA SENSING INTR AMPL: 2 mV
MDC IDC MSMT LEADCHNL RV PACING THRESHOLD PULSEWIDTH: 0.4 ms
MDC IDC PG IMPLANT DT: 20171019
MDC IDC SET LEADCHNL RA PACING AMPLITUDE: 2.5 V
MDC IDC SET LEADCHNL RV SENSING SENSITIVITY: 4 mV
MDC IDC STAT BRADY AP VS PERCENT: 0.05 %
MDC IDC STAT BRADY RA PERCENT PACED: 85.55 %

## 2017-11-25 DIAGNOSIS — H353133 Nonexudative age-related macular degeneration, bilateral, advanced atrophic without subfoveal involvement: Secondary | ICD-10-CM | POA: Diagnosis not present

## 2017-11-25 DIAGNOSIS — H348122 Central retinal vein occlusion, left eye, stable: Secondary | ICD-10-CM | POA: Diagnosis not present

## 2017-11-25 DIAGNOSIS — H25812 Combined forms of age-related cataract, left eye: Secondary | ICD-10-CM | POA: Diagnosis not present

## 2017-11-25 DIAGNOSIS — H26491 Other secondary cataract, right eye: Secondary | ICD-10-CM | POA: Diagnosis not present

## 2017-11-25 DIAGNOSIS — Z961 Presence of intraocular lens: Secondary | ICD-10-CM | POA: Diagnosis not present

## 2017-12-01 DIAGNOSIS — Z23 Encounter for immunization: Secondary | ICD-10-CM | POA: Diagnosis not present

## 2017-12-08 NOTE — Progress Notes (Signed)
Subjective:   Luis Townsend is a 84 y.o. male who presents for Medicare Annual/Subsequent preventive examination.  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 TETANUS/TDAP  03/08/1947  . INFLUENZA VACCINE  09/10/2017   [facility]   Cardiac Risk Factors include: advanced age (>41men, >19 women);dyslipidemia;family history of premature cardiovascular disease;hypertension     Objective:    Vitals: BP 138/60   Ht 5\' 6"  (1.676 m)   Wt 147 lb (66.7 kg)   BMI 23.73 kg/m   Body mass index is 23.73 kg/m.  Advanced Directives 12/09/2017 11/29/2015 10/29/2015 07/29/2011 07/29/2011 07/28/2011 06/24/2011  Does Patient Have a Medical Advance Directive? Yes Yes Yes Patient does not have advance directive Patient does not have advance directive Patient does not have advance directive Patient has advance directive, copy not in chart  Type of Advance Directive - [place];Living will - - - - -  Does patient want to make changes to medical advance directive? - No - Patient declined - - - - -  Copy of [place] in Chart? - No - copy requested No - copy requested - - - -  Pre-existing out of facility DNR order (yellow form or pink MOST form) - - - No No No -    Tobacco Social History   Tobacco Use  Smoking Status Former Smoker  . Packs/day: 1.00  . Years: 60.00  . Pack years: 60.00  . Types: Cigarettes  . Last attempt to quit: 03/02/2011  . Years since quitting: 6.7  Smokeless  Tobacco Never Used     Counseling given: Yes   Clinical Intake:     Past Medical History:  Diagnosis Date  . Amaurosis fugax of left eye 10/11/1996  . Aortic stenosis 06/30/2011   severe  . Aortoiliac occlusive disease ([place]) 06/30/2011  . Cardiac resynchronization therapy pacemaker (CRT-P) in place    a. 11/29/15:  Medtronic model J5TS17 Percepta Quad CRT-P MRI SureScan (serial Number [id number] )  . Cerebrovascular disease 06/30/2011  . CHB (complete heart block) ([place]) 02/03/2013  . COPD (chronic obstructive pulmonary disease), moderat to severe 07/29/2011  . Coronary artery disease    a. 10/29/15 Cath for decreased EF with patent 2/3 grafts, no change   . Hyperlipidemia   . Hypertension   . Myocardial infarction ([place]) 03/22/1994   acute LAD occlusion  . PAD (peripheral artery disease) ([place])   . RBBB (right bundle branch block)   . S/P CABG x 5 03/28/1994   LIMA to LAD, sequential SVG to OM1-OM2-OM4, SVG to RCA, open vein harvest right leg - Dr Redmond Pulling  . S/P carotid endarterectomy 10/28/1996   Dr Donnetta Hutching  . Subclavian artery stenosis, left ([place]) 06/30/2011  . Tobacco abuse    Past Surgical History:  Procedure Laterality Date  . BILATERAL UPPER EXTREMITY ANGIOGRAM N/A 07/29/2011  Procedure: BILATERAL UPPER EXTREMITY ANGIOGRAM;  Surgeon: Lorretta Harp, MD;  Location: [facility] CATH LAB;  Service: Cardiovascular;  Laterality: N/A;  . CARDIAC CATHETERIZATION  06/24/2011   No intervention - Recommendation-angioplasty/stenting of Lft subclavian artery, high risk replacement of aortic valve w/ biological prosthesis and redo CABG w/ preservation of LIMA bypass and new SVG to PDA and OM, reevaluate carotid arteries and pulmonary function prior to surgery.  Marland Kitchen CARDIAC CATHETERIZATION N/A 10/29/2015   Procedure: Coronary/Graft Angiography;  Surgeon: Lorretta Harp, MD;  Location: [place] CV LAB;  Service: Cardiovascular;  Laterality: N/A;  . CARDIAC VALVE REPLACEMENT    . CARDIOVASCULAR STRESS  TEST  07/10/2009   Evidence of mild ischemia in Basal Anteriolateral and Mid Anterolateral regions-additional diaphragmatic attenuation. No ECG changes. Low risk.\  . CAROTID DOPPLER  10/18/2012   Rt bulb/prox ICA demonstrated 50-69% diameter reduction, Lft mid ICA demonstrated 50-69% diameter reduction, Lft subclavian- = to or <50% sig diameter reduction, Lft vertebral-appeared occluded  . CAROTID ENDARTERECTOMY  10/28/96  DR. TODD EARLY   LEFT AND DACRON PATCH ANGIOPLASTY  . CATARACT EXTRACTION Right 2017  . CORONARY ARTERY BYPASS GRAFT  03/28/94 DR. CHARLES WILSON   x5(LIMA to LAD,SVG to OM1-OM2-OM4, SVG to RCA  . EP IMPLANTABLE DEVICE N/A 11/29/2015   Procedure: BiV Upgrade;  Surgeon: Will Meredith Leeds, MD;  Location: [place] [facility] CV LAB;  Service: Cardiovascular;  Laterality: N/A;  . EYE SURGERY    . LEFT HEART CATHETERIZATION WITH CORONARY/GRAFT ANGIOGRAM N/A 06/24/2011   Procedure: LEFT HEART CATHETERIZATION WITH Beatrix Fetters;  Surgeon: Sanda Klein, MD;  Location: [facility] CATH LAB;  Service: Cardiovascular;  Laterality: N/A;  . PACEMAKER INSERTION  11/12/2011   Medtronic Adapta; model# ADDR01, serial# [id number]  . PERIPHERAL VASCULAR ANGIOPLASTY  07/29/2011   Lft Subclavian 90% stenosis, stented w/ a 7x74mm long Cordis Genesis resulting in reduction of 90% stenosos to 0% resdiual.  . TRANSTHORACIC ECHOCARDIOGRAM  08/05/2012   EF 50-55%, septial motion showed abnormal function and dyssynergy, LA mild-moderately dilated   Family History  Problem Relation Age of Onset  . Stroke Mother 1       died of stroke  . Heart attack Father 64       55 did not drink; had polio  . Heart disease Father   . Pneumonia Maternal Grandmother   . Diabetes Maternal Grandfather   . Heart attack Paternal Grandmother    Social History   Socioeconomic History  . Marital status: Married    Spouse name: Not on file  . Number of children: Not on file  . Years of education: Not on file  . Highest  education level: Not on file  Occupational History  . Not on file  Social Needs  . Financial resource strain: Not on file  . Food insecurity:    Worry: Not on file    Inability: Not on file  . Transportation needs:    Medical: Not on file    Non-medical: Not on file  Tobacco Use  . Smoking status: Former Smoker    Packs/day: 1.00    Years: 60.00    Pack years: 60.00    Types: Cigarettes    Last attempt to quit: 03/02/2011    Years since quitting: 6.7  . Smokeless tobacco: Never Used  Substance and Sexual Activity  . Alcohol use: No    Alcohol/week: 0.0 standard drinks  . Drug use: No  . Sexual activity: Not on file  Lifestyle  . Physical  activity:    Days per week: Not on file    Minutes per session: Not on file  . Stress: Not on file  Relationships  . Social connections:    Talks on phone: Not on file    Gets together: Not on file    Attends religious service: Not on file    Active member of club or organization: Not on file    Attends meetings of clubs or organizations: Not on file    Relationship status: Not on file  Other Topics Concern  . Not on file  Social History Narrative   Married for 63 years   Three children -    1. Son in [place]   2. Daughter in [place]   3.Son in [place]      Two grandchildren        Outpatient Encounter Medications as of 12/09/2017  Medication Sig  . aspirin EC 81 MG tablet Take 81 mg by mouth daily.  . budesonide-formoterol (SYMBICORT) 160-4.5 MCG/ACT inhaler Inhale 2 puffs into the lungs 2 (two) times daily.  . carvedilol (COREG) 6.25 MG tablet TAKE 1 TABLET BY MOUTH TWICE A DAY WITH A MEAL  . ezetimibe (ZETIA) 10 MG tablet Take 1 tablet (10 mg total) by mouth daily.  . furosemide (LASIX) 20 MG tablet TAKE 1 TABLET (20 MG TOTAL) BY MOUTH DAILY.  Marland Kitchen irbesartan (AVAPRO) 75 MG tablet Take 1 tablet (75 mg total) daily by mouth.  . rosuvastatin (CRESTOR) 40 MG tablet TAKE 1 TABLET (40 MG TOTAL) BY MOUTH DAILY.  . tadalafil (CIALIS) 5 MG  tablet Take 1 tablet (5 mg total) by mouth daily as needed for erectile dysfunction.  . VENTOLIN HFA 108 (90 Base) MCG/ACT inhaler TAKE 2 PUFFS BY MOUTH EVERY 4 HOURS AS NEEDED   No facility-administered encounter medications on file as of 12/09/2017.     Activities of Daily Living In your present state of health, do you have any difficulty performing the following activities: 12/09/2017  Hearing? N  Vision? N  Difficulty concentrating or making decisions? N  Walking or climbing stairs? N  Dressing or bathing? N  Doing errands, shopping? N  Preparing Food and eating ? N  Using the Toilet? N  In the past six months, have you accidently leaked urine? N  Do you have problems with loss of bowel control? N  Managing your Medications? N  Managing your Finances? N  Housekeeping or managing your Housekeeping? N  Some recent data might be hidden    Patient Care Team: Dorothyann Peng, NP as PCP - General (Family Medicine)   Assessment:   This is a routine wellness examination for Justinn.  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 Patient Stated     Stay active Will create another project         Fall Risk Fall Risk  12/09/2017 12/26/2016 08/29/2016 10/20/2014  Falls in the past year? No No No No  Comment - - Emmi Telephone Survey: data to providers prior to load -     Depression Screen PHQ 2/9 Scores 12/09/2017 12/26/2016 10/20/2014 12/31/2011  PHQ - 2 Score 0 0 1 0   Burying his friends now Sometimes melancholy    Cognitive Function MMSE - Mini Mental State Exam 12/09/2017  Not completed: (No Data)     Ad8 score reviewed for issues:  Issues making decisions:  Less interest in hobbies / activities:  Repeats questions, stories (family complaining):  Trouble using ordinary gadgets (microwave, computer, phone):  Forgets the month  or year:   Mismanaging finances:   Remembering appts:  Daily problems with thinking and/or memory: Ad8 score is=        Immunization History  Administered Date(s) Administered  . Influenza, High Dose Seasonal PF 12/08/2016  . Influenza-Unspecified 11/11/2014, 11/20/2015, 11/27/2017  . Pneumococcal Conjugate-13 12/26/2016  . Pneumococcal Polysaccharide-23 12/14/2015  . Pneumococcal-Unspecified 11/28/2012      Screening Tests Health Maintenance  Topic Date Due  . TETANUS/TDAP  03/08/1947  . INFLUENZA VACCINE  09/10/2017  . 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 Sensitivity recently but then went away  States it is not bothering him now. Do you want to see him?   Nurse Concerns; Did not mention the shingrix; will send out a letter or call   Next PCP apt TBS       I have personally reviewed and noted the following in the patient's chart:   . Medical and social history . Use of alcohol, tobacco or illicit drugs  . Current medications and supplements . Functional ability and status . Nutritional status . Physical activity . Advanced directives . List of other physicians . Hospitalizations, surgeries, and ER visits in previous 12 months . Vitals . Screenings to include cognitive, depression, and falls . Referrals and appointments  In addition, I have reviewed and discussed with patient certain preventive protocols, quality metrics, and best practice recommendations. A written personalized care plan for preventive services as well as general preventive health recommendations were provided to patient.     JJOAC,ZYSAY, RN  12/09/2017

## 2017-12-09 ENCOUNTER — Telehealth: Payer: Self-pay

## 2017-12-09 ENCOUNTER — Ambulatory Visit (INDEPENDENT_AMBULATORY_CARE_PROVIDER_SITE_OTHER): Payer: Medicare Other

## 2017-12-09 VITALS — BP 138/60 | Ht 66.0 in | Wt 147.0 lb

## 2017-12-09 DIAGNOSIS — Z0000 Encounter for general adult medical examination without abnormal findings: Secondary | ICD-10-CM | POA: Diagnosis not present

## 2017-12-09 NOTE — Telephone Encounter (Signed)
Will send letter out regarding the shingrix  S/p AWV  Wynetta Fines rN

## 2017-12-09 NOTE — Progress Notes (Signed)
I have reviewed documentation for AWV and Advance Care Planning provided by the health coach and agree with documentation. I was immediately available for questions.

## 2017-12-09 NOTE — Patient Instructions (Addendum)
Luis Townsend , Thank you for taking time to come for your Medicare Wellness Visit. I appreciate your ongoing commitment to your health goals. Please review the following plan we discussed and let me know if I can assist you in the future.   You can go anywhere it get a hearing test [facility] & [facility] of [facility] - can assist with hearing aid x 1  No reviews  [place]  [place]  [phone]  [email or web address]-[email or web address]/[email or web address]_How_to_Buy_Hearing_Aids.pdf  A Tetanus is recommended every 10 years. Medicare covers a tetanus if you have a cut or wound; otherwise, there may be a charge. If you had not had a tetanus with pertusses, known as the Tdap, you can take this anytime.    These are the goals we discussed: Goals    . Patient Stated     Stay active Will create another project         This is a list of the screening recommended for you and due dates:  Health Maintenance  Topic Date Due  . Tetanus Vaccine  03/08/1947  . Flu Shot  09/10/2017  . Pneumonia vaccines  Completed      Fall Prevention in the Home Falls can cause injuries. They can happen to people of all ages. There are many things you can do to make your home safe and to help prevent falls. What can I do on the outside of my home?  Regularly fix the edges of walkways and driveways and fix any cracks.  Remove anything that might make you trip as you walk through a door, such as a raised step or threshold.  Trim any bushes or trees on the path to your home.  Use bright outdoor lighting.  Clear any walking paths of anything that might make someone trip, such as rocks or tools.  Regularly check to see if handrails are loose or broken. Make sure that both sides of any steps have handrails.  Any raised decks and porches should have guardrails on the edges.  Have any leaves, snow, or ice cleared regularly.  Use sand or salt on  walking paths during winter.  Clean up any spills in your garage right away. This includes oil or grease spills. What can I do in the bathroom?  Use night lights.  Install grab bars by the toilet and in the tub and shower. Do not use towel bars as grab bars.  Use non-skid mats or decals in the tub or shower.  If you need to sit down in the shower, use a plastic, non-slip stool.  Keep the floor dry. Clean up any water that spills on the floor as soon as it happens.  Remove soap buildup in the tub or shower regularly.  Attach bath mats securely with double-sided non-slip rug tape.  Do not have throw rugs and other things on the floor that can make you trip. What can I do in the bedroom?  Use night lights.  Make sure that you have a light by your bed that is easy to reach.  Do not use any sheets or blankets that are too big for your bed. They should not hang down onto the floor.  Have a firm chair that has side arms. You can use this for support while you get dressed.  Do not have throw rugs and other things on the floor that can make you trip. What can I do in the kitchen?  Clean up any spills  right away.  Avoid walking on wet floors.  Keep items that you use a lot in easy-to-reach places.  If you need to reach something above you, use a strong step stool that has a grab bar.  Keep electrical cords out of the way.  Do not use floor polish or wax that makes floors slippery. If you must use wax, use non-skid floor wax.  Do not have throw rugs and other things on the floor that can make you trip. What can I do with my stairs?  Do not leave any items on the stairs.  Make sure that there are handrails on both sides of the stairs and use them. Fix handrails that are broken or loose. Make sure that handrails are as long as the stairways.  Check any carpeting to make sure that it is firmly attached to the stairs. Fix any carpet that is loose or worn.  Avoid having throw  rugs at the top or bottom of the stairs. If you do have throw rugs, attach them to the floor with carpet tape.  Make sure that you have a light switch at the top of the stairs and the bottom of the stairs. If you do not have them, ask someone to add them for you. What else can I do to help prevent falls?  Wear shoes that: ? Do not have high heels. ? Have rubber bottoms. ? Are comfortable and fit you well. ? Are closed at the toe. Do not wear sandals.  If you use a stepladder: ? Make sure that it is fully opened. Do not climb a closed stepladder. ? Make sure that both sides of the stepladder are locked into place. ? Ask someone to hold it for you, if possible.  Clearly mark and make sure that you can see: ? Any grab bars or handrails. ? First and last steps. ? Where the edge of each step is.  Use tools that help you move around (mobility aids) if they are needed. These include: ? Canes. ? Walkers. ? Scooters. ? Crutches.  Turn on the lights when you go into a dark area. Replace any light bulbs as soon as they burn out.  Set up your furniture so you have a clear path. Avoid moving your furniture around.  If any of your floors are uneven, fix them.  If there are any pets around you, be aware of where they are.  Review your medicines with your doctor. Some medicines can make you feel dizzy. This can increase your chance of falling. Ask your doctor what other things that you can do to help prevent falls. This information is not intended to replace advice given to you by your health care provider. Make sure you discuss any questions you have with your health care provider. Document Released: 11/23/2008 Document Revised: 07/05/2015 Document Reviewed: 03/03/2014 Elsevier Interactive Patient Education  2018 [place] Maintenance, Male A healthy lifestyle and preventive care is important for your health and wellness. Ask your health care provider about what schedule of  regular examinations is right for you. What should I know about weight and diet? Eat a Healthy Diet  Eat plenty of vegetables, fruits, whole grains, low-fat dairy products, and lean protein.  Do not eat a lot of foods high in solid fats, added sugars, or salt.  Maintain a Healthy Weight Regular exercise can help you achieve or maintain a healthy weight. You should:  Do at least 150 minutes of exercise each  week. The exercise should increase your heart rate and make you sweat (moderate-intensity exercise).  Do strength-training exercises at least twice a week.  Watch Your Levels of Cholesterol and Blood Lipids  Have your blood tested for lipids and cholesterol every 5 years starting at 82 years of age. If you are at high risk for heart disease, you should start having your blood tested when you are 82 years old. You may need to have your cholesterol levels checked more often if: ? Your lipid or cholesterol levels are high. ? You are older than 82 years of age. ? You are at high risk for heart disease.  What should I know about cancer screening? Many types of cancers can be detected early and may often be prevented. Lung Cancer  You should be screened every year for lung cancer if: ? You are a current smoker who has smoked for at least 30 years. ? You are a former smoker who has quit within the past 15 years.  Talk to your health care provider about your screening options, when you should start screening, and how often you should be screened.  Colorectal Cancer  Routine colorectal cancer screening usually begins at 82 years of age and should be repeated every 5-10 years until you are 82 years old. You may need to be screened more often if early forms of precancerous polyps or small growths are found. Your health care provider may recommend screening at an earlier age if you have risk factors for colon cancer.  Your health care provider may recommend using home test kits to check for  hidden blood in the stool.  A small camera at the end of a tube can be used to examine your colon (sigmoidoscopy or colonoscopy). This checks for the earliest forms of colorectal cancer.  Prostate and Testicular Cancer  Depending on your age and overall health, your health care provider may do certain tests to screen for prostate and testicular cancer.  Talk to your health care provider about any symptoms or concerns you have about testicular or prostate cancer.  Skin Cancer  Check your skin from head to toe regularly.  Tell your health care provider about any new moles or changes in moles, especially if: ? There is a change in a mole's size, shape, or color. ? You have a mole that is larger than a pencil eraser.  Always use sunscreen. Apply sunscreen liberally and repeat throughout the day.  Protect yourself by wearing long sleeves, pants, a wide-brimmed hat, and sunglasses when outside.  What should I know about heart disease, diabetes, and high blood pressure?  If you are 32-64 years of age, have your blood pressure checked every 3-5 years. If you are 49 years of age or older, have your blood pressure checked every year. You should have your blood pressure measured twice-once when you are at a hospital or clinic, and once when you are not at a hospital or clinic. Record the average of the two measurements. To check your blood pressure when you are not at a hospital or clinic, you can use: ? An automated blood pressure machine at a pharmacy. ? A home blood pressure monitor.  Talk to your health care provider about your target blood pressure.  If you are between 64-75 years old, ask your health care provider if you should take aspirin to prevent heart disease.  Have regular diabetes screenings by checking your fasting blood sugar level. ? If you are at a normal  weight and have a low risk for diabetes, have this test once every three years after the age of 21. ? If you are  overweight and have a high risk for diabetes, consider being tested at a younger age or more often.  A one-time screening for abdominal aortic aneurysm (AAA) by ultrasound is recommended for men aged 48-75 years who are current or former smokers. What should I know about preventing infection? Hepatitis B If you have a higher risk for hepatitis B, you should be screened for this virus. Talk with your health care provider to find out if you are at risk for hepatitis B infection. Hepatitis C Blood testing is recommended for:  Everyone born from [birth year] through 1963.  Anyone with known risk factors for hepatitis C.  Sexually Transmitted Diseases (STDs)  You should be screened each year for STDs including gonorrhea and chlamydia if: ? You are sexually active and are younger than 82 years of age. ? You are older than 82 years of age and your health care provider tells you that you are at risk for this type of infection. ? Your sexual activity has changed since you were last screened and you are at an increased risk for chlamydia or gonorrhea. Ask your health care provider if you are at risk.  Talk with your health care provider about whether you are at high risk of being infected with HIV. Your health care provider may recommend a prescription medicine to help prevent HIV infection.  What else can I do?  Schedule regular health, dental, and eye exams.  Stay current with your vaccines (immunizations).  Do not use any tobacco products, such as cigarettes, chewing tobacco, and e-cigarettes. If you need help quitting, ask your health care provider.  Limit alcohol intake to no more than 2 drinks per day. One drink equals 12 ounces of beer, 5 ounces of wine, or 1 ounces of hard liquor.  Do not use street drugs.  Do not share needles.  Ask your health care provider for help if you need support or information about quitting drugs.  Tell your health care provider if you often feel  depressed.  Tell your health care provider if you have ever been abused or do not feel safe at home. This information is not intended to replace advice given to you by your health care provider. Make sure you discuss any questions you have with your health care provider. Document Released: 07/26/2007 Document Revised: 09/26/2015 Document Reviewed: 10/31/2014 Elsevier Interactive Patient Education  [employer].

## 2017-12-11 ENCOUNTER — Other Ambulatory Visit: Payer: Self-pay | Admitting: Cardiovascular Disease

## 2017-12-21 DIAGNOSIS — H34812 Central retinal vein occlusion, left eye, with macular edema: Secondary | ICD-10-CM | POA: Diagnosis not present

## 2017-12-21 DIAGNOSIS — H35352 Cystoid macular degeneration, left eye: Secondary | ICD-10-CM | POA: Diagnosis not present

## 2017-12-21 DIAGNOSIS — H35032 Hypertensive retinopathy, left eye: Secondary | ICD-10-CM | POA: Diagnosis not present

## 2017-12-21 DIAGNOSIS — H43822 Vitreomacular adhesion, left eye: Secondary | ICD-10-CM | POA: Diagnosis not present

## 2017-12-21 DIAGNOSIS — H348121 Central retinal vein occlusion, left eye, with retinal neovascularization: Secondary | ICD-10-CM | POA: Diagnosis not present

## 2017-12-29 ENCOUNTER — Other Ambulatory Visit: Payer: Self-pay

## 2018-01-12 ENCOUNTER — Ambulatory Visit (INDEPENDENT_AMBULATORY_CARE_PROVIDER_SITE_OTHER): Payer: Medicare Other

## 2018-01-12 ENCOUNTER — Telehealth: Payer: Self-pay

## 2018-01-12 DIAGNOSIS — I442 Atrioventricular block, complete: Secondary | ICD-10-CM

## 2018-01-12 NOTE — Telephone Encounter (Signed)
LMOVM reminding pt to send remote transmission.   

## 2018-01-13 NOTE — Progress Notes (Signed)
Remote pacemaker transmission.   

## 2018-01-19 ENCOUNTER — Encounter: Payer: Self-pay | Admitting: Cardiology

## 2018-02-08 DIAGNOSIS — H353123 Nonexudative age-related macular degeneration, left eye, advanced atrophic without subfoveal involvement: Secondary | ICD-10-CM | POA: Diagnosis not present

## 2018-02-08 DIAGNOSIS — H353114 Nonexudative age-related macular degeneration, right eye, advanced atrophic with subfoveal involvement: Secondary | ICD-10-CM | POA: Diagnosis not present

## 2018-02-08 DIAGNOSIS — H34812 Central retinal vein occlusion, left eye, with macular edema: Secondary | ICD-10-CM | POA: Diagnosis not present

## 2018-02-08 DIAGNOSIS — H2512 Age-related nuclear cataract, left eye: Secondary | ICD-10-CM | POA: Diagnosis not present

## 2018-02-08 DIAGNOSIS — H348121 Central retinal vein occlusion, left eye, with retinal neovascularization: Secondary | ICD-10-CM | POA: Diagnosis not present

## 2018-02-08 DIAGNOSIS — H35352 Cystoid macular degeneration, left eye: Secondary | ICD-10-CM | POA: Diagnosis not present

## 2018-02-10 LAB — CUP PACEART REMOTE DEVICE CHECK
Battery Remaining Longevity: 17 mo
Battery Voltage: 2.9 V
Brady Statistic AP VP Percent: 89.92 %
Brady Statistic AS VS Percent: 0.23 %
Implantable Lead Implant Date: 20131002
Implantable Lead Location: 753858
Implantable Lead Location: 753859
Implantable Lead Model: 4298
Implantable Lead Model: 5076
Implantable Pulse Generator Implant Date: 20171019
Lead Channel Impedance Value: 323 Ohm
Lead Channel Impedance Value: 342 Ohm
Lead Channel Impedance Value: 361 Ohm
Lead Channel Impedance Value: 399 Ohm
Lead Channel Impedance Value: 418 Ohm
Lead Channel Impedance Value: 437 Ohm
Lead Channel Impedance Value: 494 Ohm
Lead Channel Impedance Value: 874 Ohm
Lead Channel Pacing Threshold Pulse Width: 0.4 ms
Lead Channel Pacing Threshold Pulse Width: 1 ms
Lead Channel Sensing Intrinsic Amplitude: 2.25 mV
Lead Channel Setting Pacing Amplitude: 2.5 V
Lead Channel Setting Pacing Pulse Width: 0.4 ms
Lead Channel Setting Pacing Pulse Width: 1 ms
MDC IDC LEAD IMPLANT DT: 20131002
MDC IDC LEAD IMPLANT DT: 20171019
MDC IDC LEAD LOCATION: 753860
MDC IDC MSMT LEADCHNL LV IMPEDANCE VALUE: 437 Ohm
MDC IDC MSMT LEADCHNL LV IMPEDANCE VALUE: 551 Ohm
MDC IDC MSMT LEADCHNL LV IMPEDANCE VALUE: 646 Ohm
MDC IDC MSMT LEADCHNL LV IMPEDANCE VALUE: 665 Ohm
MDC IDC MSMT LEADCHNL LV IMPEDANCE VALUE: 779 Ohm
MDC IDC MSMT LEADCHNL LV IMPEDANCE VALUE: 855 Ohm
MDC IDC MSMT LEADCHNL LV PACING THRESHOLD AMPLITUDE: 2.375 V
MDC IDC MSMT LEADCHNL RA SENSING INTR AMPL: 2.25 mV
MDC IDC MSMT LEADCHNL RV PACING THRESHOLD AMPLITUDE: 0.875 V
MDC IDC SESS DTM: 20191204041305
MDC IDC SET LEADCHNL LV PACING AMPLITUDE: 3.5 V
MDC IDC SET LEADCHNL RV PACING AMPLITUDE: 2 V
MDC IDC SET LEADCHNL RV SENSING SENSITIVITY: 4 mV
MDC IDC STAT BRADY AP VS PERCENT: 0.08 %
MDC IDC STAT BRADY AS VP PERCENT: 9.77 %
MDC IDC STAT BRADY RA PERCENT PACED: 89.9 %
MDC IDC STAT BRADY RV PERCENT PACED: 99.69 %

## 2018-02-12 ENCOUNTER — Encounter: Payer: Self-pay | Admitting: *Deleted

## 2018-02-22 ENCOUNTER — Ambulatory Visit (INDEPENDENT_AMBULATORY_CARE_PROVIDER_SITE_OTHER): Payer: Medicare Other | Admitting: Cardiology

## 2018-02-22 ENCOUNTER — Encounter: Payer: Self-pay | Admitting: Cardiology

## 2018-02-22 VITALS — BP 126/64 | HR 88 | Ht 66.0 in | Wt 149.0 lb

## 2018-02-22 DIAGNOSIS — I10 Essential (primary) hypertension: Secondary | ICD-10-CM | POA: Diagnosis not present

## 2018-02-22 DIAGNOSIS — I442 Atrioventricular block, complete: Secondary | ICD-10-CM

## 2018-02-22 DIAGNOSIS — I350 Nonrheumatic aortic (valve) stenosis: Secondary | ICD-10-CM | POA: Diagnosis not present

## 2018-02-22 DIAGNOSIS — I25810 Atherosclerosis of coronary artery bypass graft(s) without angina pectoris: Secondary | ICD-10-CM

## 2018-02-22 DIAGNOSIS — I5022 Chronic systolic (congestive) heart failure: Secondary | ICD-10-CM

## 2018-02-22 LAB — CUP PACEART INCLINIC DEVICE CHECK
Date Time Interrogation Session: 20200113121757
Implantable Lead Implant Date: 20131002
Implantable Lead Implant Date: 20131002
Implantable Lead Location: 753858
Implantable Lead Location: 753859
Implantable Lead Location: 753860
Implantable Lead Model: 4298
Implantable Lead Model: 5076
Implantable Pulse Generator Implant Date: 20171019
MDC IDC LEAD IMPLANT DT: 20171019

## 2018-02-22 NOTE — Progress Notes (Signed)
Electrophysiology Office Note   Date:  02/22/2018   ID:  Luis Townsend, DOB [birth year], MRN [id number]  PCP:  Dorothyann Peng, NP  Cardiologist:  [place] Primary Electrophysiologist:  Braysen Cloward Meredith Leeds, MD    No chief complaint on file.    History of Present Illness: Luis Townsend is a 85 y.o. male who presents today for electrophysiology evaluation.   Hx sinus bradycardia and complete heart block (pacemaker dependent), CAD S/P CABG, AS S/P TAVR, PAD with multiple vessel involvement (carotid stenosis S/P left carotid endarterectomy, S/P left subclavian stent), hyperlipidemia, hypertension and COPD. Pacemaker interrogation July 28, shows 82.5% atrial pacing and 100% ventricular pacing without evidence of atrial fibrillation or ventricular tachycardia. He is pacemaker dependent. Follow-up echocardiogram performed on August 3 showed a drastic and unexpected reduction left ventricular systolic function, with EF down to 35-40%and diffuse hypokinesis.Marland Kitchen  CRT P on 11/28/16.  Repeat echo on 03/21/16 showed improvement of his EF to 50-55%.    Today, denies symptoms of palpitations, chest pain, shortness of breath, orthopnea, PND, lower extremity edema, claudication, dizziness, presyncope, syncope, bleeding, or neurologic sequela. The patient is tolerating medications without difficulties.    Past Medical History:  Diagnosis Date  . Amaurosis fugax of left eye 10/11/1996  . Aortic stenosis 06/30/2011   severe  . Aortoiliac occlusive disease ([place]) 06/30/2011  . Cardiac resynchronization therapy pacemaker (CRT-P) in place    a. 11/29/15:  Medtronic model W7PX10 Percepta Quad CRT-P MRI SureScan (serial Number [id number] )  . Cerebrovascular disease 06/30/2011  . CHB (complete heart block) ([place]) 02/03/2013  . COPD (chronic obstructive pulmonary disease), moderat to severe 07/29/2011  . Coronary artery disease    a. 10/29/15 Cath for decreased EF with patent 2/3 grafts, no change   . Hyperlipidemia     . Hypertension   . Myocardial infarction ([place]) 03/22/1994   acute LAD occlusion  . PAD (peripheral artery disease) ([place])   . RBBB (right bundle branch block)   . S/P CABG x 5 03/28/1994   LIMA to LAD, sequential SVG to OM1-OM2-OM4, SVG to RCA, open vein harvest right leg - Dr Redmond Pulling  . S/P carotid endarterectomy 10/28/1996   Dr Donnetta Hutching  . Subclavian artery stenosis, left ([place]) 06/30/2011  . Tobacco abuse    Past Surgical History:  Procedure Laterality Date  . BILATERAL UPPER EXTREMITY ANGIOGRAM N/A 07/29/2011   Procedure: BILATERAL UPPER EXTREMITY ANGIOGRAM;  Surgeon: Lorretta Harp, MD;  Location: [facility] CATH LAB;  Service: Cardiovascular;  Laterality: N/A;  . CARDIAC CATHETERIZATION  06/24/2011   No intervention - Recommendation-angioplasty/stenting of Lft subclavian artery, high risk replacement of aortic valve w/ biological prosthesis and redo CABG w/ preservation of LIMA bypass and new SVG to PDA and OM, reevaluate carotid arteries and pulmonary function prior to surgery.  Marland Kitchen CARDIAC CATHETERIZATION N/A 10/29/2015   Procedure: Coronary/Graft Angiography;  Surgeon: Lorretta Harp, MD;  Location: [place] CV LAB;  Service: Cardiovascular;  Laterality: N/A;  . CARDIAC VALVE REPLACEMENT    . CARDIOVASCULAR STRESS TEST  07/10/2009   Evidence of mild ischemia in Basal Anteriolateral and Mid Anterolateral regions-additional diaphragmatic attenuation. No ECG changes. Low risk.\  . CAROTID DOPPLER  10/18/2012   Rt bulb/prox ICA demonstrated 50-69% diameter reduction, Lft mid ICA demonstrated 50-69% diameter reduction, Lft subclavian- = to or <50% sig diameter reduction, Lft vertebral-appeared occluded  . CAROTID ENDARTERECTOMY  10/28/96  DR. TODD EARLY   LEFT AND DACRON PATCH ANGIOPLASTY  . CATARACT EXTRACTION Right 2017  .  CORONARY ARTERY BYPASS GRAFT  03/28/94 DR. CHARLES WILSON   x5(LIMA to LAD,SVG to OM1-OM2-OM4, SVG to RCA  . EP IMPLANTABLE DEVICE N/A 11/29/2015   Procedure: BiV Upgrade;   Surgeon: Ronin Rehfeldt Meredith Leeds, MD;  Location: [place] CV LAB;  Service: Cardiovascular;  Laterality: N/A;  . EYE SURGERY    . LEFT HEART CATHETERIZATION WITH CORONARY/GRAFT ANGIOGRAM N/A 06/24/2011   Procedure: LEFT HEART CATHETERIZATION WITH Beatrix Fetters;  Surgeon: Sanda Klein, MD;  Location: [place] CATH LAB;  Service: Cardiovascular;  Laterality: N/A;  . PACEMAKER INSERTION  11/12/2011   Medtronic Adapta; model# ADDR01, serial# [id number]  . PERIPHERAL VASCULAR ANGIOPLASTY  07/29/2011   Lft Subclavian 90% stenosis, stented w/ a 7x70mm long Cordis Genesis resulting in reduction of 90% stenosos to 0% resdiual.  . TRANSTHORACIC ECHOCARDIOGRAM  08/05/2012   EF 50-55%, septial motion showed abnormal function and dyssynergy, LA mild-moderately dilated     Current Outpatient Medications  Medication Sig Dispense Refill  . aspirin EC 81 MG tablet Take 81 mg by mouth daily.    . budesonide-formoterol (SYMBICORT) 160-4.5 MCG/ACT inhaler Inhale 2 puffs into the lungs 2 (two) times daily. 1 Inhaler 11  . carvedilol (COREG) 6.25 MG tablet TAKE 1 TABLET BY MOUTH TWICE A DAY WITH A MEAL 60 tablet 11  . ezetimibe (ZETIA) 10 MG tablet Take 1 tablet (10 mg total) by mouth daily. 90 tablet 1  . furosemide (LASIX) 20 MG tablet TAKE 1 TABLET BY MOUTH EVERY DAY 90 tablet 1  . irbesartan (AVAPRO) 75 MG tablet Take 1 tablet (75 mg total) daily by mouth. 90 tablet 1  . rosuvastatin (CRESTOR) 40 MG tablet TAKE 1 TABLET (40 MG TOTAL) BY MOUTH DAILY. 90 tablet 1  . tadalafil (CIALIS) 5 MG tablet Take 1 tablet (5 mg total) by mouth daily as needed for erectile dysfunction. 10 tablet 0  . VENTOLIN HFA 108 (90 Base) MCG/ACT inhaler TAKE 2 PUFFS BY MOUTH EVERY 4 HOURS AS NEEDED 18 Inhaler 5   No current facility-administered medications for this visit.     Allergies:   Patient has no known allergies.   Social History:  The patient  reports that he quit smoking about 6 years ago. His smoking use included  cigarettes. He has a 60.00 pack-year smoking history. He has never used smokeless tobacco. He reports that he does not drink alcohol or use drugs.   Family History:  The patient's family history includes Diabetes in his maternal grandfather; Heart attack in his paternal grandmother; Heart attack (age of onset: 27) in his father; Heart disease in his father; Other in his father; Pneumonia in his maternal grandmother; Stroke (age of onset: 55) in his mother.   ROS:  Please see the history of present illness.   Otherwise, review of systems is positive for DOE.   All other systems are reviewed and negative.   PHYSICAL EXAM: VS:  BP 126/64   Pulse 88   Ht 5\' 6"  (1.676 m)   Wt 149 lb (67.6 kg)   BMI 24.05 kg/m  , BMI Body mass index is 24.05 kg/m. GEN: Well nourished, well developed, in no acute distress  HEENT: normal  Neck: no JVD, carotid bruits, or masses Cardiac: RRR; no murmurs, rubs, or gallops,no edema  Respiratory:  clear to auscultation bilaterally, normal work of breathing GI: soft, nontender, nondistended, + BS MS: no deformity or atrophy  Skin: warm and dry, device site well healed Neuro:  Strength and sensation are intact Psych: euthymic  mood, full affect  EKG:  EKG is ordered today. Personal review of the ekg ordered shows AV paced  Personal review of the device interrogation today. Results in [place]: 04/30/2017: Hemoglobin 14.2; Platelets 204.0; Pro B Natriuretic peptide (BNP) 90.0 07/10/2017: ALT 20; BUN 23; Creatinine, Ser 1.34; Potassium 4.7; Sodium 141    Lipid Panel     Component Value Date/Time   CHOL 222 (H) 07/10/2017 1048   TRIG 61 07/10/2017 1048   HDL 58 07/10/2017 1048   CHOLHDL 3.8 07/10/2017 1048   CHOLHDL 5 12/26/2016 0939   VLDL 14.8 12/26/2016 0939   LDLCALC 152 (H) 07/10/2017 1048     Wt Readings from Last 3 Encounters:  02/22/18 149 lb (67.6 kg)  12/09/17 147 lb (66.7 kg)  10/08/17 148 lb (67.1 kg)      Other studies  Reviewed: Additional studies/ records that were reviewed today include: TTE 09/13/15, Coronary angiography 10/29/15  Review of the above records today demonstrates:  - Left ventricle: The cavity size was normal. Wall thickness was   increased in a pattern of mild LVH. Septal-lateral dyssynchrony   noted. Indeterminant diastolic function. Systolic function was   moderately reduced. The estimated ejection fraction was in the   range of 35% to 40%. Diffuse hypokinesis. - Aortic valve: Bioprosthetic aortic valve s/p TAVR. No significant   stenosis. Trivial peri-valvular regurgitation. Mean gradient (S):   12 mm Hg. - Mitral valve: There was trivial regurgitation. - Right ventricle: The cavity size was normal. Pacer wire or   catheter noted in right ventricle. Systolic function was mildly   reduced. - Pulmonary arteries: No complete TR doppler jet so unable to   estimate PA systolic pressure. - Inferior vena cava: The vessel was normal in size. The   respirophasic diameter changes were in the normal range (= 50%),   consistent with normal central venous pressure.   Prox RCA to Mid RCA lesion, 100 %stenosed.  Ost LAD to Prox LAD lesion, 100 %stenosed.  Ost LM lesion, 80 %stenosed.  Ost Cx to Prox Cx lesion, 90 %stenosed.  Origin to Prox Graft lesion, 100 %stenosed.   ASSESSMENT AND PLAN:  1.  Complete AV block: Status post dual-chamber pacemaker upgrade to Medtronic CRT-P 66/44/0347 due to systolic heart failure.  Device functioning appropriately.  No changes.  2.  Chronic Systolc heart failure: No obvious signs of volume overload. 3. Aortic stenosis: Status post TAVR 2013  4. CAD s/p CABG: No chest pain.  5. Hypertension: Well-controlled.  No changes.  Current medicines are reviewed at length with the patient today.   The patient has concerns regarding his medicines.  The following changes were made today:  none  Labs/ tests ordered today include:  Orders Placed This Encounter   Procedures  . CUP PACEART [place]  . EKG 12-Lead     Disposition:   FU with Marquez Ceesay 12 months  Signed, Steven Basso Meredith Leeds, MD  02/22/2018 1:40 PM     [address] [place] [address] [phone] (office) [phone] (fax)

## 2018-02-22 NOTE — Patient Instructions (Addendum)
Medication Instructions:  Your physician recommends that you continue on your current medications as directed. Please refer to the Current Medication list given to you today.  *If you need a refill on your cardiac medications before your next appointment, please call your pharmacy*  Labwork: None ordered  Testing/Procedures: None ordered  Follow-Up: Remote monitoring is used to monitor your Pacemaker or ICD from home. This monitoring reduces the number of office visits required to check your device to one time per year. It allows [place] to keep an eye on the functioning of your device to ensure it is working properly. You are scheduled for a device check from home on 04/13/2018. You may send your transmission at any time that day. If you have a wireless device, the transmission will be sent automatically. After your physician reviews your transmission, you will receive a postcard with your next transmission date.  Your physician wants you to follow-up in: 1 year with Dr. Curt Bears.  You will receive a reminder letter in the mail two months in advance. If you don't receive a letter, please call our office to schedule the follow-up appointment.  Thank you for choosing [facility]!!   Trinidad Curet, RN [phone]

## 2018-02-25 ENCOUNTER — Other Ambulatory Visit: Payer: Self-pay | Admitting: Cardiovascular Disease

## 2018-03-05 ENCOUNTER — Telehealth: Payer: Self-pay | Admitting: Internal Medicine

## 2018-03-05 MED ORDER — ALBUTEROL SULFATE HFA 108 (90 BASE) MCG/ACT IN AERS: INHALATION_SPRAY | RESPIRATORY_TRACT | 0 refills | Status: DC

## 2018-03-05 NOTE — Telephone Encounter (Signed)
Spoke with pt. He is requesting a refill on Ventolin HFA. Advised him that I would send in #1 inhaler as MW told him to follow up prn. Pt is aware that this PCP will need to take over this prescription or he will need to come in for an OV for MW to continue filling this prescription. He agreed and verbalized understanding. Nothing further was needed.

## 2018-03-07 ENCOUNTER — Other Ambulatory Visit: Payer: Self-pay | Admitting: Cardiovascular Disease

## 2018-03-24 ENCOUNTER — Telehealth: Payer: Self-pay | Admitting: Adult Health

## 2018-03-24 NOTE — Telephone Encounter (Signed)
Ok to refill x  3 month but will need ov by end of 3 month supply to work out long term plan for refills

## 2018-03-24 NOTE — Telephone Encounter (Signed)
Pt need a refill on 2 Rx's for albuterol (VENTOLIN HFA) 108 and budesonide-formoterol (SYMBICORT) 160-4.5 MCG.   Pharm:   [facility].   Pt would like to have a call when this medication is sent in and state that he only has a 1/2 days worth.

## 2018-03-24 NOTE — Telephone Encounter (Signed)
Pt needs CPX and lab work. Message left for the pt to return my call.  CRM has been created.

## 2018-03-24 NOTE — Telephone Encounter (Signed)
Reviewed chart and realized these medications are filled by Dr. Melvyn Novas.  Will forward.

## 2018-03-26 ENCOUNTER — Other Ambulatory Visit: Payer: Self-pay | Admitting: Internal Medicine

## 2018-03-29 DIAGNOSIS — H35032 Hypertensive retinopathy, left eye: Secondary | ICD-10-CM | POA: Diagnosis not present

## 2018-03-29 DIAGNOSIS — H35042 Retinal micro-aneurysms, unspecified, left eye: Secondary | ICD-10-CM | POA: Diagnosis not present

## 2018-03-29 DIAGNOSIS — H34812 Central retinal vein occlusion, left eye, with macular edema: Secondary | ICD-10-CM | POA: Diagnosis not present

## 2018-03-29 DIAGNOSIS — H35352 Cystoid macular degeneration, left eye: Secondary | ICD-10-CM | POA: Diagnosis not present

## 2018-04-09 ENCOUNTER — Encounter: Payer: Medicare Other | Admitting: Adult Health

## 2018-04-13 ENCOUNTER — Ambulatory Visit (INDEPENDENT_AMBULATORY_CARE_PROVIDER_SITE_OTHER): Payer: Medicare Other | Admitting: *Deleted

## 2018-04-13 DIAGNOSIS — I442 Atrioventricular block, complete: Secondary | ICD-10-CM | POA: Diagnosis not present

## 2018-04-14 ENCOUNTER — Ambulatory Visit (INDEPENDENT_AMBULATORY_CARE_PROVIDER_SITE_OTHER): Payer: Medicare Other | Admitting: Adult Health

## 2018-04-14 ENCOUNTER — Encounter: Payer: Self-pay | Admitting: Adult Health

## 2018-04-14 VITALS — BP 148/80 | Temp 97.8°F | Ht 65.5 in | Wt 147.0 lb

## 2018-04-14 DIAGNOSIS — I5022 Chronic systolic (congestive) heart failure: Secondary | ICD-10-CM | POA: Diagnosis not present

## 2018-04-14 DIAGNOSIS — I10 Essential (primary) hypertension: Secondary | ICD-10-CM

## 2018-04-14 DIAGNOSIS — E7800 Pure hypercholesterolemia, unspecified: Secondary | ICD-10-CM | POA: Diagnosis not present

## 2018-04-14 DIAGNOSIS — J449 Chronic obstructive pulmonary disease, unspecified: Secondary | ICD-10-CM

## 2018-04-14 DIAGNOSIS — I25810 Atherosclerosis of coronary artery bypass graft(s) without angina pectoris: Secondary | ICD-10-CM

## 2018-04-14 DIAGNOSIS — I442 Atrioventricular block, complete: Secondary | ICD-10-CM

## 2018-04-14 LAB — CBC WITH DIFFERENTIAL/PLATELET
Basophils Absolute: 0.1 10*3/uL (ref 0.0–0.1)
Basophils Relative: 0.9 % (ref 0.0–3.0)
Eosinophils Absolute: 0.3 10*3/uL (ref 0.0–0.7)
Eosinophils Relative: 3 % (ref 0.0–5.0)
HEMATOCRIT: 43.6 % (ref 39.0–52.0)
HEMOGLOBIN: 15 g/dL (ref 13.0–17.0)
Lymphocytes Relative: 14.7 % (ref 12.0–46.0)
Lymphs Abs: 1.4 10*3/uL (ref 0.7–4.0)
MCHC: 34.5 g/dL (ref 30.0–36.0)
MCV: 94.1 fl (ref 78.0–100.0)
Monocytes Absolute: 1 10*3/uL (ref 0.1–1.0)
Monocytes Relative: 10.8 % (ref 3.0–12.0)
Neutro Abs: 6.5 10*3/uL (ref 1.4–7.7)
Neutrophils Relative %: 70.6 % (ref 43.0–77.0)
Platelets: 198 10*3/uL (ref 150.0–400.0)
RBC: 4.63 Mil/uL (ref 4.22–5.81)
RDW: 13.3 % (ref 11.5–15.5)
WBC: 9.2 10*3/uL (ref 4.0–10.5)

## 2018-04-14 LAB — CUP PACEART REMOTE DEVICE CHECK
Battery Remaining Longevity: 13 mo
Brady Statistic AP VP Percent: 86.28 %
Brady Statistic AP VS Percent: 0.05 %
Brady Statistic AS VP Percent: 13.41 %
Brady Statistic AS VS Percent: 0.27 %
Brady Statistic RA Percent Paced: 86.2 %
Brady Statistic RV Percent Paced: 99.68 %
Date Time Interrogation Session: 20200303042312
Implantable Lead Implant Date: 20131002
Implantable Lead Implant Date: 20131002
Implantable Lead Implant Date: 20171019
Implantable Lead Location: 753858
Implantable Lead Location: 753859
Implantable Lead Location: 753860
Implantable Lead Model: 4298
Implantable Lead Model: 5076
Implantable Pulse Generator Implant Date: 20171019
Lead Channel Impedance Value: 342 Ohm
Lead Channel Impedance Value: 342 Ohm
Lead Channel Impedance Value: 361 Ohm
Lead Channel Impedance Value: 399 Ohm
Lead Channel Impedance Value: 437 Ohm
Lead Channel Impedance Value: 456 Ohm
Lead Channel Impedance Value: 494 Ohm
Lead Channel Impedance Value: 551 Ohm
Lead Channel Impedance Value: 684 Ohm
Lead Channel Impedance Value: 684 Ohm
Lead Channel Impedance Value: 893 Ohm
Lead Channel Pacing Threshold Amplitude: 0.875 V
Lead Channel Pacing Threshold Amplitude: 2.25 V
Lead Channel Pacing Threshold Pulse Width: 0.4 ms
Lead Channel Pacing Threshold Pulse Width: 1 ms
Lead Channel Sensing Intrinsic Amplitude: 1.625 mV
Lead Channel Setting Pacing Amplitude: 2 V
Lead Channel Setting Pacing Amplitude: 2.5 V
Lead Channel Setting Pacing Amplitude: 3.75 V
Lead Channel Setting Pacing Pulse Width: 0.4 ms
Lead Channel Setting Pacing Pulse Width: 1 ms
Lead Channel Setting Sensing Sensitivity: 4 mV
MDC IDC MSMT BATTERY VOLTAGE: 2.88 V
MDC IDC MSMT LEADCHNL LV IMPEDANCE VALUE: 456 Ohm
MDC IDC MSMT LEADCHNL LV IMPEDANCE VALUE: 798 Ohm
MDC IDC MSMT LEADCHNL LV IMPEDANCE VALUE: 893 Ohm
MDC IDC MSMT LEADCHNL RA SENSING INTR AMPL: 1.625 mV

## 2018-04-14 LAB — LIPID PANEL
CHOLESTEROL: 164 mg/dL (ref 0–200)
HDL: 53.6 mg/dL (ref >39.00)
LDL Cholesterol: 98 mg/dL (ref 0–99)
NonHDL: 110.43
Total CHOL/HDL Ratio: 3
Triglycerides: 62 mg/dL (ref 0.0–149.0)
VLDL: 12.4 mg/dL (ref 0.0–40.0)

## 2018-04-14 LAB — COMPREHENSIVE METABOLIC PANEL
ALT: 24 U/L (ref 0–53)
AST: 26 U/L (ref 0–37)
Albumin: 4 g/dL (ref 3.5–5.2)
Alkaline Phosphatase: 113 U/L (ref 39–117)
BUN: 26 mg/dL — ABNORMAL HIGH (ref 6–23)
CO2: 31 mEq/L (ref 19–32)
Calcium: 9.2 mg/dL (ref 8.4–10.5)
Chloride: 103 mEq/L (ref 96–112)
Creatinine, Ser: 1.24 mg/dL (ref 0.40–1.50)
GFR: 55 mL/min — AB (ref >60.00)
Glucose, Bld: 77 mg/dL (ref 70–99)
Potassium: 4.5 mEq/L (ref 3.5–5.1)
Sodium: 141 mEq/L (ref 135–145)
Total Bilirubin: 0.6 mg/dL (ref 0.2–1.2)
Total Protein: 6.7 g/dL (ref 6.0–8.3)

## 2018-04-14 LAB — TSH: TSH: 1.99 u[IU]/mL (ref 0.35–4.50)

## 2018-04-14 NOTE — Progress Notes (Signed)
Subjective:    Patient ID: Luis Townsend, male    DOB: Mar 14, 1928, 85 y.o.   MRN: [id number]  HPI Patient presents for yearly preventative medicine examination. He is a pleasant 85 year old male who  has a past medical history of Amaurosis fugax of left eye (10/11/1996), Aortic stenosis (06/30/2011), Aortoiliac occlusive disease ([place]) (06/30/2011), Cardiac resynchronization therapy pacemaker (CRT-P) in place, Cerebrovascular disease (06/30/2011), CHB (complete heart block) ([place]) (02/03/2013), COPD (chronic obstructive pulmonary disease), moderat to severe (07/29/2011), Coronary artery disease, Hyperlipidemia, Hypertension, Myocardial infarction ([place]) (03/22/1994), PAD (peripheral artery disease) (HCC), RBBB (right bundle branch block), S/P CABG x 5 (03/28/1994), S/P carotid endarterectomy (10/28/1996), Subclavian artery stenosis, left ([place]) (06/30/2011), and Tobacco abuse.   COPD GOLD II -is followed by pulmonary, Dr. Melvyn Novas.  Currently controlled with inhalers  Sinus bradycardia and complete heart block, is pacemaker dependent.  Followed by Dr. Curt Bears.  CAD, status post CABG, AS s/p TAVR, PAD multiple vessel involvement.-followed by Dr. Sallyanne Kuster.Currently prescribed Zetia, Coreg Crestor 40 mg, and 81 mg aspirin daily  HTN - Controlled with Avapro and Coreg BP Readings from Last 3 Encounters:  04/14/18 (!) 148/80  02/22/18 126/64  12/09/17 138/60    All immunizations and health maintenance protocols were reviewed with the patient and needed orders were placed. UTD on vaccinations   Appropriate screening laboratory values were ordered for the patient including screening of hyperlipidemia, renal function and hepatic function.  Medication reconciliation,  past medical history, social history, problem list and allergies were reviewed in detail with the patient  Goals were established with regard to weight loss, exercise, and  diet in compliance with medications.  Continues to stay active, over the fall  he completed reconstruction of a [address] year old barn that is on his property.  End of life planning was discussed.  He has an advanced directive and living will  He has no acute complaints. Reports " I am feeling pretty good. I continue to do what I want to do and don't have any issues"   Review of Systems  Constitutional: Negative.   HENT: Negative.   Eyes: Negative.   Respiratory: Negative.   Cardiovascular: Negative.   Gastrointestinal: Negative.   Genitourinary: Negative.   Neurological: Negative.   Hematological: Negative.   Psychiatric/Behavioral: Negative.   All other systems reviewed and are negative.  Past Medical History:  Diagnosis Date  . Amaurosis fugax of left eye 10/11/1996  . Aortic stenosis 06/30/2011   severe  . Aortoiliac occlusive disease ([place]) 06/30/2011  . Cardiac resynchronization therapy pacemaker (CRT-P) in place    a. 11/29/15:  Medtronic model O8NO67 Percepta Quad CRT-P MRI SureScan (serial Number [id number] )  . Cerebrovascular disease 06/30/2011  . CHB (complete heart block) ([place]) 02/03/2013  . COPD (chronic obstructive pulmonary disease), moderat to severe 07/29/2011  . Coronary artery disease    a. 10/29/15 Cath for decreased EF with patent 2/3 grafts, no change   . Hyperlipidemia   . Hypertension   . Myocardial infarction ([place]) 03/22/1994   acute LAD occlusion  . PAD (peripheral artery disease) ([place])   . RBBB (right bundle branch block)   . S/P CABG x 5 03/28/1994   LIMA to LAD, sequential SVG to OM1-OM2-OM4, SVG to RCA, open vein harvest right leg - Dr Redmond Pulling  . S/P carotid endarterectomy 10/28/1996   Dr Donnetta Hutching  . Subclavian artery stenosis, left ([place]) 06/30/2011  . Tobacco abuse     Social History   Socioeconomic History  . Marital status:  Married    Spouse name: Not on file  . Number of children: Not on file  . Years of education: Not on file  . Highest education level: Not on file  Occupational History  . Not on file  Social Needs  .  Financial resource strain: Not on file  . Food insecurity:    Worry: Not on file    Inability: Not on file  . Transportation needs:    Medical: Not on file    Non-medical: Not on file  Tobacco Use  . Smoking status: Former Smoker    Packs/day: 1.00    Years: 60.00    Pack years: 60.00    Types: Cigarettes    Last attempt to quit: 03/02/2011    Years since quitting: 7.1  . Smokeless tobacco: Never Used  Substance and Sexual Activity  . Alcohol use: No    Alcohol/week: 0.0 standard drinks  . Drug use: No  . Sexual activity: Not on file  Lifestyle  . Physical activity:    Days per week: Not on file    Minutes per session: Not on file  . Stress: Not on file  Relationships  . Social connections:    Talks on phone: Not on file    Gets together: Not on file    Attends religious service: Not on file    Active member of club or organization: Not on file    Attends meetings of clubs or organizations: Not on file    Relationship status: Not on file  . Intimate partner violence:    Fear of current or ex partner: Not on file    Emotionally abused: Not on file    Physically abused: Not on file    Forced sexual activity: Not on file  Other Topics Concern  . Not on file  Social History Narrative   Married for 63 years   Three children -    1. Son in [place]   2. Daughter in [place]   3.Son in [place]      Two grandchildren        Past Surgical History:  Procedure Laterality Date  . BILATERAL UPPER EXTREMITY ANGIOGRAM N/A 07/29/2011   Procedure: BILATERAL UPPER EXTREMITY ANGIOGRAM;  Surgeon: Lorretta Harp, MD;  Location: [facility] CATH LAB;  Service: Cardiovascular;  Laterality: N/A;  . CARDIAC CATHETERIZATION  06/24/2011   No intervention - Recommendation-angioplasty/stenting of Lft subclavian artery, high risk replacement of aortic valve w/ biological prosthesis and redo CABG w/ preservation of LIMA bypass and new SVG to PDA and OM, reevaluate carotid arteries and pulmonary function prior to  surgery.  Marland Kitchen CARDIAC CATHETERIZATION N/A 10/29/2015   Procedure: Coronary/Graft Angiography;  Surgeon: Lorretta Harp, MD;  Location: [facility] CV LAB;  Service: Cardiovascular;  Laterality: N/A;  . CARDIAC VALVE REPLACEMENT    . CARDIOVASCULAR STRESS TEST  07/10/2009   Evidence of mild ischemia in Basal Anteriolateral and Mid Anterolateral regions-additional diaphragmatic attenuation. No ECG changes. Low risk.\  . CAROTID DOPPLER  10/18/2012   Rt bulb/prox ICA demonstrated 50-69% diameter reduction, Lft mid ICA demonstrated 50-69% diameter reduction, Lft subclavian- = to or <50% sig diameter reduction, Lft vertebral-appeared occluded  . CAROTID ENDARTERECTOMY  10/28/96  DR. TODD EARLY   LEFT AND DACRON PATCH ANGIOPLASTY  . CATARACT EXTRACTION Right 2017  . CORONARY ARTERY BYPASS GRAFT  03/28/94 DR. CHARLES WILSON   x5(LIMA to LAD,SVG to OM1-OM2-OM4, SVG to RCA  . EP IMPLANTABLE DEVICE N/A 11/29/2015  Procedure: BiV Upgrade;  Surgeon: Will Meredith Leeds, MD;  Location: [facility] CV LAB;  Service: Cardiovascular;  Laterality: N/A;  . EYE SURGERY    . LEFT HEART CATHETERIZATION WITH CORONARY/GRAFT ANGIOGRAM N/A 06/24/2011   Procedure: LEFT HEART CATHETERIZATION WITH Beatrix Fetters;  Surgeon: Sanda Klein, MD;  Location: [facility] CATH LAB;  Service: Cardiovascular;  Laterality: N/A;  . PACEMAKER INSERTION  11/12/2011   Medtronic Adapta; model# ADDR01, serial# [id number]  . PERIPHERAL VASCULAR ANGIOPLASTY  07/29/2011   Lft Subclavian 90% stenosis, stented w/ a 7x61mm long Cordis Genesis resulting in reduction of 90% stenosos to 0% resdiual.  . TRANSTHORACIC ECHOCARDIOGRAM  08/05/2012   EF 50-55%, septial motion showed abnormal function and dyssynergy, LA mild-moderately dilated    Family History  Problem Relation Age of Onset  . Stroke Mother 37       died of stroke  . Heart attack Father 67       did not drink  . Heart disease Father   . Other Father        polio  . Pneumonia  Maternal Grandmother   . Diabetes Maternal Grandfather   . Heart attack Paternal Grandmother     No Known Allergies  Current Outpatient Medications on File Prior to Visit  Medication Sig Dispense Refill  . albuterol (VENTOLIN HFA) 108 (90 Base) MCG/ACT inhaler INHALE 2 PUFFS BY MOUTH EVERY 4 HOURS AS NEEDED 1 Inhaler 0  . aspirin EC 81 MG tablet Take 81 mg by mouth daily.    . budesonide-formoterol (SYMBICORT) 160-4.5 MCG/ACT inhaler Inhale 2 puffs into the lungs 2 (two) times daily. 1 Inhaler 11  . carvedilol (COREG) 6.25 MG tablet TAKE 1 TABLET BY MOUTH TWICE A DAY WITH A MEAL 180 tablet 3  . ezetimibe (ZETIA) 10 MG tablet Take 1 tablet (10 mg total) by mouth daily. 90 tablet 1  . furosemide (LASIX) 20 MG tablet TAKE 1 TABLET BY MOUTH EVERY DAY 90 tablet 1  . irbesartan (AVAPRO) 75 MG tablet Take 1 tablet (75 mg total) daily by mouth. 90 tablet 1  . rosuvastatin (CRESTOR) 40 MG tablet TAKE 1 TABLET BY MOUTH EVERY DAY 90 tablet 1  . tadalafil (CIALIS) 5 MG tablet Take 1 tablet (5 mg total) by mouth daily as needed for erectile dysfunction. 10 tablet 0   No current facility-administered medications on file prior to visit.     BP (!) 148/80   Temp 97.8 F (36.6 C)   Ht 5' 5.5" (1.664 m)   Wt 147 lb (66.7 kg)   BMI 24.09 kg/m       Objective:   Physical Exam Vitals signs and nursing note reviewed.  Constitutional:      Appearance: Normal appearance. He is normal weight.  HENT:     Head: Normocephalic and atraumatic.     Right Ear: Tympanic membrane, ear canal and external ear normal. There is no impacted cerumen.     Left Ear: Tympanic membrane, ear canal and external ear normal. There is no impacted cerumen.     Nose: Nose normal. No congestion.     Mouth/Throat:     Mouth: Mucous membranes are moist.     Pharynx: Oropharynx is clear.  Eyes:     Extraocular Movements: Extraocular movements intact.     Conjunctiva/sclera: Conjunctivae normal.     Pupils: Pupils are  equal, round, and reactive to light.  Neck:     Musculoskeletal: Normal range of motion and neck supple. No neck  rigidity.     Vascular: No carotid bruit.  Cardiovascular:     Rate and Rhythm: Normal rate and regular rhythm.     Pulses: Normal pulses.     Heart sounds: Normal heart sounds. No murmur. No friction rub. No gallop.   Pulmonary:     Effort: Pulmonary effort is normal.     Breath sounds: Normal breath sounds.  Abdominal:     General: Abdomen is flat. Bowel sounds are normal. There is no distension.     Palpations: Abdomen is soft. There is no mass.     Tenderness: There is no abdominal tenderness. There is no left CVA tenderness, guarding or rebound.     Hernia: No hernia is present.  Musculoskeletal: Normal range of motion.        General: No swelling, tenderness, deformity or signs of injury.     Right lower leg: No edema.     Left lower leg: No edema.  Lymphadenopathy:     Cervical: No cervical adenopathy.  Skin:    General: Skin is warm.     Capillary Refill: Capillary refill takes less than 2 seconds.     Coloration: Skin is not jaundiced or pale.     Findings: No bruising, erythema, lesion or rash.  Neurological:     General: No focal deficit present.     Mental Status: He is alert and oriented to person, place, and time. Mental status is at baseline.  Psychiatric:        Mood and Affect: Mood normal.        Behavior: Behavior normal.        Thought Content: Thought content normal.        Judgment: Judgment normal.       Assessment & Plan:  1. Chronic systolic CHF (congestive heart failure) (HCC) - No signs of fluid overload. Continue with lasix and cardiology plan of care - CBC with Differential/Platelet - Comprehensive metabolic panel - Lipid panel - TSH  2. COPD GOLD II - Controlled. Continue with inhalers  - CBC with Differential/Platelet - Comprehensive metabolic panel - Lipid panel - TSH  3. Essential hypertension - Well controlled. No  changes in medications.  - CBC with Differential/Platelet - Comprehensive metabolic panel - Lipid panel - TSH  4. Pure hypercholesterolemia - Continue with statin and ASA - CBC with Differential/Platelet - Comprehensive metabolic panel - Lipid panel - TSH  5. CHB (complete heart block) ([place]) - Follow up with Cardiology as directed  - CBC with Differential/Platelet - Comprehensive metabolic panel - Lipid panel - TSH  Dorothyann Peng, NP

## 2018-04-21 ENCOUNTER — Encounter: Payer: Self-pay | Admitting: Cardiology

## 2018-04-21 NOTE — Progress Notes (Signed)
Remote pacemaker transmission.   

## 2018-05-25 DIAGNOSIS — B353 Tinea pedis: Secondary | ICD-10-CM | POA: Diagnosis not present

## 2018-06-11 ENCOUNTER — Other Ambulatory Visit: Payer: Self-pay

## 2018-06-11 ENCOUNTER — Other Ambulatory Visit: Payer: Self-pay | Admitting: Adult Health

## 2018-06-11 MED ORDER — EZETIMIBE 10 MG PO TABS: 10.0000 mg | ORAL_TABLET | Freq: Every day | ORAL | 1 refills | Status: DC

## 2018-06-11 MED ORDER — FUROSEMIDE 20 MG PO TABS: 20.0000 mg | ORAL_TABLET | Freq: Every day | ORAL | 1 refills | Status: DC

## 2018-06-14 DIAGNOSIS — H3561 Retinal hemorrhage, right eye: Secondary | ICD-10-CM | POA: Diagnosis not present

## 2018-06-14 DIAGNOSIS — H35352 Cystoid macular degeneration, left eye: Secondary | ICD-10-CM | POA: Diagnosis not present

## 2018-06-14 DIAGNOSIS — H43822 Vitreomacular adhesion, left eye: Secondary | ICD-10-CM | POA: Diagnosis not present

## 2018-06-14 DIAGNOSIS — H34812 Central retinal vein occlusion, left eye, with macular edema: Secondary | ICD-10-CM | POA: Diagnosis not present

## 2018-06-15 NOTE — Telephone Encounter (Signed)
Please find out if he is still using this. If he is then he can have 90 days. Lets get him on the scheduled for a CPE in July

## 2018-07-09 ENCOUNTER — Other Ambulatory Visit: Payer: Self-pay | Admitting: Internal Medicine

## 2018-07-10 ENCOUNTER — Other Ambulatory Visit: Payer: Self-pay | Admitting: Pulmonary Disease

## 2018-07-10 MED ORDER — BUDESONIDE-FORMOTEROL FUMARATE 160-4.5 MCG/ACT IN AERO: 2.0000 | INHALATION_SPRAY | Freq: Two times a day (BID) | RESPIRATORY_TRACT | 11 refills | Status: DC

## 2018-07-10 MED ORDER — ALBUTEROL SULFATE HFA 108 (90 BASE) MCG/ACT IN AERS: INHALATION_SPRAY | RESPIRATORY_TRACT | 3 refills | Status: DC

## 2018-07-10 NOTE — Progress Notes (Signed)
Patient called for medication refill  Albuterol and Symbicort sent into pharmacy

## 2018-07-13 ENCOUNTER — Ambulatory Visit (INDEPENDENT_AMBULATORY_CARE_PROVIDER_SITE_OTHER): Payer: Medicare Other | Admitting: *Deleted

## 2018-07-13 DIAGNOSIS — I442 Atrioventricular block, complete: Secondary | ICD-10-CM

## 2018-07-14 ENCOUNTER — Telehealth: Payer: Self-pay

## 2018-07-14 NOTE — Telephone Encounter (Signed)
Spoke with patient to remind of missed remote transmission 

## 2018-07-15 LAB — CUP PACEART REMOTE DEVICE CHECK
Battery Remaining Longevity: 11 mo
Battery Voltage: 2.85 V
Brady Statistic AP VP Percent: 89.97 %
Brady Statistic AP VS Percent: 0.05 %
Brady Statistic AS VP Percent: 9.72 %
Brady Statistic AS VS Percent: 0.26 %
Brady Statistic RA Percent Paced: 89.95 %
Brady Statistic RV Percent Paced: 99.69 %
Date Time Interrogation Session: 20200604045709
Implantable Lead Implant Date: 20131002
Implantable Lead Implant Date: 20131002
Implantable Lead Implant Date: 20171019
Implantable Lead Location: 753858
Implantable Lead Location: 753859
Implantable Lead Location: 753860
Implantable Lead Model: 4298
Implantable Lead Model: 5076
Implantable Lead Model: 5076
Implantable Pulse Generator Implant Date: 20171019
Lead Channel Impedance Value: 342 Ohm
Lead Channel Impedance Value: 380 Ohm
Lead Channel Impedance Value: 418 Ohm
Lead Channel Impedance Value: 418 Ohm
Lead Channel Impedance Value: 456 Ohm
Lead Channel Impedance Value: 513 Ohm
Lead Channel Impedance Value: 513 Ohm
Lead Channel Impedance Value: 532 Ohm
Lead Channel Impedance Value: 589 Ohm
Lead Channel Impedance Value: 684 Ohm
Lead Channel Impedance Value: 760 Ohm
Lead Channel Impedance Value: 817 Ohm
Lead Channel Impedance Value: 893 Ohm
Lead Channel Impedance Value: 950 Ohm
Lead Channel Pacing Threshold Amplitude: 0.875 V
Lead Channel Pacing Threshold Amplitude: 2.125 V
Lead Channel Pacing Threshold Pulse Width: 0.4 ms
Lead Channel Pacing Threshold Pulse Width: 1 ms
Lead Channel Sensing Intrinsic Amplitude: 2.125 mV
Lead Channel Sensing Intrinsic Amplitude: 2.125 mV
Lead Channel Setting Pacing Amplitude: 2 V
Lead Channel Setting Pacing Amplitude: 2.5 V
Lead Channel Setting Pacing Amplitude: 3.75 V
Lead Channel Setting Pacing Pulse Width: 0.4 ms
Lead Channel Setting Pacing Pulse Width: 1 ms
Lead Channel Setting Sensing Sensitivity: 4 mV

## 2018-07-21 ENCOUNTER — Encounter: Payer: Self-pay | Admitting: Cardiology

## 2018-07-21 NOTE — Progress Notes (Signed)
Remote pacemaker transmission.   

## 2018-08-02 DIAGNOSIS — H34812 Central retinal vein occlusion, left eye, with macular edema: Secondary | ICD-10-CM | POA: Diagnosis not present

## 2018-08-02 DIAGNOSIS — H35352 Cystoid macular degeneration, left eye: Secondary | ICD-10-CM | POA: Diagnosis not present

## 2018-08-02 DIAGNOSIS — H43822 Vitreomacular adhesion, left eye: Secondary | ICD-10-CM | POA: Diagnosis not present

## 2018-08-02 DIAGNOSIS — H35032 Hypertensive retinopathy, left eye: Secondary | ICD-10-CM | POA: Diagnosis not present

## 2018-08-11 DIAGNOSIS — H353133 Nonexudative age-related macular degeneration, bilateral, advanced atrophic without subfoveal involvement: Secondary | ICD-10-CM | POA: Diagnosis not present

## 2018-08-11 DIAGNOSIS — H348122 Central retinal vein occlusion, left eye, stable: Secondary | ICD-10-CM | POA: Diagnosis not present

## 2018-08-11 DIAGNOSIS — Z961 Presence of intraocular lens: Secondary | ICD-10-CM | POA: Diagnosis not present

## 2018-08-11 DIAGNOSIS — H25812 Combined forms of age-related cataract, left eye: Secondary | ICD-10-CM | POA: Diagnosis not present

## 2018-08-17 ENCOUNTER — Other Ambulatory Visit: Payer: Self-pay

## 2018-08-17 ENCOUNTER — Encounter: Payer: Self-pay | Admitting: Adult Health

## 2018-08-17 ENCOUNTER — Telehealth: Payer: Self-pay | Admitting: Internal Medicine

## 2018-08-17 ENCOUNTER — Ambulatory Visit (INDEPENDENT_AMBULATORY_CARE_PROVIDER_SITE_OTHER): Payer: Medicare Other | Admitting: Adult Health

## 2018-08-17 DIAGNOSIS — R0609 Other forms of dyspnea: Secondary | ICD-10-CM

## 2018-08-17 DIAGNOSIS — J449 Chronic obstructive pulmonary disease, unspecified: Secondary | ICD-10-CM | POA: Diagnosis not present

## 2018-08-17 NOTE — Patient Instructions (Addendum)
Continue on Symbicort 2 puffs Twice daily  , rinse after use Fluids and rest .  Use albuterol Inhaler 2 puffs every 4-6 hr as needed for wheezing /dypsnea.  If symptoms are not improving or worsen need sooner follow up  Follow up in 4 weeks with Dr. Melvyn Novas  Or Shelley Cocke NP  Please contact office for sooner follow up if symptoms do not improve or worsen or seek emergency care

## 2018-08-17 NOTE — Progress Notes (Signed)
Virtual Visit via Telephone Note  I connected with Luis Townsend on 08/17/18 at  4:00 PM EDT by telephone and verified that I am speaking with the correct person using two identifiers.  Location: Patient: Home  Provider: Office    I discussed the limitations, risks, security and privacy concerns of performing an evaluation and management service by telephone and the availability of in person appointments. I also discussed with the patient that there may be a patient responsible charge related to this service. The patient expressed understanding and agreed to proceed.   History of Present Illness: 85 yo male former smoker followed for COPD -GOLD II   Today's televisit is for an acute office visit . He complains of last 1 day of increased dyspnea. Today at lunch time , noticed he was more short of breath while eating lunch. Felt weak and fatigued. Went to take a nap , but when he got up still felt tired and more short of breath, hard to take deep breath. No fever, discolored mucus or cough . No sinus drainage . Remains on Symbicort. Tried the albuterol and seemed to help some.  No orthopnea or increased leg swelling. No weight gain . No chest pain .  No recent travel or known sick contacts . No wheezing.  Appetite is good.  No bloody stools . No calf pain .  Feels better this evening, best he has felt all day . Says other than some dyspnea feels good.                                                                                                             Observations/Objective: PFT's  07/09/11  FEV1 1.5 (69 % ) ratio 51  p 21 % improvement from saba p ? prior to study with DLCO  64/65c % corrects to 74 % for alv volume   - 05/30/2016    try change from advair to symbicort 160 - Spirometry 05/08/2017  FEV1 0.73 (35%)  Ratio 47 ? p am symb 160 > added spiriva   - 05/26/2017  After extensive coaching inhaler device  effectiveness =    75% from a baseline of < 50%  - PFT's  07/02/2017  FEV1 0.87  (44 % ) ratio 40  p 21 % improvement from saba p 30/31 prior to study with DLCO  30/31 % corrects to 50 % for alv volume    Assessment and Plan: COPD - ? Mild flare  Dyspnea ? Etiology  No active cough /congestion . No fever or wheezing . Symptoms seems to be mild and improving with rest . No associated symptoms currently .  Advised to look out for red flags (chest pain, fever, congestion, edema, , etc ) to  contact us immediately or ER Advised to use albuterol inhaler As needed  To see if this helps some .  If not improving , worsens or does not resolve will need office visit for further evaluation  Labs reviewed from March 2020 was unrevealing.   Plan  Patient  Instructions  Continue on Symbicort 2 puffs Twice daily  , rinse after use Fluids and rest .  Use albuterol Inhaler 2 puffs every 4-6 hr as needed for wheezing /dypsnea.  If symptoms are not improving or worsen need sooner follow up  Follow up in 4 weeks with Dr. Melvyn Novas  Or Parrett NP  Please contact office for sooner follow up if symptoms do not improve or worsen or seek emergency care         Follow Up Instructions: Follow up in 4 weeks with Dr. Melvyn Novas  And As needed   Please contact office for sooner follow up if symptoms do not improve or worsen or seek emergency care     I discussed the assessment and treatment plan with the patient. The patient was provided an opportunity to ask questions and all were answered. The patient agreed with the plan and demonstrated an understanding of the instructions.   The patient was advised to call back or seek an in-person evaluation if the symptoms worsen or if the condition fails to improve as anticipated.  I provided 28 minutes of non-face-to-face time during this encounter.   Rexene Edison, NP

## 2018-08-17 NOTE — Telephone Encounter (Signed)
Spoke with patient's wife Horris Latino. She stated that the patient has had increased SOB for the past 2 days. Today it has gotten worse.   Patient has been scheduled for a televisit today at 4 with Tammy Parrett.

## 2018-08-23 NOTE — Progress Notes (Signed)
Chart and office note reviewed in detail  > agree with a/p as outlined    

## 2018-08-24 DIAGNOSIS — H2512 Age-related nuclear cataract, left eye: Secondary | ICD-10-CM | POA: Diagnosis not present

## 2018-08-30 DIAGNOSIS — H2512 Age-related nuclear cataract, left eye: Secondary | ICD-10-CM | POA: Diagnosis not present

## 2018-08-30 DIAGNOSIS — H25812 Combined forms of age-related cataract, left eye: Secondary | ICD-10-CM | POA: Diagnosis not present

## 2018-09-01 ENCOUNTER — Other Ambulatory Visit: Payer: Self-pay | Admitting: Cardiovascular Disease

## 2018-09-06 DIAGNOSIS — H2512 Age-related nuclear cataract, left eye: Secondary | ICD-10-CM | POA: Diagnosis not present

## 2018-09-06 DIAGNOSIS — R0602 Shortness of breath: Secondary | ICD-10-CM | POA: Diagnosis not present

## 2018-09-06 DIAGNOSIS — H34812 Central retinal vein occlusion, left eye, with macular edema: Secondary | ICD-10-CM | POA: Diagnosis not present

## 2018-09-10 ENCOUNTER — Encounter: Payer: Medicare Other | Admitting: Adult Health

## 2018-09-13 ENCOUNTER — Other Ambulatory Visit: Payer: Self-pay

## 2018-09-13 ENCOUNTER — Ambulatory Visit (INDEPENDENT_AMBULATORY_CARE_PROVIDER_SITE_OTHER): Payer: Medicare Other | Admitting: Internal Medicine

## 2018-09-13 ENCOUNTER — Encounter: Payer: Self-pay | Admitting: Internal Medicine

## 2018-09-13 ENCOUNTER — Telehealth: Payer: Self-pay | Admitting: Internal Medicine

## 2018-09-13 DIAGNOSIS — I25810 Atherosclerosis of coronary artery bypass graft(s) without angina pectoris: Secondary | ICD-10-CM

## 2018-09-13 DIAGNOSIS — J449 Chronic obstructive pulmonary disease, unspecified: Secondary | ICD-10-CM | POA: Diagnosis not present

## 2018-09-13 MED ORDER — TRELEGY ELLIPTA 100-62.5-25 MCG/INH IN AEPB: 1.0000 | INHALATION_SPRAY | Freq: Every day | RESPIRATORY_TRACT | 0 refills | Status: DC

## 2018-09-13 MED ORDER — TRELEGY ELLIPTA 100-62.5-25 MCG/INH IN AEPB: 1.0000 | INHALATION_SPRAY | Freq: Every day | RESPIRATORY_TRACT | 11 refills | Status: DC

## 2018-09-13 NOTE — Assessment & Plan Note (Addendum)
Quit smoking 2013 - PFT's  07/09/11  FEV1 1.5 (69 % ) ratio 51  p 21 % improvement from saba p ? prior to study with DLCO  64/65c % corrects to 74 % for alv volume   - 05/30/2016    try change from advair to symbicort 160 - Spirometry 05/08/2017  FEV1 0.73 (35%)  Ratio 47 ? p am symb 160 > added spiriva   - 05/26/2017  After extensive coaching inhaler device  effectiveness =    75% from a baseline of < 50%  - PFT's  07/02/2017  FEV1 0.87 (44 % ) ratio 40  p 21 % improvement from saba p 30/31 prior to study with DLCO  30/31 % corrects to 50 % for alv volume   - 07/02/2017    changed to stiolto 2 pffs each am > not covered - alpha one screening 07/02/2017 >>  MM  Level 167  - 10/02/2017  After extensive coaching inhaler device,  effectiveness =    90% from baseline 75% and doing fine on just symb 160 2bid so f/u prn  - 09/13/2018  After extensive coaching inhaler device,  effectiveness =    90% with elipta so try trelegy one click each am     Group D in terms of symptom/risk and laba/lama/ICS  therefore appropriate rx at this point >>>  Try trelegy   I spent extra time with pt today reviewing appropriate use of albuterol for prn use on exertion with the following points: 1) saba is for relief of sob that does not improve by walking a slower pace or resting but rather if the pt does not improve after trying this first. 2) If the pt is convinced, as many are, that saba helps recover from activity faster then it's easy to tell if this is the case by re-challenging : ie stop, take the inhaler, then p 5 minutes try the exact same activity (intensity of workload) that just caused the symptoms and see if they are substantially diminished or not after saba 3) if there is an activity that reproducibly causes the symptoms, try the saba 15 min before the activity on alternate days   If in fact the saba really does help, then fine to continue to use it prn but advised may need to look closer at the maintenance regimen  being used to achieve better control of airways disease with exertion.   I had an extended discussion with the patient reviewing all relevant studies completed to date and  lasting 15 to 20 minutes of a 25 minute visit    I performed detailed device teaching using a teach back method which extended face to face time for this visit (see above)  Each maintenance medication was reviewed in detail including emphasizing most importantly the difference between maintenance and prns and under what circumstances the prns are to be triggered using an action plan format that is not reflected in the computer generated alphabetically organized AVS which I have not found useful in most complex patients, especially with respiratory illnesses  Please see AVS for specific instructions unique to this visit that I personally wrote and verbalized to the the pt in detail and then reviewed with pt  by my nurse highlighting any  changes in therapy recommended at today's visit to their plan of care.

## 2018-09-13 NOTE — Telephone Encounter (Signed)
See avs - plan as elaborated in writing was to try trelegy sample and if doing better stay on it   Note symbicort deleted on med list so paperwork was correct on avs.

## 2018-09-13 NOTE — Progress Notes (Signed)
Subjective:     Patient ID: Luis Townsend, male   DOB: 03-12-1928,    MRN: [id number]     Brief patient profile:  34 yowm MM quit smoking  02/2011 no problem with sob/cough and new cough/sob started around 2015 and worse since  fall 2017 referred to pulmonary clinic 05/30/2016 by Dorothyann Peng  With ? Copd proved to have GOLD II evolving to III COPD,   .History of Present Illness  05/30/2016 1st Luis Townsend  On ACEi / symbicort  Chief Complaint  Patient presents with  . Pulmonary Consult    Referred by Dorothyann Peng. Pt c/o SOB for the past 2-3 yrs, worse since Jan 2018. He reports he gets SOB walking a few hundred ft, for example waslking to his mailbox. He does fine at rest. He has also had cough that comes and goes. Cough is occ prod with clear sputum. He finds it difficult to produce any sputum lately.   acutely ill in fall 2017 uri/pna and never completely recovered with persistent moderatedly severe daily cough min production and much worse doe = MMRC3 = can't walk 100 yards even at a slow pace at a flat grade s stopping due to sob   rec Plan A = Automatic =  Symbicort 160  Take 2 puffs first thing in am and then another 2 puffs about 12 hours later.  Work on inhaler technique:   Plan B = Backup Only use your albuterol (ventolin)  stop altace and start avapro 150 mg one daily - break in half if too strong     07/11/2016  f/u ov/Luis Townsend re:  COPD  GOLD II / symb 160 2bid / off acei x 6 weeks  Chief Complaint  Patient presents with  . Follow-up    Breathing has improved and he is coughing less. He is using ventolin hfa 3-4 x per wk on average.     Less albuterol now, sleeping fine s am exac  Doe = MMRC2 = can't walk a nl pace on a flat grade s sob but does fine slow and flat  rec When you finish the present symbicort 160 change to 80 2 pffs every 12 hours  Work on inhaler technique:  Remember to pace yourself    04/30/17  Continue on Symbicort Twice daily ,  rinse after use  Begin Spiriva 2 puffs daily, rinse after use.  Chest x-ray today    05/26/2017  f/u ov/Luis Townsend re:  COPD II vs III only a little better p added spiriva but can't afford it/ suboptimal hfa  Chief Complaint  Patient presents with  . Follow-up    Breathing has only improved minimally. He has occ dry cough.  He is using his albuterol inhaler once daily on average.    Dyspnea:  Mailbox and back is flat 200 ft and stops =  MMRC3 = can't walk 100 yards even at a slow pace at a flat grade s stopping due to sob   Cough: no Sleep: ok  SABA use:  Once in pm rec Plan A = Automatic = Symbicort 160 Take 2 puffs first thing in am and then another 2 puffs about 12 hours later.  Work on inhaler technique: Plan B = Backup Only use your albuterol as a rescue medication  do alpha one screening to be complete     07/02/2017  f/u ov/Luis Townsend re: copd GOLD III  maint on symb/spiriva Chief Complaint  Patient presents with  .  Follow-up    PFT's done today. Breathing is doing some better. He is coughing less. He is producing some minimal clear sputum.     Dyspnea:  Mailbox slow but not stopping now Cough: no Sleep: ok  SABA use: p ex only  rec Plan A = Automatic = stiolto 2 pffs each am in place symbicort/ spriva Work on inhaler technique:  relax and gently blow all the way out then take a nice smooth deep breath back in, triggering the inhaler at same time you start breathing in.  Hold for up to 5 seconds if you can.   Rinse and gargle with water when done Plan B = Backup = proair   10/02/2017  f/u ov/Luis Townsend re: COPD  GOLD III - on just maint symbicort  Chief Complaint  Patient presents with  . Follow-up    feels the same as last ov  Dyspnea:  No change since off spiriva  Cough: less now Sleeping: prefers on L side one pillow SABA use:  2 puffs daily at most  02: none rec Plan A = Automatic = symbicort 160 Take 2 puffs first thing in am and then another 2 puffs about 12 hours later.   Work on inhaler technique:    Plan B = Backup Only use your albuterol as a rescue medication   09/13/2018  f/u ov/Luis Townsend re: GOLD III on symbicort 160 2bid  Chief Complaint  Patient presents with  . Follow-up    Pt states his breathing is unchanged since last OV. Pt states he is using his rescue inhaler at least once daily. Pt denies cough, CP/tightness, f/c/s.    Dyspnea:  MMRC3= can't walk a slow pace on a flat grade s sob but does fine slow and flat would like to walk 2 blocks s topping  Cough: none   SABA use: p ex never rechallenges 02: none    No obvious day to day or daytime variability or assoc excess/ purulent sputum or mucus plugs or hemoptysis or cp or chest tightness, subjective wheeze or overt sinus or hb symptoms.   Sleeping flat/  1 pillow  without nocturnal  or early am exacerbation  of respiratory  c/o's or need for noct saba. Also denies any obvious fluctuation of symptoms with weather or environmental changes or other aggravating or alleviating factors except as outlined above   No unusual exposure hx or h/o childhood pna/ asthma or knowledge of premature birth.  Current Allergies, Complete Past Medical History, Past Surgical History, Family History, and Social History were reviewed in [employer] record.  ROS  The following are not active complaints unless bolded Hoarseness, sore throat, dysphagia, dental problems, itching, sneezing,  nasal congestion or discharge of excess mucus or purulent secretions, ear ache,   fever, chills, sweats, unintended wt loss or wt gain, classically pleuritic or exertional cp,  orthopnea pnd or arm/hand swelling  or leg swelling, presyncope, palpitations, abdominal pain, anorexia, nausea, vomiting, diarrhea  or change in bowel habits or change in bladder habits, change in stools or change in urine, dysuria, hematuria,  rash, arthralgias, visual complaints, headache, numbness, weakness or ataxia or problems with walking  or coordination,  change in mood or  memory.        Current Meds  Medication Sig  . albuterol (VENTOLIN HFA) 108 (90 Base) MCG/ACT inhaler INHALE 2 PUFFS BY MOUTH EVERY 4 HOURS AS NEEDED  . aspirin EC 81 MG tablet Take 81 mg by mouth daily.  Marland Kitchen  budesonide-formoterol (SYMBICORT) 160-4.5 MCG/ACT inhaler Inhale 2 puffs into the lungs 2 (two) times daily.  . carvedilol (COREG) 6.25 MG tablet TAKE 1 TABLET BY MOUTH TWICE A DAY WITH A MEAL  . ezetimibe (ZETIA) 10 MG tablet Take 1 tablet (10 mg total) by mouth daily.  . furosemide (LASIX) 20 MG tablet Take 1 tablet (20 mg total) by mouth daily.  . irbesartan (AVAPRO) 75 MG tablet Take 1 tablet (75 mg total) daily by mouth. (Patient taking differently: Take 75 mg by mouth daily. Pt states he is taking 1/2 tab)  . rosuvastatin (CRESTOR) 40 MG tablet TAKE 1 TABLET BY MOUTH EVERY DAY  . tadalafil (CIALIS) 5 MG tablet Take 1 tablet (5 mg total) by mouth daily as needed for erectile dysfunction.               Objective:   Physical Exam    amb wm nad   09/13/2018          145  10/02/2017       142 07/02/2017       146  05/26/2017       147   07/11/2016        142   05/30/16 143 lb (64.9 kg)  04/29/16 147 lb (66.7 kg)  04/15/16 148 lb (67.1 kg)     Vital signs reviewed - Note on arrival 02 sats  95% on RA       HEENT: nl dentition / oropharynx. Nl external ear canals without cough reflex -  Mild bilateral non-specific turbinate edema     NECK :  without JVD/Nodes/TM/ nl carotid upstrokes bilaterally   LUNGS: no acc muscle use,  Mild barrel  contour chest wall with bilateral  Distant bs s audible wheeze and  without cough on insp or exp maneuver and mild  Hyperresonant  to  percussion bilaterally     CV:  RRR  no s3 or murmur or increase in P2, and no edema   ABD:  soft and nontender with pos end  insp Hoover's  in the supine position. No bruits or organomegaly appreciated, bowel sounds nl  MS:   Nl gait/  ext warm without deformities,  calf tenderness, cyanosis or clubbing No obvious joint restrictions   SKIN: warm and dry without lesions    NEURO:  alert, approp, nl sensorium with  no motor or cerebellar deficits apparent.                  Assessment:

## 2018-09-13 NOTE — Telephone Encounter (Signed)
Attempted to call pt's wife Horris Latino but unable to reach. Left message for Horris Latino to return call.

## 2018-09-13 NOTE — Telephone Encounter (Signed)
Patient is returning phone call.  Patient phone number is [phone].

## 2018-09-13 NOTE — Patient Instructions (Addendum)
Trilegy one click each first thing x 2 good long drags to get all the medication   Only use your albuterol as a rescue medication to be used if you can't catch your breath by resting or doing a relaxed purse lip breathing pattern.  - The less you use it, the better it will work when you need it. - Ok to use up to 2 puffs  every 4 hours if you must but call for immediate appointment if use goes up over your usual need - Don't leave home without it !!  (think of it like the spare tire for your car)    If you are satisfied with your treatment plan,  let your doctor know and he/she can either refill your medications or you can return here when your prescription runs out.     If in any way you are not 100% satisfied,  please tell us.  If 100% better, tell your friends!  Pulmonary follow up is as needed

## 2018-09-13 NOTE — Telephone Encounter (Signed)
Dr. Melvyn Novas, your note is not finished from today and the wife has a question about which inhaler pt should be using. Is he to stay on symbicort or Trelegy. Please advise.

## 2018-09-14 NOTE — Telephone Encounter (Signed)
Called and clarified for patient instructions on medications and if the trelegy after sample isn't work to call our office back otherwise keep taking it.   Patient's wife verbalized and read back all information provided. Nothing further needed

## 2018-09-22 DIAGNOSIS — H348121 Central retinal vein occlusion, left eye, with retinal neovascularization: Secondary | ICD-10-CM | POA: Diagnosis not present

## 2018-09-22 DIAGNOSIS — H35042 Retinal micro-aneurysms, unspecified, left eye: Secondary | ICD-10-CM | POA: Diagnosis not present

## 2018-09-22 DIAGNOSIS — H353123 Nonexudative age-related macular degeneration, left eye, advanced atrophic without subfoveal involvement: Secondary | ICD-10-CM | POA: Diagnosis not present

## 2018-09-22 DIAGNOSIS — H34812 Central retinal vein occlusion, left eye, with macular edema: Secondary | ICD-10-CM | POA: Diagnosis not present

## 2018-09-22 DIAGNOSIS — H2512 Age-related nuclear cataract, left eye: Secondary | ICD-10-CM | POA: Diagnosis not present

## 2018-10-04 ENCOUNTER — Other Ambulatory Visit: Payer: Self-pay | Admitting: Pulmonary Disease

## 2018-10-12 ENCOUNTER — Ambulatory Visit (INDEPENDENT_AMBULATORY_CARE_PROVIDER_SITE_OTHER): Payer: Medicare Other | Admitting: *Deleted

## 2018-10-12 DIAGNOSIS — I5022 Chronic systolic (congestive) heart failure: Secondary | ICD-10-CM

## 2018-10-12 DIAGNOSIS — I428 Other cardiomyopathies: Secondary | ICD-10-CM

## 2018-10-12 LAB — CUP PACEART REMOTE DEVICE CHECK
Battery Remaining Longevity: 9 mo
Battery Voltage: 2.81 V
Brady Statistic AP VP Percent: 87.69 %
Brady Statistic AP VS Percent: 0.18 %
Brady Statistic AS VP Percent: 11.46 %
Brady Statistic AS VS Percent: 0.67 %
Brady Statistic RA Percent Paced: 87.91 %
Brady Statistic RV Percent Paced: 99.14 %
Date Time Interrogation Session: 20200901044200
Implantable Lead Implant Date: 20131002
Implantable Lead Implant Date: 20131002
Implantable Lead Implant Date: 20171019
Implantable Lead Location: 753858
Implantable Lead Location: 753859
Implantable Lead Location: 753860
Implantable Lead Model: 4298
Implantable Lead Model: 5076
Implantable Lead Model: 5076
Implantable Pulse Generator Implant Date: 20171019
Lead Channel Impedance Value: 342 Ohm
Lead Channel Impedance Value: 361 Ohm
Lead Channel Impedance Value: 361 Ohm
Lead Channel Impedance Value: 399 Ohm
Lead Channel Impedance Value: 437 Ohm
Lead Channel Impedance Value: 456 Ohm
Lead Channel Impedance Value: 456 Ohm
Lead Channel Impedance Value: 513 Ohm
Lead Channel Impedance Value: 589 Ohm
Lead Channel Impedance Value: 684 Ohm
Lead Channel Impedance Value: 703 Ohm
Lead Channel Impedance Value: 836 Ohm
Lead Channel Impedance Value: 912 Ohm
Lead Channel Impedance Value: 931 Ohm
Lead Channel Pacing Threshold Amplitude: 0.875 V
Lead Channel Pacing Threshold Amplitude: 2.5 V
Lead Channel Pacing Threshold Pulse Width: 0.4 ms
Lead Channel Pacing Threshold Pulse Width: 1 ms
Lead Channel Sensing Intrinsic Amplitude: 2.375 mV
Lead Channel Sensing Intrinsic Amplitude: 2.375 mV
Lead Channel Setting Pacing Amplitude: 2 V
Lead Channel Setting Pacing Amplitude: 2.5 V
Lead Channel Setting Pacing Amplitude: 3.5 V
Lead Channel Setting Pacing Pulse Width: 0.4 ms
Lead Channel Setting Pacing Pulse Width: 1 ms
Lead Channel Setting Sensing Sensitivity: 4 mV

## 2018-10-19 ENCOUNTER — Telehealth: Payer: Self-pay | Admitting: *Deleted

## 2018-10-19 MED ORDER — CARVEDILOL 12.5 MG PO TABS: 12.5000 mg | ORAL_TABLET | Freq: Two times a day (BID) | ORAL | 1 refills | Status: AC

## 2018-10-19 NOTE — Telephone Encounter (Signed)
Notes recorded by Stanton Kidney, RN on 10/19/2018 at 1:10 PM EDT  Informed patient of results and verbal understanding expressed.  Pt agreeable to plan. Rx sent to pharmacy. ------   Notes recorded by Constance Haw, MD on 10/13/2018 at 8:36 AM EDT  Abnormal device interrogation reviewed. Lead parameters and battery status stable. 8 months left on battery. Also NSVT. Increase coreg to 12.5 mg BID.

## 2018-10-20 DIAGNOSIS — Z23 Encounter for immunization: Secondary | ICD-10-CM | POA: Diagnosis not present

## 2018-10-27 DIAGNOSIS — H43822 Vitreomacular adhesion, left eye: Secondary | ICD-10-CM | POA: Diagnosis not present

## 2018-10-27 DIAGNOSIS — H35032 Hypertensive retinopathy, left eye: Secondary | ICD-10-CM | POA: Diagnosis not present

## 2018-10-27 DIAGNOSIS — H35352 Cystoid macular degeneration, left eye: Secondary | ICD-10-CM | POA: Diagnosis not present

## 2018-10-27 DIAGNOSIS — H34812 Central retinal vein occlusion, left eye, with macular edema: Secondary | ICD-10-CM | POA: Diagnosis not present

## 2018-10-27 NOTE — Progress Notes (Signed)
Remote pacemaker transmission.   

## 2018-11-24 ENCOUNTER — Encounter: Payer: Medicare Other | Admitting: Adult Health

## 2018-12-04 ENCOUNTER — Other Ambulatory Visit: Payer: Self-pay | Admitting: Cardiovascular Disease

## 2018-12-05 ENCOUNTER — Other Ambulatory Visit: Payer: Self-pay | Admitting: Cardiovascular Disease

## 2018-12-08 DIAGNOSIS — H35352 Cystoid macular degeneration, left eye: Secondary | ICD-10-CM | POA: Diagnosis not present

## 2018-12-08 DIAGNOSIS — H353123 Nonexudative age-related macular degeneration, left eye, advanced atrophic without subfoveal involvement: Secondary | ICD-10-CM | POA: Diagnosis not present

## 2018-12-08 DIAGNOSIS — H2512 Age-related nuclear cataract, left eye: Secondary | ICD-10-CM | POA: Diagnosis not present

## 2018-12-08 DIAGNOSIS — H34812 Central retinal vein occlusion, left eye, with macular edema: Secondary | ICD-10-CM | POA: Diagnosis not present

## 2018-12-18 ENCOUNTER — Other Ambulatory Visit: Payer: Self-pay | Admitting: Internal Medicine

## 2019-01-05 ENCOUNTER — Emergency Department (HOSPITAL_COMMUNITY): Payer: Medicare Other

## 2019-01-05 ENCOUNTER — Emergency Department (HOSPITAL_COMMUNITY)
Admission: EM | Admit: 2019-01-05 | Discharge: 2019-01-05 | Disposition: A | Discharge status: Home / Self Care | Payer: Medicare Other | Attending: Emergency Medicine | Admitting: Emergency Medicine

## 2019-01-05 ENCOUNTER — Encounter (HOSPITAL_COMMUNITY): Payer: Self-pay | Admitting: *Deleted

## 2019-01-05 ENCOUNTER — Other Ambulatory Visit: Payer: Self-pay

## 2019-01-05 DIAGNOSIS — J449 Chronic obstructive pulmonary disease, unspecified: Secondary | ICD-10-CM | POA: Insufficient documentation

## 2019-01-05 DIAGNOSIS — Z79899 Other long term (current) drug therapy: Secondary | ICD-10-CM | POA: Diagnosis not present

## 2019-01-05 DIAGNOSIS — J90 Pleural effusion, not elsewhere classified: Secondary | ICD-10-CM | POA: Diagnosis not present

## 2019-01-05 DIAGNOSIS — Z7982 Long term (current) use of aspirin: Secondary | ICD-10-CM | POA: Diagnosis not present

## 2019-01-05 DIAGNOSIS — R911 Solitary pulmonary nodule: Secondary | ICD-10-CM | POA: Insufficient documentation

## 2019-01-05 DIAGNOSIS — J181 Lobar pneumonia, unspecified organism: Secondary | ICD-10-CM | POA: Diagnosis not present

## 2019-01-05 DIAGNOSIS — Z87891 Personal history of nicotine dependence: Secondary | ICD-10-CM | POA: Insufficient documentation

## 2019-01-05 DIAGNOSIS — Z951 Presence of aortocoronary bypass graft: Secondary | ICD-10-CM | POA: Diagnosis not present

## 2019-01-05 DIAGNOSIS — R918 Other nonspecific abnormal finding of lung field: Secondary | ICD-10-CM | POA: Diagnosis not present

## 2019-01-05 DIAGNOSIS — R0602 Shortness of breath: Secondary | ICD-10-CM | POA: Diagnosis not present

## 2019-01-05 DIAGNOSIS — Z950 Presence of cardiac pacemaker: Secondary | ICD-10-CM | POA: Insufficient documentation

## 2019-01-05 DIAGNOSIS — I10 Essential (primary) hypertension: Secondary | ICD-10-CM | POA: Insufficient documentation

## 2019-01-05 DIAGNOSIS — I2510 Atherosclerotic heart disease of native coronary artery without angina pectoris: Secondary | ICD-10-CM | POA: Insufficient documentation

## 2019-01-05 LAB — BASIC METABOLIC PANEL
Anion gap: 9 (ref 5–15)
BUN: 23 mg/dL (ref 8–23)
CO2: 27 mmol/L (ref 22–32)
Calcium: 8.8 mg/dL — ABNORMAL LOW (ref 8.9–10.3)
Chloride: 99 mmol/L (ref 98–111)
Creatinine, Ser: 1.29 mg/dL — ABNORMAL HIGH (ref 0.61–1.24)
GFR calc Af Amer: 57 mL/min — ABNORMAL LOW (ref >60)
GFR calc non Af Amer: 49 mL/min — ABNORMAL LOW (ref >60)
Glucose, Bld: 131 mg/dL — ABNORMAL HIGH (ref 70–99)
Potassium: 4.6 mmol/L (ref 3.5–5.1)
Sodium: 135 mmol/L (ref 135–145)

## 2019-01-05 LAB — CBC WITH DIFFERENTIAL/PLATELET
Abs Immature Granulocytes: 0.03 10*3/uL (ref 0.00–0.07)
Basophils Absolute: 0.1 10*3/uL (ref 0.0–0.1)
Basophils Relative: 1 %
Eosinophils Absolute: 0.2 10*3/uL (ref 0.0–0.5)
Eosinophils Relative: 2 %
HCT: 38 % — ABNORMAL LOW (ref 39.0–52.0)
Hemoglobin: 12.7 g/dL — ABNORMAL LOW (ref 13.0–17.0)
Immature Granulocytes: 0 %
Lymphocytes Relative: 11 %
Lymphs Abs: 1 10*3/uL (ref 0.7–4.0)
MCH: 31.4 pg (ref 26.0–34.0)
MCHC: 33.4 g/dL (ref 30.0–36.0)
MCV: 93.8 fL (ref 80.0–100.0)
Monocytes Absolute: 0.9 10*3/uL (ref 0.1–1.0)
Monocytes Relative: 10 %
Neutro Abs: 6.8 10*3/uL (ref 1.7–7.7)
Neutrophils Relative %: 76 %
Platelets: 263 10*3/uL (ref 150–400)
RBC: 4.05 MIL/uL — ABNORMAL LOW (ref 4.22–5.81)
RDW: 13.3 % (ref 11.5–15.5)
WBC: 8.9 10*3/uL (ref 4.0–10.5)
nRBC: 0 % (ref 0.0–0.2)

## 2019-01-05 LAB — POCT I-STAT 7, (LYTES, BLD GAS, ICA,H+H)
Acid-Base Excess: 2 mmol/L (ref 0.0–2.0)
Bicarbonate: 23.8 mmol/L (ref 20.0–28.0)
Calcium, Ion: 1.15 mmol/L (ref 1.15–1.40)
HCT: 32 % — ABNORMAL LOW (ref 39.0–52.0)
Hemoglobin: 10.9 g/dL — ABNORMAL LOW (ref 13.0–17.0)
O2 Saturation: 96 %
Patient temperature: 98.6
Potassium: 4.1 mmol/L (ref 3.5–5.1)
Sodium: 133 mmol/L — ABNORMAL LOW (ref 135–145)
TCO2: 25 mmol/L (ref 22–32)
pCO2 arterial: 28.5 mmHg — ABNORMAL LOW (ref 32.0–48.0)
pH, Arterial: 7.529 — ABNORMAL HIGH (ref 7.350–7.450)
pO2, Arterial: 71 mmHg — ABNORMAL LOW (ref 83.0–108.0)

## 2019-01-05 LAB — BRAIN NATRIURETIC PEPTIDE: B Natriuretic Peptide: 103 pg/mL — ABNORMAL HIGH (ref 0.0–100.0)

## 2019-01-05 MED ORDER — IOHEXOL 300 MG/ML  SOLN
75.0000 mL | Freq: Once | INTRAMUSCULAR | Status: AC | PRN
  Administered 2019-01-05: 75 mL via INTRAVENOUS

## 2019-01-05 MED ORDER — AMOXICILLIN-POT CLAVULANATE 875-125 MG PO TABS: 1.0000 | ORAL_TABLET | Freq: Two times a day (BID) | ORAL | 0 refills | Status: AC

## 2019-01-05 MED ORDER — IPRATROPIUM BROMIDE HFA 17 MCG/ACT IN AERS
2.0000 | INHALATION_SPRAY | Freq: Once | RESPIRATORY_TRACT | Status: AC
  Administered 2019-01-05: 2 via RESPIRATORY_TRACT
  Filled 2019-01-05: qty 12.9

## 2019-01-05 MED ORDER — ALBUTEROL SULFATE HFA 108 (90 BASE) MCG/ACT IN AERS
6.0000 | INHALATION_SPRAY | Freq: Once | RESPIRATORY_TRACT | Status: AC
  Administered 2019-01-05: 6 via RESPIRATORY_TRACT
  Filled 2019-01-05: qty 6.7

## 2019-01-05 MED ORDER — AMOXICILLIN-POT CLAVULANATE 875-125 MG PO TABS
1.0000 | ORAL_TABLET | Freq: Once | ORAL | Status: AC
  Administered 2019-01-05: 1 via ORAL
  Filled 2019-01-05: qty 1

## 2019-01-05 MED ORDER — METHYLPREDNISOLONE SODIUM SUCC 125 MG IJ SOLR
125.0000 mg | Freq: Once | INTRAMUSCULAR | Status: AC
  Administered 2019-01-05: 125 mg via INTRAVENOUS
  Filled 2019-01-05: qty 2

## 2019-01-05 NOTE — ED Notes (Signed)
Patient transported to CT 

## 2019-01-05 NOTE — ED Notes (Signed)
medtronic interrogation--No arrhthymias or high rates;appears to be working appropriately. Per tech, battery life approaching recommended time of replacement. Currently operating at 2.66 V, reports battery change at 2.6V.

## 2019-01-05 NOTE — ED Triage Notes (Signed)
Pt from home c/o shortness of breath while lying in bed. Pt used rescue inhaler pta with some improvement. Wife reported pt has been generally weak for the past 2 weeks. Denies any chest pain, pacemaker noted to L chest

## 2019-01-05 NOTE — ED Provider Notes (Signed)
[facility] Provider Note  CSN: [id number] Arrival date & time: 01/05/19 0230  Chief Complaint(s) Shortness of Breath and Weakness  HPI [facility] is a 85 y.o. male with a past medical history listed below including COPD not on home oxygen, chronic CHF, who presents to the emergency department with shortness of breath that began 2 hours prior to arrival.  Patient reports that he awoke this evening and felt like he could not catch his breath.  Reports that he took his rescue inhaler which did provide some relief.  He denied any associated chest pain.  No recent fevers or infections.  Endorsed chronic cough without acute change.  Denied any known lower extremity edema.  Reports that he felt fatigued yesterday.  Denies any strenuous activity.  Currently feels much improved, and denies feeling short of breath.  HPI  Past Medical History Past Medical History:  Diagnosis Date   Amaurosis fugax of left eye 10/11/1996   Aortic stenosis 06/30/2011   severe   Aortoiliac occlusive disease ([place]) 06/30/2011   Cardiac resynchronization therapy pacemaker (CRT-P) in place    a. 11/29/15:  Medtronic model W0JW11 Percepta Quad CRT-P MRI SureScan (serial Number [id number] )   Cerebrovascular disease 06/30/2011   CHB (complete heart block) ([place]) 02/03/2013   COPD (chronic obstructive pulmonary disease), moderat to severe 07/29/2011   Coronary artery disease    a. 10/29/15 Cath for decreased EF with patent 2/3 grafts, no change    Hyperlipidemia    Hypertension    Myocardial infarction ([place]) 03/22/1994   acute LAD occlusion   PAD (peripheral artery disease) (HCC)    RBBB (right bundle branch block)    S/P CABG x 5 03/28/1994   LIMA to LAD, sequential SVG to OM1-OM2-OM4, SVG to RCA, open vein harvest right leg - Dr Redmond Pulling   S/P carotid endarterectomy 10/28/1996   Dr Early   Subclavian artery stenosis, left ([place]) 06/30/2011   Tobacco abuse     Patient Active Problem List   Diagnosis Date Noted   Dyspnea on exertion 05/30/2016   Status post biventricular pacemaker 12/05/2015   Cardiac resynchronization therapy pacemaker (CRT-P) in place    Nonischemic cardiomyopathy ([place])    Bleeding at insertion site 10/29/2015   Left ventricular systolic dysfunction    PAD (peripheral artery disease) (HCC)    Chronic systolic CHF (congestive heart failure) ([place]) 10/11/2015   Pre-op testing 10/11/2015   Clotting disorder ([facility]) 10/11/2015   SSS (sick sinus syndrome) ([place]) 08/30/2015   Carotid stenosis 08/30/2015   CHB (complete heart block) ([place]) 02/03/2013   Pacemaker - Medtronic CRT-P upgrade 2017 02/03/2013   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   Aortic stenosis, severe 09/29/2011   Hyperlipidemia 08/27/2011   Aortic valve stenosis, severe 08/27/2011   CAD (coronary artery disease) 08/27/2011   HTN (hypertension) 08/27/2011   PVD (peripheral vascular disease) ([place]) 08/27/2011   COPD GOLD II vs III 07/29/2011   Bradycardia 07/28/2011   S/P aortic valve replacement 07/21/2011   Aortic stenosis, critical AS with TAVR 11/2011 06/30/2011   Cerebrovascular disease 06/30/2011   Subclavian artery stenosis, left, pta and stent 07/29/11 06/30/2011   Aortoiliac occlusive disease ([place]) 06/30/2011   Essential hypertension    S/P carotid endarterectomy 10/28/1996   S/P CABG x 5, stenosis to [place] 03/28/1994   Home Medication(s) Prior to Admission medications   Medication Sig Start Date End Date Taking? Authorizing Provider  albuterol (VENTOLIN HFA) 108 (90 Base)  MCG/ACT inhaler INHALE 2 PUFFS BY MOUTH EVERY 4 HOURS AS NEEDED Patient taking differently: Inhale 2 puffs into the lungs every 4 (four) hours as needed for wheezing or shortness of breath.  12/20/18  Yes Tanda Rockers, MD  aspirin EC 81 MG tablet Take 81 mg by mouth daily.   Yes [provider]  carvedilol  (COREG) 12.5 MG tablet Take 1 tablet (12.5 mg total) by mouth 2 (two) times daily. 10/19/18 01/17/19 Yes Camnitz, Will Hassell Done, MD  ezetimibe (ZETIA) 10 MG tablet TAKE 1 TABLET BY MOUTH EVERY DAY Patient taking differently: Take 10 mg by mouth daily.  12/07/18  Yes Croitoru, Mihai, MD  Fluticasone-Umeclidin-Vilant (TRELEGY ELLIPTA) 100-62.5-25 MCG/INH AEPB Inhale 1 puff into the lungs daily. 09/13/18  Yes Tanda Rockers, MD  furosemide (LASIX) 20 MG tablet TAKE 1 TABLET BY MOUTH EVERY DAY Patient taking differently: Take 20 mg by mouth daily.  12/07/18  Yes Croitoru, Mihai, MD  irbesartan (AVAPRO) 75 MG tablet Take 1 tablet (75 mg total) daily by mouth. Patient taking differently: Take 37.5 mg by mouth daily.  12/26/16  Yes Nafziger, [name], NP  rosuvastatin (CRESTOR) 40 MG tablet TAKE 1 TABLET BY MOUTH EVERY DAY Patient taking differently: Take 40 mg by mouth at bedtime.  09/01/18  Yes Camnitz, Will Hassell Done, MD  amoxicillin-clavulanate (AUGMENTIN) 875-125 MG tablet Take 1 tablet by mouth every 12 (twelve) hours for 10 days. 01/05/19 01/15/19  Fatima Blank, MD  tadalafil (CIALIS) 5 MG tablet Take 1 tablet (5 mg total) by mouth daily as needed for erectile dysfunction. Patient not taking: Reported on 01/05/2019 10/08/17   Dorothyann Peng, NP                                                                                                                                    Past Surgical History Past Surgical History:  Procedure Laterality Date   BILATERAL UPPER EXTREMITY ANGIOGRAM N/A 07/29/2011   Procedure: BILATERAL UPPER EXTREMITY ANGIOGRAM;  Surgeon: Lorretta Harp, MD;  Location[facility] CATH LAB;  Service: Cardiovascular;  Laterality: N/A;   CARDIAC CATHETERIZATION  06/24/2011   No intervention - Recommendation-angioplasty/stenting of Lft subclavian artery, high risk replacement of aortic valve w/ biological prosthesis and redo CABG w/ preservation of LIMA bypass and new SVG to PDA and OM,  reevaluate carotid arteries and pulmonary function prior to surgery.   CARDIAC CATHETERIZATION N/A 10/29/2015   Procedure: Coronary/Graft Angiography;  Surgeon: Lorretta Harp, MD;  Location: [facility] CV LAB;  Service: Cardiovascular;  Laterality: N/A;   CARDIAC VALVE REPLACEMENT     CARDIOVASCULAR STRESS TEST  07/10/2009   Evidence of mild ischemia in Basal Anteriolateral and Mid Anterolateral regions-additional diaphragmatic attenuation. No ECG changes. Low risk.\   CAROTID DOPPLER  10/18/2012   Rt bulb/prox ICA demonstrated 50-69% diameter reduction, Lft mid ICA demonstrated 50-69% diameter reduction, Lft subclavian- = to or <50% sig diameter reduction,  Lft vertebral-appeared occluded   CAROTID ENDARTERECTOMY  10/28/96  DR. TODD EARLY   LEFT AND DACRON PATCH ANGIOPLASTY   CATARACT EXTRACTION Right 2017   CORONARY ARTERY BYPASS GRAFT  03/28/94 DR. CHARLES WILSON   x5(LIMA to LAD,SVG to OM1-OM2-OM4, SVG to RCA   EP IMPLANTABLE DEVICE N/A 11/29/2015   Procedure: BiV Upgrade;  Surgeon: Will Meredith Leeds, MD;  Location: [facility] CV LAB;  Service: Cardiovascular;  Laterality: N/A;   EYE SURGERY     LEFT HEART CATHETERIZATION WITH CORONARY/GRAFT ANGIOGRAM N/A 06/24/2011   Procedure: LEFT HEART CATHETERIZATION WITH Beatrix Fetters;  Surgeon: Sanda Klein, MD;  Location: [facility] CATH LAB;  Service: Cardiovascular;  Laterality: N/A;   PACEMAKER INSERTION  11/12/2011   Medtronic Adapta; model# ADDR01, serial# [id number]   PERIPHERAL VASCULAR ANGIOPLASTY  07/29/2011   Lft Subclavian 90% stenosis, stented w/ a 7x75mm long Cordis Genesis resulting in reduction of 90% stenosos to 0% resdiual.   TRANSTHORACIC ECHOCARDIOGRAM  08/05/2012   EF 50-55%, septial motion showed abnormal function and dyssynergy, LA mild-moderately dilated   Family History Family History  Problem Relation Age of Onset   Stroke Mother 68       died of stroke   Heart attack Father 40       did not drink    Heart disease Father    Other Father        polio   Pneumonia Maternal Grandmother    Diabetes Maternal Grandfather    Heart attack Paternal Grandmother     Social History Social History   Tobacco Use   Smoking status: Former Smoker    Packs/day: 1.00    Years: 60.00    Pack years: 60.00    Types: Cigarettes    Quit date: 03/02/2011    Years since quitting: 7.8   Smokeless tobacco: Never Used  Substance Use Topics   Alcohol use: No    Alcohol/week: 0.0 standard drinks   Drug use: No   Allergies Patient has no known allergies.  Review of Systems Review of Systems All other systems are reviewed and are negative for acute change except as noted in the HPI  Physical Exam Vital Signs  I have reviewed the triage vital signs BP (!) 107/58    Pulse 74    Temp 98.2 F (36.8 C) (Oral)    Resp (!) 26    SpO2 100%   Physical Exam Vitals signs reviewed.  Constitutional:      General: He is not in acute distress.    Appearance: He is well-developed. He is not diaphoretic.  HENT:     Head: Normocephalic and atraumatic.     Nose: Nose normal.  Eyes:     General: No scleral icterus.       Right eye: No discharge.        Left eye: No discharge.     Conjunctiva/sclera: Conjunctivae normal.     Pupils: Pupils are equal, round, and reactive to light.  Neck:     Musculoskeletal: Normal range of motion and neck supple.  Cardiovascular:     Rate and Rhythm: Normal rate and regular rhythm.     Heart sounds: No murmur. No friction rub. No gallop.   Pulmonary:     Effort: Pulmonary effort is normal. No respiratory distress.     Breath sounds: No stridor. Examination of the left-middle field reveals wheezing. Examination of the left-lower field reveals wheezing. Wheezing present. No rales.     Comments: +egophony to  RML Chest:    Abdominal:     General: There is no distension.     Palpations: Abdomen is soft.     Tenderness: There is no abdominal tenderness.   Musculoskeletal:        General: No tenderness.     Right lower leg: 1+ Pitting Edema present.     Left lower leg: 1+ Pitting Edema present.  Skin:    General: Skin is warm and dry.     Findings: No erythema or rash.  Neurological:     Mental Status: He is alert and oriented to person, place, and time.     ED Results and Treatments Labs (all labs ordered are listed, but only abnormal results are displayed) Labs Reviewed  CBC WITH DIFFERENTIAL/PLATELET - Abnormal; Notable for the following components:      Result Value   RBC 4.05 (*)    Hemoglobin 12.7 (*)    HCT 38.0 (*)    All other components within normal limits  BASIC METABOLIC PANEL - Abnormal; Notable for the following components:   Glucose, Bld 131 (*)    Creatinine, Ser 1.29 (*)    Calcium 8.8 (*)    GFR calc non Af Amer 49 (*)    GFR calc Af Amer 57 (*)    All other components within normal limits  BRAIN NATRIURETIC PEPTIDE - Abnormal; Notable for the following components:   B Natriuretic Peptide 103.0 (*)    All other components within normal limits  POCT I-STAT 7, (LYTES, BLD GAS, ICA,H+H) - Abnormal; Notable for the following components:   pH, Arterial 7.529 (*)    pCO2 arterial 28.5 (*)    pO2, Arterial 71.0 (*)    Sodium 133 (*)    HCT 32.0 (*)    Hemoglobin 10.9 (*)    All other components within normal limits  BLOOD GAS, ARTERIAL                                                                                                                         EKG  EKG Interpretation  Date/Time:  Wednesday January 05 2019 02:38:17 EST Ventricular Rate:  72 PR Interval:    QRS Duration: 150 QT Interval:  439 QTC Calculation: 481 R Axis:   -72 Text Interpretation: Sinus rhythm Right bundle branch block Consider left ventricular hypertrophy Inferior infarct, old Baseline wander in lead(s) V4 V5 No significant change since last tracing Confirmed by Addison Lank [phone]) on 01/05/2019 2:42:02 AM       Radiology Ct Chest W Contrast  Result Date: 01/05/2019 CLINICAL DATA:  Shortness of breath EXAM: CT CHEST WITH CONTRAST TECHNIQUE: Multidetector CT imaging of the chest was performed during intravenous contrast administration. CONTRAST:  55mL OMNIPAQUE IOHEXOL 300 MG/ML  SOLN COMPARISON:  Radiographs 01/05/2019, 04/30/2017, CTA chest, abdomen and pelvis 07/09/2011 FINDINGS: Cardiovascular: Cardiac pacer pack is within the chest wall soft tissues. Leads are seen in the right atrium, cardiac apex and coronary sinus. Cardiac  size at the upper limits of normal. There is prior aortic valve replacement. Extensive coronary artery calcifications are noted. Post CABG changes are present as well with sternotomy sutures in place. Central pulmonary arteries are normal caliber. No large central filling defects on this non tailored examination. The thoracic aorta is heavily calcified but normal caliber. Normal 3 vessel branching of the arch. There is vascular stenting of the left subclavian artery proximal to the vertebral artery origin. Mediastinum/Nodes: There are several enlarged lymph nodes in the mediastinum and hila, largest is a 16 mm subcarinal lymph node (3/74), a paratracheal lymph node measures 11 mm (3/54). Left hilar node measures 12 mm in size (3/74), additional left hilar lymph node measures 12 mm in size (3/66). No contralateral right hilar adenopathy. No acute abnormality of the trachea or esophagus. Coronal narrowing of the trachea typically pathognomonic for COPD. Lungs/Pleura: Centrilobular and paraseptal emphysema. Large masslike consolidation in the left lower lobe measuring up to 6.5 x 3.8 cm in maximum transaxial dimension. There is internal heterogeneity in hypoattenuation. A separate masslike consolidation is noted more inferiorly and medially in the left lung base measuring 4.9 x 3.1 cm. Additional interspersed areas of airspace disease are present. Associated airways thickening is noted. Small  left effusion is seen. Question some mild pleural thickening. Fluid is seen tracking along the left fissure. Upper Abdomen: No acute abnormalities present in the visualized portions of the upper abdomen. Scattered colonic diverticula without focal pericolonic inflammation to suggest diverticulitis. Nodular thickening of the adrenal glands. Fluid attenuation cysts in the right and left kidney, incompletely visualized on this exam. Musculoskeletal: Multilevel degenerative changes are present in the imaged portions of the spine. Additional degenerative changes in the shoulders. Sclerotic focus in the left humeral head neck junction is similar to comparison radiographs. The osseous structures appear diffusely demineralized which may limit detection of small or nondisplaced fractures. Remote appearing compression deformities T12 and T7 present on comparison radiography from 04/30/2017. no acute fracture. Prior sternotomy changes. IMPRESSION: 1. Large masslike consolidation in the left lower lobe measuring up to 6.5 x 3.8 cm, with additional consolidation more inferiorly and medially in the left lung base measuring 4.9 x 3.1 cm, findings could reflect either a necrotic pneumonia or underlying malignancy. Airways thickening and a left pleural effusion with some pleural thickening which may reflect an empyema. 2. Multiple enlarged mediastinal and hilar lymph nodes, possibly reactive or metastatic. 3. Extensive coronary artery atherosclerosis and postsurgical changes from CABG. 4. Extensive atherosclerotic calcification of the aorta and branch vessels with stenting of the proximal left subclavian artery. 5. Remote appearing compression deformities T12 and T7 present on comparison radiography from 04/30/2017. 6. Aortic Atherosclerosis (ICD10-I70.0) 7. Emphysema (ICD10-J43.9). Electronically Signed   By: Lovena Le M.D.   On: 01/05/2019 04:58   Dg Chest Port 1 View  Result Date: 01/05/2019 CLINICAL DATA:  Shortness of  breath EXAM: PORTABLE CHEST 1 VIEW COMPARISON:  04/30/2017 FINDINGS: Cardiac shadow is stable. Changes of prior TAVR are again seen. Pacing device is again noted and stable. Aortic calcifications are seen. Postsurgical changes are noted. The lungs are hyperinflated. Somewhat rounded density in the left mid lung is noted suspicious for underlying mass although acute infiltrate may present with similar fashion. This is new from the prior exam. Small left pleural effusion is noted. No acute bony abnormality is noted. IMPRESSION: Rounded density in the left mid lung suspicious for underlying mass although acute infiltrate consent in a similar fashion. Contrast enhanced CT may be  helpful for further evaluation. Electronically Signed   By: Inez Catalina M.D.   On: 01/05/2019 03:08    Pertinent labs & imaging results that were available during my care of the patient were reviewed by me and considered in my medical decision making (see chart for details).  Medications Ordered in ED Medications  amoxicillin-clavulanate (AUGMENTIN) 875-125 MG per tablet 1 tablet (has no administration in time range)  albuterol (VENTOLIN HFA) 108 (90 Base) MCG/ACT inhaler 6 puff (6 puffs Inhalation Given 01/05/19 0314)  ipratropium (ATROVENT HFA) inhaler 2 puff (2 puffs Inhalation Given 01/05/19 0310)  methylPREDNISolone sodium succinate (SOLU-MEDROL) 125 mg/2 mL injection 125 mg (125 mg Intravenous Given 01/05/19 0310)  iohexol (OMNIPAQUE) 300 MG/ML solution 75 mL (75 mLs Intravenous Contrast Given 01/05/19 0431)                                                                                                                                    Procedures Procedures  (including critical care time)  Medical Decision Making / ED Course I have reviewed the nursing notes for this encounter and the patient's prior records (if available in EHR or on provided paperwork).   MARQUEST GUNKEL was evaluated in Emergency Department on  01/05/2019 for the symptoms described in the history of present illness. He was evaluated in the context of the global COVID-19 pandemic, which necessitated consideration that the patient might be at risk for infection with the SARS-CoV-2 virus that causes COVID-19. Institutional protocols and algorithms that pertain to the evaluation of patients at risk for COVID-19 are in a state of rapid change based on information released by regulatory bodies including the CDC and federal and state organizations. These policies and algorithms were followed during the patient's care in the ED.  SOB improved with albuterol.  Known h/o CHF with mild peripheral edema. BNP reassuring.  CXR with RML round opacity. PNA vs Mass.  CT obtained unable to differentiate. But CBC w/o leukocytosis. Pt is not febrile. H/o long time smoker with COPD. Favor neoplasm. Patient and wife informed of findings. Will treat for PNA and have patient follow up in 2 weeks.   Rest of w/u reassuring. Doubt PE given improvement, lack of hypoxia or tachycardia.  First dose of Augmentin given here.  The patient appears reasonably screened and/or stabilized for discharge and I doubt any other medical condition or other [facility] requiring further screening, evaluation, or treatment in the ED at this time prior to discharge.  The patient is safe for discharge with strict return precautions.       Final Clinical Impression(s) / ED Diagnoses Final diagnoses:  SOB (shortness of breath)  Lung mass    The patient appears reasonably screened and/or stabilized for discharge and I doubt any other medical condition or other [facility] requiring further screening, evaluation, or treatment in the ED at this time prior to discharge.  Disposition: Discharge  Condition: Good  I have discussed the results, Dx and Tx plan with the patient who expressed understanding and agree(s) with the plan. Discharge instructions discussed at great length. The patient and  wife were given strict return precautions who verbalized understanding of the instructions. No further questions at time of discharge.    ED Discharge Orders         Ordered    amoxicillin-clavulanate (AUGMENTIN) 875-125 MG tablet  Every 12 hours     01/05/19 0549           Follow Up: Dorothyann Peng, NP [address] [place] [address] [phone]  Call  For close follow up to assess for lung mass vs pneumonia  Tanda Rockers, MD [address] [place] [address] [phone]  Call  For close follow up to assess for lung mass vs pneumonia      This chart was dictated using voice recognition software.  Despite best efforts to proofread,  errors can occur which can change the documentation meaning.   Fatima Blank, MD 01/05/19 [phone]

## 2019-01-10 ENCOUNTER — Telehealth: Payer: Self-pay | Admitting: *Deleted

## 2019-01-10 NOTE — Telephone Encounter (Signed)
Spoke with the pt's spouse and scheduled appt with MW for 01/19/19

## 2019-01-10 NOTE — Telephone Encounter (Signed)
-----   Message from Tanda Rockers, MD sent at 01/06/2019  5:59 AM EST ----- Needs ov with cxr 2 weeks to f/u ER eval

## 2019-01-11 ENCOUNTER — Ambulatory Visit (INDEPENDENT_AMBULATORY_CARE_PROVIDER_SITE_OTHER): Payer: Medicare Other | Admitting: *Deleted

## 2019-01-11 DIAGNOSIS — R001 Bradycardia, unspecified: Secondary | ICD-10-CM

## 2019-01-11 LAB — CUP PACEART REMOTE DEVICE CHECK
Battery Remaining Longevity: 2 mo
Battery Voltage: 2.64 V
Brady Statistic AP VP Percent: 94.76 %
Brady Statistic AP VS Percent: 0.05 %
Brady Statistic AS VP Percent: 4.99 %
Brady Statistic AS VS Percent: 0.2 %
Brady Statistic RA Percent Paced: 94.81 %
Brady Statistic RV Percent Paced: 99.74 %
Date Time Interrogation Session: 20201130234813
Implantable Lead Implant Date: 20131002
Implantable Lead Implant Date: 20131002
Implantable Lead Implant Date: 20171019
Implantable Lead Location: 753858
Implantable Lead Location: 753859
Implantable Lead Location: 753860
Implantable Lead Model: 4298
Implantable Lead Model: 5076
Implantable Lead Model: 5076
Implantable Pulse Generator Implant Date: 20171019
Lead Channel Impedance Value: 285 Ohm
Lead Channel Impedance Value: 323 Ohm
Lead Channel Impedance Value: 342 Ohm
Lead Channel Impedance Value: 380 Ohm
Lead Channel Impedance Value: 399 Ohm
Lead Channel Impedance Value: 399 Ohm
Lead Channel Impedance Value: 437 Ohm
Lead Channel Impedance Value: 494 Ohm
Lead Channel Impedance Value: 532 Ohm
Lead Channel Impedance Value: 570 Ohm
Lead Channel Impedance Value: 589 Ohm
Lead Channel Impedance Value: 760 Ohm
Lead Channel Impedance Value: 836 Ohm
Lead Channel Impedance Value: 874 Ohm
Lead Channel Pacing Threshold Amplitude: 1 V
Lead Channel Pacing Threshold Amplitude: 4.25 V
Lead Channel Pacing Threshold Pulse Width: 0.4 ms
Lead Channel Pacing Threshold Pulse Width: 1 ms
Lead Channel Sensing Intrinsic Amplitude: 2.5 mV
Lead Channel Sensing Intrinsic Amplitude: 2.5 mV
Lead Channel Setting Pacing Amplitude: 2 V
Lead Channel Setting Pacing Amplitude: 2.5 V
Lead Channel Setting Pacing Amplitude: 6 V
Lead Channel Setting Pacing Pulse Width: 0.4 ms
Lead Channel Setting Pacing Pulse Width: 1 ms
Lead Channel Setting Sensing Sensitivity: 4 mV

## 2019-01-12 ENCOUNTER — Ambulatory Visit: Payer: Self-pay | Admitting: Adult Health

## 2019-01-12 NOTE — Telephone Encounter (Signed)
See note

## 2019-01-12 NOTE — Telephone Encounter (Signed)
He would be ok taking tylenol for a few days and if that does not work then can use low dose motrin. He can also try using a heating pad or sports creams.

## 2019-01-12 NOTE — Telephone Encounter (Signed)
Pt's wife calling, questioning what is best for him to take for lower back pain?  "Hurts from lying in bed so much since he was in ED." Asked to speak with pt, pt states "I had some lower abdominal pain because I didn't have a bowel movement for 2 days,  Did today and it's mostly better." Rates at 3/10. Advised wife calling states that he is having back pain and she wants to know what he can take. Pt is evasive historian and reluctant to continue triage.  Advised [place] would route to practice for C. Nafziger's review. Advised ED if symptoms worsen.  Verbalizes understanding.  CB# [phone]  Answer Assessment - Initial Assessment Questions 1. REASON FOR CALL or QUESTION: "What is your reason for calling today?" or "How can I best help you?" or "What question do you have that I can help answer?"    What is safe for him to take for back pain?  Protocols used: INFORMATION ONLY CALL-A-AH

## 2019-01-13 NOTE — Telephone Encounter (Signed)
Spoke to the pt and advised of below message.  He will try recommendations and call back if needed.  Nothing further required.

## 2019-01-17 ENCOUNTER — Ambulatory Visit (INDEPENDENT_AMBULATORY_CARE_PROVIDER_SITE_OTHER): Payer: Medicare Other | Admitting: Family Medicine

## 2019-01-17 ENCOUNTER — Encounter: Payer: Self-pay | Admitting: Family Medicine

## 2019-01-17 ENCOUNTER — Other Ambulatory Visit: Payer: Self-pay

## 2019-01-17 ENCOUNTER — Ambulatory Visit (INDEPENDENT_AMBULATORY_CARE_PROVIDER_SITE_OTHER): Payer: Medicare Other

## 2019-01-17 ENCOUNTER — Telehealth: Payer: Self-pay | Admitting: *Deleted

## 2019-01-17 VITALS — BP 120/60 | HR 78 | Temp 97.3°F | Ht 64.0 in | Wt 140.8 lb

## 2019-01-17 DIAGNOSIS — K5900 Constipation, unspecified: Secondary | ICD-10-CM

## 2019-01-17 DIAGNOSIS — E871 Hypo-osmolality and hyponatremia: Secondary | ICD-10-CM

## 2019-01-17 DIAGNOSIS — R1010 Upper abdominal pain, unspecified: Secondary | ICD-10-CM | POA: Diagnosis not present

## 2019-01-17 LAB — CBC WITH DIFFERENTIAL/PLATELET
Basophils Absolute: 0.1 10*3/uL (ref 0.0–0.1)
Basophils Relative: 0.6 % (ref 0.0–3.0)
Eosinophils Absolute: 0.2 10*3/uL (ref 0.0–0.7)
Eosinophils Relative: 1.3 % (ref 0.0–5.0)
HCT: 36.5 % — ABNORMAL LOW (ref 39.0–52.0)
Hemoglobin: 12.3 g/dL — ABNORMAL LOW (ref 13.0–17.0)
Lymphocytes Relative: 6.5 % — ABNORMAL LOW (ref 12.0–46.0)
Lymphs Abs: 0.8 10*3/uL (ref 0.7–4.0)
MCHC: 33.8 g/dL (ref 30.0–36.0)
MCV: 91.8 fl (ref 78.0–100.0)
Monocytes Absolute: 0.9 10*3/uL (ref 0.1–1.0)
Monocytes Relative: 6.8 % (ref 3.0–12.0)
Neutro Abs: 10.6 10*3/uL — ABNORMAL HIGH (ref 1.4–7.7)
Neutrophils Relative %: 84.8 % — ABNORMAL HIGH (ref 43.0–77.0)
Platelets: 278 10*3/uL (ref 150.0–400.0)
RBC: 3.97 Mil/uL — ABNORMAL LOW (ref 4.22–5.81)
RDW: 14.1 % (ref 11.5–15.5)
WBC: 12.5 10*3/uL — ABNORMAL HIGH (ref 4.0–10.5)

## 2019-01-17 LAB — COMPREHENSIVE METABOLIC PANEL
ALT: 37 U/L (ref 0–53)
AST: 37 U/L (ref 0–37)
Albumin: 3.4 g/dL — ABNORMAL LOW (ref 3.5–5.2)
Alkaline Phosphatase: 125 U/L — ABNORMAL HIGH (ref 39–117)
BUN: 21 mg/dL (ref 6–23)
CO2: 28 mEq/L (ref 19–32)
Calcium: 8.6 mg/dL (ref 8.4–10.5)
Chloride: 98 mEq/L (ref 96–112)
Creatinine, Ser: 1.26 mg/dL (ref 0.40–1.50)
GFR: 53.9 mL/min — ABNORMAL LOW (ref >60.00)
Glucose, Bld: 142 mg/dL — ABNORMAL HIGH (ref 70–99)
Potassium: 4.5 mEq/L (ref 3.5–5.1)
Sodium: 134 mEq/L — ABNORMAL LOW (ref 135–145)
Total Bilirubin: 0.5 mg/dL (ref 0.2–1.2)
Total Protein: 6.2 g/dL (ref 6.0–8.3)

## 2019-01-17 LAB — LIPASE: Lipase: 25 U/L (ref 11.0–59.0)

## 2019-01-17 NOTE — Telephone Encounter (Signed)
-----   Message from Caren Macadam, MD sent at 01/17/2019  2:40 PM EST ----- I thought I would have xray result/some labs back yet, but I do not. I will try to check later, but wanted to advise in the meanwhile to go ahead and start miralax as directed with 8 oz of water. Please plan to do this today and tomorrow and hopefully we will have all results back tomorrow (or later today but can call tomorrow).

## 2019-01-17 NOTE — Telephone Encounter (Signed)
I spoke with Luis [facility] and informed her of the message below.  She agreed to follow instructions as directed.

## 2019-01-17 NOTE — Progress Notes (Signed)
Luis Townsend DOB: 18-Apr-1928 Encounter date: 01/17/2019  This is a 85 y.o. male who presents with Chief Complaint  Patient presents with  . Constipation    patient states he has not had a BM x4-5 days, used OTC meds with no relief  . Shortness of Breath    patient states he has COPD but shortness of breath has been worse lately, states he is on an antibiotic now from ER visit 2 weeks ago, cannot recall name of Rx  . Abdominal Pain    x2 days  . Back Pain    History of present illness: Basic problem is constipation. They have tried MOM, stool softeners, tried enema. NO success with any of these. Was doing ok until he began to get pains in abdomen.  Not currently having pain.  Feels more distended. Does unbuckle pants when he is sitting to watch tv.   Lower back hurts. Feels like it goes along with the stool. Uncomfortable, bloated. Not nauseated. No vomiting. Has been taking tylenol for the pain.  Feels like distended abdomen is making getting a deep breath harder for him.  No Known Allergies Current Meds  Medication Sig  . albuterol (VENTOLIN HFA) 108 (90 Base) MCG/ACT inhaler INHALE 2 PUFFS BY MOUTH EVERY 4 HOURS AS NEEDED (Patient taking differently: Inhale 2 puffs into the lungs every 4 (four) hours as needed for wheezing or shortness of breath. )  . aspirin EC 81 MG tablet Take 81 mg by mouth daily.  . carvedilol (COREG) 12.5 MG tablet Take 1 tablet (12.5 mg total) by mouth 2 (two) times daily.  Marland Kitchen ezetimibe (ZETIA) 10 MG tablet TAKE 1 TABLET BY MOUTH EVERY DAY (Patient taking differently: Take 10 mg by mouth daily. )  . Fluticasone-Umeclidin-Vilant (TRELEGY ELLIPTA) 100-62.5-25 MCG/INH AEPB Inhale 1 puff into the lungs daily.  . furosemide (LASIX) 20 MG tablet TAKE 1 TABLET BY MOUTH EVERY DAY (Patient taking differently: Take 20 mg by mouth daily. )  . rosuvastatin (CRESTOR) 40 MG tablet TAKE 1 TABLET BY MOUTH EVERY DAY (Patient taking differently: Take 40 mg by mouth at bedtime.  )  . [DISCONTINUED] irbesartan (AVAPRO) 75 MG tablet Take 1 tablet (75 mg total) daily by mouth. (Patient taking differently: Take 37.5 mg by mouth daily. )  . [DISCONTINUED] tadalafil (CIALIS) 5 MG tablet Take 1 tablet (5 mg total) by mouth daily as needed for erectile dysfunction.    Review of Systems  Constitutional: Negative for chills, fatigue and fever.  Respiratory: Negative for cough, chest tightness, shortness of breath and wheezing.   Cardiovascular: Negative for chest pain, palpitations and leg swelling.  Gastrointestinal: Positive for abdominal distention and constipation. Negative for blood in stool, diarrhea, nausea, rectal pain and vomiting. Abdominal pain: uncomfortable upper abd.    Objective:  BP 120/60 (BP Location: Left Arm, Patient Position: Sitting, Cuff Size: Normal)   Pulse 78   Temp (!) 97.3 F (36.3 C) (Temporal)   Ht 5\' 4"  (1.626 m)   Wt 140 lb 12.8 oz (63.9 kg)   SpO2 94%   BMI 24.17 kg/m   Weight: 140 lb 12.8 oz (63.9 kg)   BP Readings from Last 3 Encounters:  01/17/19 120/60  01/05/19 (!) 110/51  09/13/18 (!) 146/78   Wt Readings from Last 3 Encounters:  01/17/19 140 lb 12.8 oz (63.9 kg)  09/13/18 141 lb 3.2 oz (64 kg)  04/14/18 147 lb (66.7 kg)    Physical Exam Constitutional:      General:  He is not in acute distress.    Appearance: He is well-developed.  Cardiovascular:     Rate and Rhythm: Normal rate and regular rhythm.     Heart sounds: Normal heart sounds. No murmur. No friction rub.  Pulmonary:     Effort: Pulmonary effort is normal. No respiratory distress.     Breath sounds: Normal breath sounds. No wheezing or rales.  Abdominal:     General: Bowel sounds are decreased.     Palpations: Abdomen is soft. There is no fluid wave, hepatomegaly or splenomegaly.     Tenderness: There is no abdominal tenderness. There is no right CVA tenderness, left CVA tenderness, guarding or rebound.  Genitourinary:    Prostate: Enlarged. Not  tender.     Rectum: Normal. No mass.  Musculoskeletal:     Right lower leg: No edema.     Left lower leg: No edema.  Neurological:     Mental Status: He is alert and oriented to person, place, and time.  Psychiatric:        Behavior: Behavior normal.     Assessment/Plan 1. Constipation, unspecified constipation type Before recommending further treatment, I wanted to get some blood work and imaging done.  He does have a follow-up with pulmonology on Wednesday for recent findings on x-ray and CT scan of left lower lobe masslike consolidation.  He has completed antibiotic treatment.  Constipation started with antibiotic treatment. - CBC with Differential/Platelet; Future - CBC with Differential/Platelet - DG Abd 1 View; Future  2. Hyponatremia We will recheck to make sure that this has normalized. - Comprehensive metabolic panel; Future - Comprehensive metabolic panel  3. Pain of upper abdomen - Lipase; Future - Lipase   Return for pending xray and bloodwork.     Micheline Rough, MD

## 2019-01-18 ENCOUNTER — Other Ambulatory Visit: Payer: Self-pay | Admitting: Internal Medicine

## 2019-01-18 DIAGNOSIS — J449 Chronic obstructive pulmonary disease, unspecified: Secondary | ICD-10-CM

## 2019-01-18 LAB — CUP PACEART REMOTE DEVICE CHECK
Date Time Interrogation Session: 20201208072653
Implantable Lead Implant Date: 20131002
Implantable Lead Implant Date: 20131002
Implantable Lead Implant Date: 20171019
Implantable Lead Location: 753858
Implantable Lead Location: 753859
Implantable Lead Location: 753860
Implantable Lead Model: 4298
Implantable Lead Model: 5076
Implantable Lead Model: 5076
Implantable Pulse Generator Implant Date: 20171019

## 2019-01-19 ENCOUNTER — Ambulatory Visit (INDEPENDENT_AMBULATORY_CARE_PROVIDER_SITE_OTHER): Payer: Medicare Other | Admitting: Internal Medicine

## 2019-01-19 ENCOUNTER — Encounter: Payer: Self-pay | Admitting: Internal Medicine

## 2019-01-19 ENCOUNTER — Ambulatory Visit (INDEPENDENT_AMBULATORY_CARE_PROVIDER_SITE_OTHER): Payer: Medicare Other

## 2019-01-19 ENCOUNTER — Other Ambulatory Visit: Payer: Self-pay

## 2019-01-19 ENCOUNTER — Telehealth: Payer: Self-pay | Admitting: *Deleted

## 2019-01-19 DIAGNOSIS — I25810 Atherosclerosis of coronary artery bypass graft(s) without angina pectoris: Secondary | ICD-10-CM | POA: Diagnosis not present

## 2019-01-19 DIAGNOSIS — J449 Chronic obstructive pulmonary disease, unspecified: Secondary | ICD-10-CM

## 2019-01-19 DIAGNOSIS — J189 Pneumonia, unspecified organism: Secondary | ICD-10-CM | POA: Diagnosis not present

## 2019-01-19 MED ORDER — ANORO ELLIPTA 62.5-25 MCG/INH IN AEPB: 2.0000 | INHALATION_SPRAY | Freq: Once | RESPIRATORY_TRACT | 0 refills | Status: DC

## 2019-01-19 MED ORDER — ANORO ELLIPTA 62.5-25 MCG/INH IN AEPB: INHALATION_SPRAY | RESPIRATORY_TRACT | 11 refills | Status: DC

## 2019-01-19 MED ORDER — AMOXICILLIN-POT CLAVULANATE 875-125 MG PO TABS: 1.0000 | ORAL_TABLET | Freq: Two times a day (BID) | ORAL | 0 refills | Status: AC

## 2019-01-19 NOTE — Telephone Encounter (Signed)
ATC, NA and no option to leave msg 

## 2019-01-19 NOTE — Assessment & Plan Note (Signed)
Onset of symptoms late Nov 2020 > rx augmentin 01/05/19 x 10 d - decreased as dz  on PA view 01/19/2019 > rec20 more days and f/u in 3 weeks off ICS if tolerates  Absence of wbc/ fever/ productive cough is against pna and in favor of lung mass/ bronchogenic Ca but cxr def improved p augmentin and he would be a very poor candidate for any form of surgical intervention due to age and lung dz so rec 20 more days augmentin  And repeat cxr then consider PET p first of year.   Discussed in detail all the  indications, usual  risks and alternatives  relative to the benefits with patient who agrees to proceed with Rx as outlined.      I had an extended discussion with the patient reviewing all relevant studies completed to date and  lasting 25 minutes of a 40  minute extended post ER office visit addressing non-specific but potentially very serious refractory respiratory symptoms of uncertain and potentially multiple  etiologies.   Each maintenance medication was reviewed in detail including most importantly the difference between maintenance and prns and under what circumstances the prns are to be triggered using an action plan format that is not reflected in the computer generated alphabetically organized AVS.    Please see AVS for specific instructions unique to this office visit that I personally wrote and verbalized to the the pt in detail and then reviewed with pt  by my nurse highlighting any changes in therapy/plan of care  recommended at today's visit.

## 2019-01-19 NOTE — Telephone Encounter (Signed)
-----   Message from Tanda Rockers, MD sent at 01/19/2019  4:52 PM EST ----- Change trelegy to anoro one click daily on trial basis until returns

## 2019-01-19 NOTE — Patient Instructions (Addendum)
Sleep on on your tummy as much as you can   Augmentin 875 mg take one pill twice daily  X 20 days - take at breakfast and supper with large glass of water.  It would help reduce the usual side effects (diarrhea and yeast infections) if you ate cultured yogurt at lunch.   Stop atrovent   Plan A = Automatic = Always=   Trelegy each am   Plan B = Backup (to supplement plan A, not to replace it) Only use your albuterol inhaler as a rescue medication to be used if you can't catch your breath by resting or doing a relaxed purse lip breathing pattern.  - The less you use it, the better it will work when you need it. - Ok to use the inhaler up to 2 puffs  every 4 hours if you must but call for appointment if use goes up over your usual need - Don't leave home without it !!  (think of it like the spare tire for your car)    Please schedule a follow up office visit in 3 weeks, sooner if needed with cxr

## 2019-01-19 NOTE — Progress Notes (Signed)
Subjective:     Patient ID: TEVAN MARIAN, male   DOB: 09-09-1928,    MRN: [id number]     Brief patient profile:  70 yowm MM quit smoking  02/2011 no problem with sob/cough and new cough/sob started around 2015 and worse since  fall 2017 referred to pulmonary clinic 05/30/2016 by Dorothyann Peng  With ? Copd proved to have GOLD II evolving to III COPD,   .History of Present Illness  05/30/2016 1st [facility] office visit/ Halley Shepheard  On ACEi / symbicort  Chief Complaint  Patient presents with  . Pulmonary Consult    Referred by Dorothyann Peng. Pt c/o SOB for the past 2-3 yrs, worse since Jan 2018. He reports he gets SOB walking a few hundred ft, for example waslking to his mailbox. He does fine at rest. He has also had cough that comes and goes. Cough is occ prod with clear sputum. He finds it difficult to produce any sputum lately.   acutely ill in fall 2017 uri/pna and never completely recovered with persistent moderatedly severe daily cough min production and much worse doe = MMRC3 = can't walk 100 yards even at a slow pace at a flat grade s stopping due to sob   rec Plan A = Automatic =  Symbicort 160  Take 2 puffs first thing in am and then another 2 puffs about 12 hours later.  Work on inhaler technique:   Plan B = Backup Only use your albuterol (ventolin)  stop altace and start avapro 150 mg one daily - break in half if too strong     07/11/2016  f/u ov/Jami Bogdanski re:  COPD  GOLD II / symb 160 2bid / off acei x 6 weeks  Chief Complaint  Patient presents with  . Follow-up    Breathing has improved and he is coughing less. He is using ventolin hfa 3-4 x per wk on average.     Less albuterol now, sleeping fine s am exac  Doe = MMRC2 = can't walk a nl pace on a flat grade s sob but does fine slow and flat  rec When you finish the present symbicort 160 change to 80 2 pffs every 12 hours  Work on inhaler technique:  Remember to pace yourself    09/13/2018  f/u ov/Audon Heymann re: GOLD III on  symbicort 160 2bid  Chief Complaint  Patient presents with  . Follow-up    Pt states his breathing is unchanged since last OV. Pt states he is using his rescue inhaler at least once daily. Pt denies cough, CP/tightness, f/c/s.   Dyspnea:  MMRC3= can't walk a slow pace on a flat grade s sob but does fine slow and flat would like to walk 2 blocks stopping  Cough: none SABA use: p ex never rechallenges 02: none  rec Trilegy one click each first thing x 2 good long drags to get all the medication  Only use your albuterol as a rescue medication   01/05/2019  ER eval hx acutely sob x 2 h and "chronic cough"  Rx   augmentin 875 bid x 10 days    01/19/2019  f/u ov/Christo Hain re: ? Lung mass sup segment LLL vs rounded pna  / GOLD III on trelegy  Chief Complaint  Patient presents with  . Follow-up    went to ED with weakness and SOB 01/05/19. He states that his breathing has improved since then.   dental implant summery 2020 for broken took  Dyspnea:  Improved but not back wallking 2 blocks / some fatigue over baseline  Cough: none - denies any sputum prod/ fever/ cp Sleeping: no resp symptoms SABA use: atrovent only  02: none  Appetite improving, no dysphagia, no recent dental symptoms, or loc/ asp events    No obvious day to day or daytime variability or assoc excess/ purulent sputum or mucus plugs or hemoptysis or cp or chest tightness, subjective wheeze or overt sinus or hb symptoms.   Sleeping  without nocturnal  or early am exacerbation  of respiratory  c/o's or need for noct saba. Also denies any obvious fluctuation of symptoms with weather or environmental changes or other aggravating or alleviating factors except as outlined above   No unusual exposure hx or h/o childhood pna/ asthma or knowledge of premature birth.  Current Allergies, Complete Past Medical History, Past Surgical History, Family History, and Social History were reviewed in [employer]  record.  ROS  The following are not active complaints unless bolded Hoarseness, sore throat, dysphagia, dental problems, itching, sneezing,  nasal congestion or discharge of excess mucus or purulent secretions, ear ache,   fever, chills, sweats, unintended wt loss or wt gain, classically pleuritic or exertional cp,  orthopnea pnd or arm/hand swelling  or leg swelling, presyncope, palpitations, abdominal pain, anorexia, nausea, vomiting, diarrhea  or change in bowel habits or change in bladder habits, change in stools or change in urine, dysuria, hematuria,  rash, arthralgias, visual complaints, headache, numbness, weakness or ataxia or problems with walking or coordination,  change in mood or  memory.        Current Meds  Medication Sig  . aspirin EC 81 MG tablet Take 81 mg by mouth daily.  Marland Kitchen ezetimibe (ZETIA) 10 MG tablet TAKE 1 TABLET BY MOUTH EVERY DAY (Patient taking differently: Take 10 mg by mouth daily. )  . Fluticasone-Umeclidin-Vilant (TRELEGY ELLIPTA) 100-62.5-25 MCG/INH AEPB Inhale 1 puff into the lungs daily.  . furosemide (LASIX) 20 MG tablet TAKE 1 TABLET BY MOUTH EVERY DAY (Patient taking differently: Take 20 mg by mouth daily. )  . ipratropium (ATROVENT HFA) 17 MCG/ACT inhaler Inhale 2 puffs into the lungs every 6 (six) hours.  . rosuvastatin (CRESTOR) 40 MG tablet TAKE 1 TABLET BY MOUTH EVERY DAY (Patient taking differently: Take 40 mg by mouth at bedtime. )                    Objective:   Physical Exam    amb elderly wm nad   01/19/2019       141  09/13/2018         145  10/02/2017       142 07/02/2017       146  05/26/2017       147   07/11/2016        142   05/30/16 143 lb (64.9 kg)  04/29/16 147 lb (66.7 kg)  04/15/16 148 lb (67.1 kg)    Vital signs reviewed - Note on arrival 02 sats  95% on RA     HEENT : pt wearing mask not removed for exam due to covid - 19 concerns.    NECK :  without JVD/Nodes/TM/ nl carotid upstrokes bilaterally   LUNGS: no acc  muscle use,  Mild barrel  contour chest wall with bilateral  Distant bs s audible wheeze and  without cough on insp or exp maneuvers  and mild  Hyperresonant  to  percussion bilaterally  -  no amphoric change, no bronchial bs or localized wheeze on L    CV:  RRR  no s3 or murmur or increase in P2, and no edema   ABD:  soft and nontender with pos end  insp Hoover's  in the supine position. No bruits or organomegaly appreciated, bowel sounds nl  MS:   Nl gait/  ext warm without deformities, calf tenderness, cyanosis or clubbing No obvious joint restrictions   SKIN: warm and dry without lesions    NEURO:  alert, approp, nl sensorium with  no motor or cerebellar deficits apparent.          Labs ordered/ reviewed:      Chemistry      Component Value Date/Time   NA 134 (L) 01/17/2019 1129   NA 141 07/10/2017 1048   K 4.5 01/17/2019 1129   CL 98 01/17/2019 1129   CO2 28 01/17/2019 1129   BUN 21 01/17/2019 1129   BUN 23 07/10/2017 1048   CREATININE 1.26 01/17/2019 1129   CREATININE 1.14 (H) 11/26/2015 1118      Component Value Date/Time   CALCIUM 8.6 01/17/2019 1129   ALKPHOS 125 (H) 01/17/2019 1129   AST 37 01/17/2019 1129   ALT 37 01/17/2019 1129   BILITOT 0.5 01/17/2019 1129   BILITOT 0.4 07/10/2017 1048        Lab Results  Component Value Date   WBC 12.5 (H) 01/17/2019   HGB 12.3 (L) 01/17/2019   HCT 36.5 (L) 01/17/2019   MCV 91.8 01/17/2019   PLT 278.0 01/17/2019       EOS                                                              0.2                                      01/19/2019       CXR PA and Lateral:   01/19/2019 :    I personally reviewed images and agree with radiology impression as follows:   1. Persistent 6 cm superior segment left lower lobe masslike opacity which remains concerning for malignancy. 2. Persistent patchy left lower lobe infiltrates more inferiorly with a small left pleural effusion. 3. Emphysema. My impression:  Marked improvement  in LLL as dz vs 01/05/19              Assessment:

## 2019-01-19 NOTE — Assessment & Plan Note (Signed)
Quit smoking 2013 - PFT's  07/09/11  FEV1 1.5 (69 % ) ratio 51  p 21 % improvement from saba p ? prior to study with DLCO  64/65c % corrects to 74 % for alv volume   - 05/30/2016    try change from advair to symbicort 160 - Spirometry 05/08/2017  FEV1 0.73 (35%)  Ratio 47 ? p am symb 160 > added spiriva   - 05/26/2017  After extensive coaching inhaler device  effectiveness =    75% from a baseline of < 50%  - PFT's  07/02/2017  FEV1 0.87 (44 % ) ratio 40  p 21 % improvement from saba p 30/31 prior to study with DLCO  30/31 % corrects to 50 % for alv volume   - 07/02/2017    changed to stiolto 2 pffs each am > not covered - alpha one screening 07/02/2017 >>  MM  Level 167  - 10/02/2017  After extensive coaching inhaler device,  effectiveness =    90% from baseline 75% and doing fine on just symb 160 2bid so f/u prn - 09/13/2018  After extensive coaching inhaler device,  effectiveness =    90% with elipta so try trelegy one click each am   - 55/10/7414 rec change to anoro due to concerns re pna   Using atrovent as prn which is redundant while on lama and risks anticholinergic side effects > change to prn saba

## 2019-01-20 ENCOUNTER — Telehealth: Payer: Self-pay | Admitting: Internal Medicine

## 2019-01-20 ENCOUNTER — Telehealth: Payer: Self-pay | Admitting: Cardiology

## 2019-01-20 MED ORDER — ANORO ELLIPTA 62.5-25 MCG/INH IN AEPB: 1.0000 | INHALATION_SPRAY | Freq: Every day | RESPIRATORY_TRACT | 0 refills | Status: DC

## 2019-01-20 NOTE — Telephone Encounter (Signed)
Follow Up:   Pt is returning Sherry's call from yesterday. It was concerning getting his battery changes in his device.

## 2019-01-20 NOTE — Telephone Encounter (Signed)
I called and spoke with patient's wife Horris Latino and made her aware that Dr. Melvyn Novas wanted to change him from Trelegy to [facility]. I advised her that I would place some samples up front for her to pick up and when he returns to the office on 12/30 he will let Dr. Melvyn Novas know how he did on the switch. She verbalized understanding. Nothing further is needed.

## 2019-01-20 NOTE — Telephone Encounter (Signed)
Pt aware  Per other phone note will try samples first  Nothing further needed

## 2019-01-21 NOTE — Telephone Encounter (Signed)
Informed wife that pt pacemaker battery has not reached ERI yet, but that he will soon.  Explained this to wife. Informed that we will be doing monthly monitoring until he reaches ERI. Aware I will be in touch sometime beginning of next year to re-address battery change need. Wife verbalized understanding and agreeable to plan.

## 2019-01-24 ENCOUNTER — Telehealth: Payer: Self-pay | Admitting: *Deleted

## 2019-01-24 ENCOUNTER — Telehealth: Payer: Self-pay | Admitting: Adult Health

## 2019-01-24 NOTE — Telephone Encounter (Signed)
Pt wife wanted Cory's nurse to call as husband is still experiencing quite a bit of pain and wants to speak with her about the possibility of getting him in with a specialist. Wants to talk specifically to Cory's nurse  [phone]

## 2019-01-24 NOTE — Telephone Encounter (Signed)
Patient called after hours line on 12:13 at 4:57PM. Patient reports he was seen for constipation a week ago. Caller states patient was told to take Mira lax and it only helped a little. Caller states she gave patient an enema and that helped some more. Caller states patient is still bloated and his back is hurting.   On call advised for patient to take another dose of miralax now and then another dose before bedtime (if patient has only had one dose today. He can have up to 2 or 3). On-call advised patient can also try a glycerin suppository. Patient may take tylenol for back pain. On-call also advised patient can go to to the ER if he can't get relief or if he is too uncomfortable.   Forwarding to PCP as a FYI.

## 2019-01-25 ENCOUNTER — Telehealth: Payer: Self-pay | Admitting: *Deleted

## 2019-01-25 ENCOUNTER — Other Ambulatory Visit: Payer: Self-pay | Admitting: Family Medicine

## 2019-01-25 DIAGNOSIS — R1084 Generalized abdominal pain: Secondary | ICD-10-CM

## 2019-01-25 DIAGNOSIS — K5900 Constipation, unspecified: Secondary | ICD-10-CM

## 2019-01-25 NOTE — Telephone Encounter (Signed)
Copied from [place] [id number]. Topic: General - Call Back - No Documentation >> Jan 24, 2019  5:26 PM Erick Blinks wrote: Reason for CRM: Pt's wife called requesting a call back from a nurse  Best contact: [phone]

## 2019-01-25 NOTE — Telephone Encounter (Signed)
I am ok with GI referral, but they have been busy and I don't feel confident they can get him in soon enough.   Unfortunately I am not in office today to examine him, but I did go ahead and order a stat CT abd/pelvis since this has been going on for some time. Please get more details regarding discomfort and see if we are able to get that imaging done today to help facilitate work up.   IF his pain is worse or he is having fever, chills, vomiting then he needs to go to ER where they will repeat bloodwork and complete imaging there.   If there is response that needs to be addressed today, please send to in office covering provider. Thanks!

## 2019-01-25 NOTE — Telephone Encounter (Signed)
Spoke to the pt's wife and informed her of the below message.  She will watch for the phone calls to come for the CT and for the referral.  She will take the pt to ED is sx worsen.  Nothing further needed.

## 2019-01-25 NOTE — Telephone Encounter (Signed)
Spoke to [place].  This message is not needed.  See other telephone encounter.

## 2019-01-26 ENCOUNTER — Encounter: Payer: Self-pay | Admitting: Physician Assistant

## 2019-01-26 ENCOUNTER — Telehealth: Payer: Self-pay

## 2019-01-26 NOTE — Telephone Encounter (Signed)
Copied from [place] [id number]. Topic: Quick Communication - See Telephone Encounter >> Jan 26, 2019  1:25 PM Loma Boston wrote: CRM for notification. See Telephone encounter for: 01/26/19. Just spoke with office, pt needs a muscle or something to do him until he gets CT next week, can not bear the pain. CB from [place] at [place]

## 2019-01-26 NOTE — Telephone Encounter (Signed)
Spoke to PCP CMA and was advised to tell pt to take OTC Tylenol, heating pad and Bengay. I was also advised to call pt to schedule an appt. For vv 01/27/2019.

## 2019-01-27 ENCOUNTER — Other Ambulatory Visit: Payer: Self-pay

## 2019-01-27 ENCOUNTER — Telehealth (INDEPENDENT_AMBULATORY_CARE_PROVIDER_SITE_OTHER): Payer: Medicare Other | Admitting: Adult Health

## 2019-01-27 DIAGNOSIS — K5900 Constipation, unspecified: Secondary | ICD-10-CM

## 2019-01-27 DIAGNOSIS — R1084 Generalized abdominal pain: Secondary | ICD-10-CM

## 2019-01-27 DIAGNOSIS — M545 Low back pain: Secondary | ICD-10-CM | POA: Diagnosis not present

## 2019-01-27 DIAGNOSIS — G8929 Other chronic pain: Secondary | ICD-10-CM | POA: Diagnosis not present

## 2019-01-27 MED ORDER — LACTULOSE 20 GM/30ML PO SOLN: 30.0000 mL | Freq: Every day | ORAL | 0 refills | Status: DC

## 2019-01-27 NOTE — Progress Notes (Signed)
Virtual Visit via Telephone Note  I connected with Luis Townsend on 01/27/19 at  3:30 PM EST by telephone and verified that I am speaking with the correct person using two identifiers.   I discussed the limitations, risks, security and privacy concerns of performing an evaluation and management service by telephone and the availability of in person appointments. I also discussed with the patient that there may be a patient responsible charge related to this service. The patient expressed understanding and agreed to proceed.  Location patient: home Location provider: work or home office Participants present for the call: patient, provider, wife Patient did not have a visit in the prior 7 days to address this/these issue(s).   History of Present Illness: 85 year old male who is being evaluated today for continued constipation.  He was originally seen 10 days ago by another provider in the office.  At that time he had not had a bowel movement in 4 to 5 days and was using over-the-counter meds with no relief.  He was also complaining of abdominal pain and bloating at this time as well as low back pain.  Labs were done which showed him mildly elevated alk phos as well as a mildly elevated WBC.  He had recently been started on antibiotics for a left lower lobe masslike consolidation. His Xray showed   IMPRESSION: Changes consistent with significant retained fecal material and Constipation.  Today he reports that he continues to have low back pain that is described as an ache.  Pain is 3-4 out of 10 and is consistent.  Tylenol makes it feel better.  He is also having issues with constipation, he did have a bowel movement this morning but this bowel movement was small in size.  He is passing gas but continues to feel bloated and have intermittent abdominal cramping.  He has been using MiraLAX for the last 10 days with no improvement.  Has also tried a glycerin suppository which helped.  He has an  upcoming CT of the abdomen and pelvis next Tuesday as well as an appointment with GI in early January.    Observations/Objective: Patient sounds cheerful and well on the phone. I do not appreciate any SOB. Speech and thought processing are grossly intact. Patient reported vitals:  Assessment and Plan: 1. Generalized abdominal pain -Likely due to constipation.  2. Constipation, unspecified constipation type -Encouraged to drink at least 6 glasses of water a day increase green leafy vegetables.  Will send in lactulose as over-the-counter regimens do not seem to be working.  He was advised to follow-up in the next few days if no improvement - Lactulose 20 GM/30ML SOLN; Take 30 mLs (20 g total) by mouth daily.  Dispense: 450 mL; Refill: 0  3. Chronic bilateral low back pain without sciatica -Continue to take Tylenol at this time.  We will see if back pain improves after constipation resolves  Dorothyann Peng, NP    Follow Up Instructions:   I did not refer this patient for an OV in the next 24 hours for this/these issue(s).  I discussed the assessment and treatment plan with the patient. The patient was provided an opportunity to ask questions and all were answered. The patient agreed with the plan and demonstrated an understanding of the instructions.   The patient was advised to call back or seek an in-person evaluation if the symptoms worsen or if the condition fails to improve as anticipated.  I provided 18 minutes of non-face-to-face time during this  encounter.   Dorothyann Peng, NP

## 2019-01-28 ENCOUNTER — Encounter: Payer: Self-pay | Admitting: Adult Health

## 2019-02-01 ENCOUNTER — Ambulatory Visit: Payer: Self-pay

## 2019-02-01 ENCOUNTER — Ambulatory Visit
Admission: RE | Admit: 2019-02-01 | Discharge: 2019-02-01 | Disposition: A | Discharge status: Home / Self Care | Payer: Medicare Other | Source: Ambulatory Visit | Attending: Family Medicine | Admitting: Family Medicine

## 2019-02-01 ENCOUNTER — Other Ambulatory Visit: Payer: Self-pay | Admitting: Adult Health

## 2019-02-01 ENCOUNTER — Other Ambulatory Visit: Payer: Self-pay

## 2019-02-01 DIAGNOSIS — R1084 Generalized abdominal pain: Secondary | ICD-10-CM

## 2019-02-01 MED ORDER — NAPROXEN 500 MG PO TABS: 500.0000 mg | ORAL_TABLET | Freq: Two times a day (BID) | ORAL | 0 refills | Status: AC

## 2019-02-01 MED ORDER — IOPAMIDOL (ISOVUE-300) INJECTION 61%
100.0000 mL | Freq: Once | INTRAVENOUS | Status: AC | PRN
  Administered 2019-02-01: 100 mL via INTRAVENOUS

## 2019-02-01 NOTE — Telephone Encounter (Signed)
Mr.and Mrs. Hem informed of message to cut dose in half and will call back in 1 week.  Pt continues to have terrible back pain.  Has been taking Tylenol with no relief.  Mrs. Stutz would like to know if Luis Townsend can send in something stronger.  Please advise.

## 2019-02-01 NOTE — Telephone Encounter (Signed)
Mrs. Robinette notified that rx has been sent to the pharmacy.

## 2019-02-01 NOTE — Telephone Encounter (Signed)
MyChart message routed to pt to address back pain.  This message is not needed.  Will close.

## 2019-02-01 NOTE — Telephone Encounter (Signed)
Message Routed to PCP CMA 

## 2019-02-01 NOTE — Telephone Encounter (Signed)
Pt's wife stated pt is still having terrible back pain and the Tylenol is not helping. They would like to request something stronger. Please advise.

## 2019-02-01 NOTE — Telephone Encounter (Signed)
[facility] cxr (Accession [id number]) (Order [id number]) Imaging Date: 01/19/2019 Department: [facility] Released By: Murtis Sink, RTR Authorizing: Tanda Rockers, MD  Exam Status  Status  Final [99]  PACS Intelerad Image Link  Show images for Luis Townsend cxr  Study Result  CLINICAL DATA:  COPD. Lung mass.  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 A pacemaker remains in place. The cardiomediastinal silhouette is unchanged with normal heart size. There is a persistent 6 cm masslike opacity centered in the superior segment of the left lower lobe, similar to the prior studies. Patchy opacities persist more inferiorly in the left lower lobe as well. There is a small persistent left pleural effusion. There is a background of lung hyperinflation with emphysema. No pneumothorax is identified. A chronic, benign-appearing lesion is again seen in the proximal left humerus. There are chronic compression fractures in the mid and lower thoracic spine.  IMPRESSION: 1. Persistent 6 cm superior segment left lower lobe masslike opacity which remains concerning for malignancy. 2. Persistent patchy left lower lobe infiltrates more inferiorly with a small left pleural effusion. 3. Emphysema.   Electronically Signed   By: Logan Bores M.D.   On: 01/19/2019 10:39   Result History  Luis Townsend cxr (Order #[id number]) on 01/19/2019 - Order Result History Report  Luis Townsend cxr: Result Notes  Tanda Rockers, MD  01/19/2019 11:00 AM EST    Discussed with patient at [facility]       MyChart Results Release  MyChart Status: Active Results Release  Encounter-Level Documents:  There are no encounter-level documents. Order-Level Documents:  There are no order-level documents. Vitals  Height Weight BMI (Calculated)  5\' 4"  (1.626 m) 141 lb (64 kg) 24.19  Imaging  Imaging  Information  Resulted by:  Signed Date/Time  Phone Pager  Logan Bores 01/19/2019 10:39 AM [phone]   Luis Townsend cxr: Result Notes  Tanda Rockers, MD  01/19/2019 11:00 AM EST    Discussed with patient at ov       Study Notes   Recchio, Heather B, RTR on 01/19/2019 10:28 AM  COPD and lung mass follow up.  Hx HTN.    Original Order  Ordered On Ordered By   01/18/2019 11:41 AM Rosana Berger, CMA       External Result Report  External Result Report   racy from [facility].

## 2019-02-02 ENCOUNTER — Telehealth: Payer: Self-pay | Admitting: Adult Health

## 2019-02-02 NOTE — Telephone Encounter (Signed)
Updated family on recent CT scan.   CT results have been forwarded to pulmonary and GI as well

## 2019-02-03 ENCOUNTER — Other Ambulatory Visit: Payer: Self-pay | Admitting: Adult Health

## 2019-02-03 DIAGNOSIS — K5900 Constipation, unspecified: Secondary | ICD-10-CM

## 2019-02-05 NOTE — Progress Notes (Signed)
PPM Remote  

## 2019-02-08 ENCOUNTER — Other Ambulatory Visit: Payer: Self-pay | Admitting: Internal Medicine

## 2019-02-08 DIAGNOSIS — J189 Pneumonia, unspecified organism: Secondary | ICD-10-CM

## 2019-02-08 NOTE — Telephone Encounter (Signed)
Denied.  Pt is no longer using as he is now having bowel movements.  Nothing further needed.

## 2019-02-09 ENCOUNTER — Ambulatory Visit (INDEPENDENT_AMBULATORY_CARE_PROVIDER_SITE_OTHER): Payer: Medicare Other

## 2019-02-09 ENCOUNTER — Encounter: Payer: Self-pay | Admitting: Internal Medicine

## 2019-02-09 ENCOUNTER — Other Ambulatory Visit: Payer: Self-pay

## 2019-02-09 ENCOUNTER — Ambulatory Visit (INDEPENDENT_AMBULATORY_CARE_PROVIDER_SITE_OTHER): Payer: Medicare Other | Admitting: Internal Medicine

## 2019-02-09 DIAGNOSIS — J90 Pleural effusion, not elsewhere classified: Secondary | ICD-10-CM

## 2019-02-09 DIAGNOSIS — J189 Pneumonia, unspecified organism: Secondary | ICD-10-CM

## 2019-02-09 DIAGNOSIS — J449 Chronic obstructive pulmonary disease, unspecified: Secondary | ICD-10-CM | POA: Diagnosis not present

## 2019-02-09 DIAGNOSIS — R918 Other nonspecific abnormal finding of lung field: Secondary | ICD-10-CM | POA: Diagnosis not present

## 2019-02-09 DIAGNOSIS — I25810 Atherosclerosis of coronary artery bypass graft(s) without angina pectoris: Secondary | ICD-10-CM

## 2019-02-09 DIAGNOSIS — R911 Solitary pulmonary nodule: Secondary | ICD-10-CM | POA: Diagnosis not present

## 2019-02-09 LAB — CBC WITH DIFFERENTIAL/PLATELET
Basophils Absolute: 0.1 10*3/uL (ref 0.0–0.1)
Basophils Relative: 0.5 % (ref 0.0–3.0)
Eosinophils Absolute: 0.3 10*3/uL (ref 0.0–0.7)
Eosinophils Relative: 2.7 % (ref 0.0–5.0)
HCT: 34.1 % — ABNORMAL LOW (ref 39.0–52.0)
Hemoglobin: 11.4 g/dL — ABNORMAL LOW (ref 13.0–17.0)
Lymphocytes Relative: 7.4 % — ABNORMAL LOW (ref 12.0–46.0)
Lymphs Abs: 0.8 10*3/uL (ref 0.7–4.0)
MCHC: 33.6 g/dL (ref 30.0–36.0)
MCV: 92.9 fl (ref 78.0–100.0)
Monocytes Absolute: 1.1 10*3/uL — ABNORMAL HIGH (ref 0.1–1.0)
Monocytes Relative: 10.3 % (ref 3.0–12.0)
Neutro Abs: 8.6 10*3/uL — ABNORMAL HIGH (ref 1.4–7.7)
Neutrophils Relative %: 79.1 % — ABNORMAL HIGH (ref 43.0–77.0)
Platelets: 253 10*3/uL (ref 150.0–400.0)
RBC: 3.67 Mil/uL — ABNORMAL LOW (ref 4.22–5.81)
RDW: 15.7 % — ABNORMAL HIGH (ref 11.5–15.5)
WBC: 10.8 10*3/uL — ABNORMAL HIGH (ref 4.0–10.5)

## 2019-02-09 LAB — BASIC METABOLIC PANEL
BUN: 30 mg/dL — ABNORMAL HIGH (ref 6–23)
CO2: 30 mEq/L (ref 19–32)
Calcium: 8.5 mg/dL (ref 8.4–10.5)
Chloride: 99 mEq/L (ref 96–112)
Creatinine, Ser: 0.98 mg/dL (ref 0.40–1.50)
GFR: 72.03 mL/min (ref >60.00)
Glucose, Bld: 120 mg/dL — ABNORMAL HIGH (ref 70–99)
Potassium: 4.2 mEq/L (ref 3.5–5.1)
Sodium: 136 mEq/L (ref 135–145)

## 2019-02-09 LAB — SEDIMENTATION RATE: Sed Rate: 24 mm/hr — ABNORMAL HIGH (ref 0–20)

## 2019-02-09 MED ORDER — TRELEGY ELLIPTA 100-62.5-25 MCG/INH IN AEPB: 1.0000 | INHALATION_SPRAY | Freq: Every day | RESPIRATORY_TRACT | 0 refills | Status: AC

## 2019-02-09 NOTE — Patient Instructions (Addendum)
Plan A = Automatic = Always=   Trelegy each am   Plan B = Backup (to supplement plan A, not to replace it) Only use your albuterol inhaler as a rescue medication to be used if you can't catch your breath by resting or doing a relaxed purse lip breathing pattern.  - The less you use it, the better it will work when you need it. - Ok to use the inhaler up to 2 puffs  every 4 hours if you must but call for appointment if use goes up over your usual need - Don't leave home without it !!  (think of it like the spare tire for your car)    We will call to set you up for a PET scan and I will you with results   Please remember to go to the lab department   for your tests - we will call you with the results when they are available.

## 2019-02-09 NOTE — Progress Notes (Signed)
Spoke with pt and notified of results per Dr. Wert. Pt verbalized understanding and denied any questions. 

## 2019-02-09 NOTE — Progress Notes (Signed)
Subjective:     Patient ID: Luis Townsend, male   DOB: [birth year]   MRN: [id number]     Brief patient profile:  61 yowm MM quit smoking  02/2011 no problem with sob/cough and new cough/sob started around 2015 and worse since  fall 2017 referred to pulmonary clinic 05/30/2016 by Dorothyann Peng  With ? Copd proved to have GOLD II evolving to III COPD,   .History of Present Illness  05/30/2016 1st [facility] office visit/ [place]  On ACEi / symbicort  Chief Complaint  Patient presents with  . Pulmonary Consult    Referred by Dorothyann Peng. Pt c/o SOB for the past 2-3 yrs, worse since Jan 2018. He reports he gets SOB walking a few hundred ft, for example waslking to his mailbox. He does fine at rest. He has also had cough that comes and goes. Cough is occ prod with clear sputum. He finds it difficult to produce any sputum lately.   acutely ill in fall 2017 uri/pna and never completely recovered with persistent moderatedly severe daily cough min production and much worse doe = MMRC3 = can't walk 100 yards even at a slow pace at a flat grade s stopping due to sob   rec Plan A = Automatic =  Symbicort 160  Take 2 puffs first thing in am and then another 2 puffs about 12 hours later.  Work on inhaler technique:   Plan B = Backup Only use your albuterol (ventolin)  stop altace and start avapro 150 mg one daily - break in half if too strong     07/11/2016  f/u ov/Sergey Ishler re:  COPD  GOLD II / symb 160 2bid / off acei x 6 weeks  Chief Complaint  Patient presents with  . Follow-up    Breathing has improved and he is coughing less. He is using ventolin hfa 3-4 x per wk on average.     Less albuterol now, sleeping fine s am exac  Doe = MMRC2 = can't walk a nl pace on a flat grade s sob but does fine slow and flat  rec When you finish the present symbicort 160 change to 80 2 pffs every 12 hours  Work on inhaler technique:  Remember to pace yourself    09/13/2018  f/u ov/Tiah Heckel re: GOLD III on symbicort  160 2bid  Chief Complaint  Patient presents with  . Follow-up    Pt states his breathing is unchanged since last OV. Pt states he is using his rescue inhaler at least once daily. Pt denies cough, CP/tightness, f/c/s.   Dyspnea:  MMRC3= can't walk a slow pace on a flat grade s sob but does fine slow and flat would like to walk 2 blocks stopping  Cough: none SABA use: p ex never rechallenges 02: none  rec Trilegy one click each first thing x 2 good long drags to get all the medication  Only use your albuterol as a rescue medication   01/05/2019  ER eval hx acutely sob x 2 h and "chronic cough"  Rx   augmentin 875 bid x 10 days    01/19/2019  f/u ov/Atzin Buchta re: ? Lung mass sup segment LLL vs rounded pna  / GOLD III on trelegy  Chief Complaint  Patient presents with  . Follow-up    went to ED with weakness and SOB 01/05/19. He states that his breathing has improved since then.   dental implant summery 2020 for broken took  Dyspnea:  Improved but not back wallking 2 blocks / some fatigue over baseline  Cough: none - denies any sputum prod/ fever/ cp Sleeping: no resp symptoms SABA use: atrovent only  02: none  Appetite improving, no dysphagia, no recent dental symptoms, or loc/ asp events rec Sleep on on your tummy as much as you can  Augmentin 875 mg take one pill twice daily  X 20 days  Stop atrovent  Plan A = Automatic = Always=   Trelegy each am  Plan B = Backup (to supplement plan A, not to replace it) Only use your albuterol inhaler Please schedule a follow up office visit in 3 weeks, sooner if needed with cxr   02/09/2019  f/u ov/Lacharles Altschuler re: f/u LLL pna/ GOLD III copd back on trelegy Chief Complaint  Patient presents with  . Follow-up    Recent pneumonia  Dyspnea:  Room to room - w/c today Cough: very little  Sleeping: flat bed/ 1 pillow  SABA use: once a daily / not noct  02: none   No obvious day to day or daytime variability or assoc excess/ purulent sputum or mucus  plugs or hemoptysis or cp or chest tightness, subjective wheeze or overt sinus or hb symptoms.   sleeping as above without nocturnal  or early am exacerbation  of respiratory  c/o's or need for noct saba. Also denies any obvious fluctuation of symptoms with weather or environmental changes or other aggravating or alleviating factors except as outlined above   No unusual exposure hx or h/o childhood pna/ asthma or knowledge of premature birth.  Current Allergies, Complete Past Medical History, Past Surgical History, Family History, and Social History were reviewed in [employer] record.  ROS  The following are not active complaints unless bolded Hoarseness, sore throat, dysphagia, dental problems, itching, sneezing,  nasal congestion or discharge of excess mucus or purulent secretions, ear ache,   fever, chills, sweats, unintended wt loss or wt gain, classically pleuritic or exertional cp,  orthopnea pnd or arm/hand swelling  or leg swelling, presyncope, palpitations, abdominal pain, anorexia, nausea, vomiting, diarrhea  or change in bowel habits or change in bladder habits, change in stools or change in urine, dysuria, hematuria,  rash, arthralgias, visual complaints, headache, numbness, weakness or ataxia or problems with walking or coordination,  change in mood or  memory.        No outpatient medications have been marked as taking for the 02/09/19 encounter (Office Visit) with Tanda Rockers, MD.                       Objective:   Physical Exam    W/c bound elderly wm / very frail appearing but nad   01/19/2019       141  09/13/2018         145  10/02/2017       142 07/02/2017       146  05/26/2017       147   07/11/2016        142   05/30/16 143 lb (64.9 kg)  04/29/16 147 lb (66.7 kg)  04/15/16 148 lb (67.1 kg)     Vital signs reviewed - Note on arrival 02 sats  96% on RA       HEENT : pt wearing mask not removed for exam due to covid -19 concerns.     NECK :  without JVD/Nodes/TM/ nl carotid upstrokes bilaterally   LUNGS:  no acc muscle use,  Mod barrel  contour chest wall with bilateral  Distant bs  esp in L base s localized wheeze  and  without cough on insp or exp maneuvers and mod  Hyperresonant  to  Percussion x dullness at L base      CV:  RRR  no s3 or murmur or increase in P2, and  1+ R/ 2+ L LE pitting edema  ABD:  soft and nontender with pos mid insp Hoover's  in the supine position. No bruits or organomegaly appreciated, bowel sounds nl  MS:     ext warm without deformities, calf tenderness, cyanosis or clubbing No obvious joint restrictions   SKIN: warm and dry without lesions    NEURO:  alert, approp, nl sensorium with  no motor or cerebellar deficits apparent.          CXR PA and Lateral:   02/09/2019 :    I personally reviewed images and agree with radiology impression as follows:   Worsened left basilar airspace disease could be due to pneumonia or postobstructive pneumonitis.     Labs ordered/ reviewed:      Chemistry      Component Value Date/Time   NA 136 02/09/2019 1120   NA 141 07/10/2017 1048   K 4.2 02/09/2019 1120   CL 99 02/09/2019 1120   CO2 30 02/09/2019 1120   BUN 30 (H) 02/09/2019 1120   BUN 23 07/10/2017 1048   CREATININE 0.98 02/09/2019 1120   CREATININE 1.14 (H) 11/26/2015 1118      Component Value Date/Time   CALCIUM 8.5 02/09/2019 1120   ALKPHOS 125 (H) 01/17/2019 1129   AST 37 01/17/2019 1129   ALT 37 01/17/2019 1129   BILITOT 0.5 01/17/2019 1129   BILITOT 0.4 07/10/2017 1048        Lab Results  Component Value Date   WBC 10.8 (H) 02/09/2019   HGB 11.4 (L) 02/09/2019   HCT 34.1 (L) 02/09/2019   MCV 92.9 02/09/2019   PLT 253.0 02/09/2019             Lab Results  Component Value Date   ESRSEDRATE 24 (H) 02/09/2019                Assessment:

## 2019-02-10 ENCOUNTER — Encounter: Payer: Self-pay | Admitting: Internal Medicine

## 2019-02-10 ENCOUNTER — Telehealth: Payer: Self-pay | Admitting: *Deleted

## 2019-02-10 DIAGNOSIS — J90 Pleural effusion, not elsewhere classified: Secondary | ICD-10-CM | POA: Insufficient documentation

## 2019-02-10 NOTE — Assessment & Plan Note (Signed)
Onset of symptoms late Nov 2020 > rx augmentin 01/05/19 x 10 d - decreased as dz  on PA view 01/19/2019 > rec20 more days and f/u in 3 weeks off ICS if tolerates>  cxr worse 02/09/2019 ? L Effusion vs post obst atx on L  > see lung mass  No further abx, w/u for lung mass in progress now that easily treatable infection source addressed adequately.

## 2019-02-10 NOTE — Telephone Encounter (Signed)
Spoke with the pt's spouse and notified of recs per MW  She verbalized understanding  Nothing further needed

## 2019-02-10 NOTE — Telephone Encounter (Signed)
-----   Message from Tanda Rockers, MD sent at 02/10/2019  6:05 AM EST ----- Let pt know that p reviewing all the studies would like to go ahead and do L thoracentesis to relieve some of his sob and see if we can get an answer to the cause of the problem but go ahead with PET as well as that will give [place] more info as well   Already placed orders

## 2019-02-10 NOTE — Assessment & Plan Note (Signed)
L effusion by CT abd 02/01/2019 - L thoracentesis ordered   I had an extended discussion with the patient reviewing all relevant studies completed to date and  lasting 15 to 20 minutes of a 25 minute visit    Each maintenance medication was reviewed in detail including most importantly the difference between maintenance and prns and under what circumstances the prns are to be triggered using an action plan format that is not reflected in the computer generated alphabetically organized AVS.     Please see AVS for specific instructions unique to this visit that I personally wrote and verbalized to the the pt in detail and then reviewed with pt  by my nurse highlighting any  changes in therapy recommended at today's visit to their plan of care.

## 2019-02-10 NOTE — Assessment & Plan Note (Signed)
Quit smoking 2013 - PFT's  07/09/11  FEV1 1.5 (69 % ) ratio 51  p 21 % improvement from saba p ? prior to study with DLCO  64/65c % corrects to 74 % for alv volume   - 05/30/2016    try change from advair to symbicort 160 - Spirometry 05/08/2017  FEV1 0.73 (35%)  Ratio 47 ? p am symb 160 > added spiriva   - 05/26/2017  After extensive coaching inhaler device  effectiveness =    75% from a baseline of < 50%  - PFT's  07/02/2017  FEV1 0.87 (44 % ) ratio 40  p 21 % improvement from saba p 30/31 prior to study with DLCO  30/31 % corrects to 50 % for alv volume   - 07/02/2017    changed to stiolto 2 pffs each am > not covered - alpha one screening 07/02/2017 >>  MM  Level 167  - 10/02/2017  After extensive coaching inhaler device,  effectiveness =    90% from baseline 75% and doing fine on just symb 160 2bid so f/u prn - 09/13/2018  After extensive coaching inhaler device,  effectiveness =    90% with elipta so try trelegy one click each am   - 03/19/2534 rec change to anoro due to concerns re pna > changed back to trelegy 02/09/2019 as no evidence of ongoing infection    Group D in terms of symptom/risk and laba/lama/ICS  therefore appropriate rx at this point >>>  Continue trelegy   Pt informed of the seriousness of COVID 19 infection as a direct risk to lung health  and safey and to close contacts and should continue to wear a facemask in public and minimize exposure to public locations but especially avoid any area or activity where non-close contacts are not observing distancing or wearing an appropriate face mask.  I strongly recommended vaccine when offered.

## 2019-02-10 NOTE — Assessment & Plan Note (Signed)
Newly detect 01/05/2019 p presenting with worsening sob  - assoc L effusion by CT abd 02/01/2019 - L thoracentesis 02/10/2019 >>> see L effusion - PET 02/21/19 >>>  May be able to get dx from simple outpt L thoracentesis at this point so will do this first  Discussed in detail all the  indications, usual  risks and alternatives  relative to the benefits with patient who agrees to proceed with w/u as outlined.

## 2019-02-13 ENCOUNTER — Telehealth: Payer: Self-pay | Admitting: Pulmonary Disease

## 2019-02-13 ENCOUNTER — Encounter: Payer: Self-pay | Admitting: Adult Health

## 2019-02-13 NOTE — Telephone Encounter (Signed)
Spouse called the answering service for patient being short of breath  He was just recently in to see Dr. Melvyn Novas in the office Thoracentesis was scheduled for coming week  Lung mass with pleural effusion  He is more short of breath  Not getting enough relief with his inhaler medications   I did advise that the patient should be seen in the emergency room  Questions that they had include the reasons for his worsening-my discussion was along the lines of it could be worsening pleural effusion, could be more collapse in his lungs that was on the recent chest x-ray -This can only be further evaluated if patient is physically seen, with a repeat chest x-ray and then a decision can be made at that time  -Their concern is regarding patient risk with being in the hospital-this is understandable however, he still needs somebody to physically see him to be able to make a decision on how we can best help him to get more comfortable as he was recently physically seen in the office  My recommendation is still that patient needs to be further evaluated, not much can be added over the phone If they do not want to go to the hospital, they were advised to call the office tomorrow to see whether his thoracentesis can be moved forward

## 2019-02-14 ENCOUNTER — Ambulatory Visit: Payer: Medicare Other | Admitting: Physician Assistant

## 2019-02-14 ENCOUNTER — Other Ambulatory Visit (HOSPITAL_COMMUNITY)
Admission: RE | Admit: 2019-02-14 | Discharge: 2019-02-14 | Disposition: A | Discharge status: Home / Self Care | Payer: Medicare Other | Source: Ambulatory Visit | Attending: Internal Medicine | Admitting: Internal Medicine

## 2019-02-14 DIAGNOSIS — C3432 Malignant neoplasm of lower lobe, left bronchus or lung: Secondary | ICD-10-CM | POA: Diagnosis not present

## 2019-02-14 DIAGNOSIS — J90 Pleural effusion, not elsewhere classified: Secondary | ICD-10-CM | POA: Diagnosis not present

## 2019-02-14 DIAGNOSIS — R0602 Shortness of breath: Secondary | ICD-10-CM | POA: Diagnosis not present

## 2019-02-14 DIAGNOSIS — M7989 Other specified soft tissue disorders: Secondary | ICD-10-CM | POA: Diagnosis not present

## 2019-02-14 DIAGNOSIS — Z20822 Contact with and (suspected) exposure to covid-19: Secondary | ICD-10-CM | POA: Diagnosis not present

## 2019-02-15 ENCOUNTER — Encounter: Payer: Self-pay | Admitting: Adult Health

## 2019-02-15 ENCOUNTER — Other Ambulatory Visit: Payer: Self-pay

## 2019-02-15 ENCOUNTER — Telehealth: Payer: Self-pay | Admitting: Internal Medicine

## 2019-02-15 ENCOUNTER — Inpatient Hospital Stay (HOSPITAL_COMMUNITY)
Admission: EM | Admit: 2019-02-15 | Discharge: 2019-02-17 | DRG: 180 | Disposition: A | Discharge status: Home Health | Payer: Medicare Other | Attending: Internal Medicine | Admitting: Internal Medicine

## 2019-02-15 ENCOUNTER — Emergency Department (HOSPITAL_COMMUNITY): Payer: Medicare Other

## 2019-02-15 ENCOUNTER — Encounter (HOSPITAL_COMMUNITY): Payer: Self-pay

## 2019-02-15 DIAGNOSIS — Z79899 Other long term (current) drug therapy: Secondary | ICD-10-CM

## 2019-02-15 DIAGNOSIS — I252 Old myocardial infarction: Secondary | ICD-10-CM

## 2019-02-15 DIAGNOSIS — I2510 Atherosclerotic heart disease of native coronary artery without angina pectoris: Secondary | ICD-10-CM | POA: Diagnosis present

## 2019-02-15 DIAGNOSIS — J440 Chronic obstructive pulmonary disease with acute lower respiratory infection: Secondary | ICD-10-CM | POA: Diagnosis present

## 2019-02-15 DIAGNOSIS — J9601 Acute respiratory failure with hypoxia: Secondary | ICD-10-CM | POA: Diagnosis present

## 2019-02-15 DIAGNOSIS — Z8249 Family history of ischemic heart disease and other diseases of the circulatory system: Secondary | ICD-10-CM

## 2019-02-15 DIAGNOSIS — Z823 Family history of stroke: Secondary | ICD-10-CM

## 2019-02-15 DIAGNOSIS — Z7951 Long term (current) use of inhaled steroids: Secondary | ICD-10-CM

## 2019-02-15 DIAGNOSIS — J910 Malignant pleural effusion: Secondary | ICD-10-CM | POA: Diagnosis present

## 2019-02-15 DIAGNOSIS — M7989 Other specified soft tissue disorders: Secondary | ICD-10-CM | POA: Diagnosis not present

## 2019-02-15 DIAGNOSIS — I10 Essential (primary) hypertension: Secondary | ICD-10-CM | POA: Diagnosis present

## 2019-02-15 DIAGNOSIS — Z951 Presence of aortocoronary bypass graft: Secondary | ICD-10-CM

## 2019-02-15 DIAGNOSIS — D649 Anemia, unspecified: Secondary | ICD-10-CM | POA: Diagnosis present

## 2019-02-15 DIAGNOSIS — Z952 Presence of prosthetic heart valve: Secondary | ICD-10-CM

## 2019-02-15 DIAGNOSIS — R0602 Shortness of breath: Secondary | ICD-10-CM | POA: Diagnosis not present

## 2019-02-15 DIAGNOSIS — J90 Pleural effusion, not elsewhere classified: Secondary | ICD-10-CM | POA: Diagnosis present

## 2019-02-15 DIAGNOSIS — C3432 Malignant neoplasm of lower lobe, left bronchus or lung: Principal | ICD-10-CM | POA: Diagnosis present

## 2019-02-15 DIAGNOSIS — Z950 Presence of cardiac pacemaker: Secondary | ICD-10-CM

## 2019-02-15 DIAGNOSIS — I442 Atrioventricular block, complete: Secondary | ICD-10-CM | POA: Diagnosis present

## 2019-02-15 DIAGNOSIS — Z20822 Contact with and (suspected) exposure to covid-19: Secondary | ICD-10-CM | POA: Diagnosis not present

## 2019-02-15 DIAGNOSIS — J449 Chronic obstructive pulmonary disease, unspecified: Secondary | ICD-10-CM | POA: Diagnosis present

## 2019-02-15 DIAGNOSIS — J189 Pneumonia, unspecified organism: Secondary | ICD-10-CM | POA: Diagnosis present

## 2019-02-15 DIAGNOSIS — I5032 Chronic diastolic (congestive) heart failure: Secondary | ICD-10-CM | POA: Diagnosis present

## 2019-02-15 DIAGNOSIS — R918 Other nonspecific abnormal finding of lung field: Secondary | ICD-10-CM | POA: Diagnosis present

## 2019-02-15 DIAGNOSIS — I110 Hypertensive heart disease with heart failure: Secondary | ICD-10-CM | POA: Diagnosis present

## 2019-02-15 DIAGNOSIS — Z7982 Long term (current) use of aspirin: Secondary | ICD-10-CM

## 2019-02-15 LAB — NOVEL CORONAVIRUS, NAA (HOSP ORDER, SEND-OUT TO REF LAB; TAT 18-24 HRS): SARS-CoV-2, NAA: NOT DETECTED

## 2019-02-15 LAB — COMPREHENSIVE METABOLIC PANEL
ALT: 99 U/L — ABNORMAL HIGH (ref 0–44)
AST: 85 U/L — ABNORMAL HIGH (ref 15–41)
Albumin: 2.6 g/dL — ABNORMAL LOW (ref 3.5–5.0)
Alkaline Phosphatase: 153 U/L — ABNORMAL HIGH (ref 38–126)
Anion gap: 10 (ref 5–15)
BUN: 34 mg/dL — ABNORMAL HIGH (ref 8–23)
CO2: 26 mmol/L (ref 22–32)
Calcium: 8.8 mg/dL — ABNORMAL LOW (ref 8.9–10.3)
Chloride: 103 mmol/L (ref 98–111)
Creatinine, Ser: 1.08 mg/dL (ref 0.61–1.24)
GFR calc Af Amer: >60 mL/min (ref >60)
GFR calc non Af Amer: >60 mL/min (ref >60)
Glucose, Bld: 121 mg/dL — ABNORMAL HIGH (ref 70–99)
Potassium: 4.6 mmol/L (ref 3.5–5.1)
Sodium: 139 mmol/L (ref 135–145)
Total Bilirubin: 0.8 mg/dL (ref 0.3–1.2)
Total Protein: 6.8 g/dL (ref 6.5–8.1)

## 2019-02-15 LAB — CBC WITH DIFFERENTIAL/PLATELET
Abs Immature Granulocytes: 0.12 10*3/uL — ABNORMAL HIGH (ref 0.00–0.07)
Basophils Absolute: 0.1 10*3/uL (ref 0.0–0.1)
Basophils Relative: 0 %
Eosinophils Absolute: 0 10*3/uL (ref 0.0–0.5)
Eosinophils Relative: 0 %
HCT: 38.7 % — ABNORMAL LOW (ref 39.0–52.0)
Hemoglobin: 12.4 g/dL — ABNORMAL LOW (ref 13.0–17.0)
Immature Granulocytes: 1 %
Lymphocytes Relative: 8 %
Lymphs Abs: 1 10*3/uL (ref 0.7–4.0)
MCH: 31.1 pg (ref 26.0–34.0)
MCHC: 32 g/dL (ref 30.0–36.0)
MCV: 97 fL (ref 80.0–100.0)
Monocytes Absolute: 1.2 10*3/uL — ABNORMAL HIGH (ref 0.1–1.0)
Monocytes Relative: 9 %
Neutro Abs: 10.2 10*3/uL — ABNORMAL HIGH (ref 1.7–7.7)
Neutrophils Relative %: 82 %
Platelets: 238 10*3/uL (ref 150–400)
RBC: 3.99 MIL/uL — ABNORMAL LOW (ref 4.22–5.81)
RDW: 14.6 % (ref 11.5–15.5)
WBC: 12.6 10*3/uL — ABNORMAL HIGH (ref 4.0–10.5)
nRBC: 0 % (ref 0.0–0.2)

## 2019-02-15 LAB — PROTIME-INR
INR: 1 (ref 0.8–1.2)
Prothrombin Time: 13.3 seconds (ref 11.4–15.2)

## 2019-02-15 LAB — BRAIN NATRIURETIC PEPTIDE: B Natriuretic Peptide: 133 pg/mL — ABNORMAL HIGH (ref 0.0–100.0)

## 2019-02-15 LAB — TROPONIN I (HIGH SENSITIVITY)
Troponin I (High Sensitivity): 8 ng/L (ref <18)
Troponin I (High Sensitivity): 8 ng/L (ref <18)

## 2019-02-15 NOTE — Telephone Encounter (Signed)
I called and spoke with the patient and his wife. He reports that he has increased sob with only a "couple seconds to inhale and exhale". While he was talking to me I could hear he was having trouble finishing his statement without running out of breath. He reports a productive cough with little sputum(no color). He reports using his rescue inhaler every four hours. He denies any fever.

## 2019-02-15 NOTE — Telephone Encounter (Signed)
Spoke with patient's wife Let her know Dr. Gustavus Bryant recommendations. She voiced understanding and will go to [facility] ER. Nothing further needed at this time.

## 2019-02-15 NOTE — Telephone Encounter (Signed)
Most likely he can get some relief with the emergency removal of fluid from the L lung that I wanted done by now but was scheduled for 02/17/19 likely due to post holiday volumes so best to go on to [facility] er and see if that can be done emergently there (would require the ER doctor to see first to be sure that's the problem and consult our service prn)

## 2019-02-15 NOTE — ED Triage Notes (Signed)
Pt reports difficulty breathing that started a couple weeks ago. Pt has been seeing PCP and supposed to go see them again on Thursday but the [facility] has gotten worse. Pt having to gasp for breath between words.

## 2019-02-15 NOTE — ED Provider Notes (Signed)
[facility] DEPT Provider Note   CSN: [id number] Arrival date & time: 02/15/19  1449     History Chief Complaint  Patient presents with  . Shortness of Breath    Luis Townsend is a 86 y.o. male.  The history is provided by the patient and medical records. No language interpreter was used.  Shortness of Breath Severity:  Severe Onset quality:  Gradual Timing:  Constant Progression:  Worsening Chronicity:  New Relieved by:  Oxygen Worsened by:  Nothing Ineffective treatments:  None tried Associated symptoms: no abdominal pain, no chest pain, no cough, no diaphoresis, no fever, no headaches, no neck pain, no sputum production, no vomiting and no wheezing        Past Medical History:  Diagnosis Date  . Amaurosis fugax of left eye 10/11/1996  . Aortic stenosis 06/30/2011   severe  . Aortoiliac occlusive disease ([place]) 06/30/2011  . Cardiac resynchronization therapy pacemaker (CRT-P) in place    a. 11/29/15:  Medtronic model X8PJ82 Percepta Quad CRT-P MRI SureScan (serial Number [id number] )  . Cerebrovascular disease 06/30/2011  . CHB (complete heart block) ([place]) 02/03/2013  . COPD (chronic obstructive pulmonary disease), moderat to severe 07/29/2011  . Coronary artery disease    a. 10/29/15 Cath for decreased EF with patent 2/3 grafts, no change   . Hyperlipidemia   . Hypertension   . Myocardial infarction ([place]) 03/22/1994   acute LAD occlusion  . PAD (peripheral artery disease) ([place])   . RBBB (right bundle branch block)   . S/P CABG x 5 03/28/1994   LIMA to LAD, sequential SVG to OM1-OM2-OM4, SVG to RCA, open vein harvest right leg - Dr Redmond Pulling  . S/P carotid endarterectomy 10/28/1996   Dr Donnetta Hutching  . Subclavian artery stenosis, left ([place]) 06/30/2011  . Tobacco abuse     Patient Active Problem List   Diagnosis Date Noted  . Pleural effusion on left 02/10/2019  . Mass of lower lobe of left lung 02/09/2019  . CAP (community acquired pneumonia)  01/19/2019  . Dyspnea on exertion 05/30/2016  . Status post biventricular pacemaker 12/05/2015  . Cardiac resynchronization therapy pacemaker (CRT-P) in place   . Nonischemic cardiomyopathy ([place])   . Bleeding at insertion site 10/29/2015  . Left ventricular systolic dysfunction   . PAD (peripheral artery disease) ([place])   . Chronic systolic CHF (congestive heart failure) ([place]) 10/11/2015  . Pre-op testing 10/11/2015  . Clotting disorder ([place]) 10/11/2015  . SSS (sick sinus syndrome) ([place]) 08/30/2015  . Carotid stenosis 08/30/2015  . CHB (complete heart block) ([place]) 02/03/2013  . Pacemaker - Medtronic CRT-P upgrade 2017 02/03/2013  . 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 Aortic stenosis, severe 09/29/2011  . Hyperlipidemia 08/27/2011  . Aortic valve stenosis, severe 08/27/2011  . CAD (coronary artery disease) 08/27/2011  . HTN (hypertension) 08/27/2011  . PVD (peripheral vascular disease) ([place]) 08/27/2011  . COPD GOLD II vs III 07/29/2011  . Bradycardia 07/28/2011  . S/P aortic valve replacement 07/21/2011  . Aortic stenosis, critical AS with TAVR 11/2011 06/30/2011  . Cerebrovascular disease 06/30/2011  . Subclavian artery stenosis, left, pta and stent 07/29/11 06/30/2011  . Aortoiliac occlusive disease ([place]) 06/30/2011  . Essential hypertension   . S/P carotid endarterectomy 10/28/1996  . S/P CABG x 5, stenosis to RCA VG 1995 03/28/1994    Past Surgical History:  Procedure Laterality Date  . BILATERAL UPPER EXTREMITY ANGIOGRAM N/A 07/29/2011   Procedure: BILATERAL UPPER EXTREMITY ANGIOGRAM;  Surgeon: Lorretta Harp, MD;  Location: [facility] CATH LAB;  Service: Cardiovascular;  Laterality: N/A;  . CARDIAC CATHETERIZATION  06/24/2011   No intervention - Recommendation-angioplasty/stenting of Lft subclavian artery, high risk replacement of aortic valve w/ biological prosthesis and redo CABG w/ preservation of LIMA bypass and new SVG to PDA and OM, reevaluate  carotid arteries and pulmonary function prior to surgery.  Marland Kitchen CARDIAC CATHETERIZATION N/A 10/29/2015   Procedure: Coronary/Graft Angiography;  Surgeon: Lorretta Harp, MD;  Location: [place] [facility] CV LAB;  Service: Cardiovascular;  Laterality: N/A;  . CARDIAC VALVE REPLACEMENT    . CARDIOVASCULAR STRESS TEST  07/10/2009   Evidence of mild ischemia in Basal Anteriolateral and Mid Anterolateral regions-additional diaphragmatic attenuation. No ECG changes. Low risk.\  . CAROTID DOPPLER  10/18/2012   Rt bulb/prox ICA demonstrated 50-69% diameter reduction, Lft mid ICA demonstrated 50-69% diameter reduction, Lft subclavian- = to or <50% sig diameter reduction, Lft vertebral-appeared occluded  . CAROTID ENDARTERECTOMY  10/28/96  DR. TODD EARLY   LEFT AND DACRON PATCH ANGIOPLASTY  . CATARACT EXTRACTION Right 2017  . CORONARY ARTERY BYPASS GRAFT  03/28/94 DR. CHARLES WILSON   x5(LIMA to LAD,SVG to OM1-OM2-OM4, SVG to RCA  . EP IMPLANTABLE DEVICE N/A 11/29/2015   Procedure: BiV Upgrade;  Surgeon: Will Meredith Leeds, MD;  Location: [place] CV LAB;  Service: Cardiovascular;  Laterality: N/A;  . EYE SURGERY    . LEFT HEART CATHETERIZATION WITH CORONARY/GRAFT ANGIOGRAM N/A 06/24/2011   Procedure: LEFT HEART CATHETERIZATION WITH Beatrix Fetters;  Surgeon: Sanda Klein, MD;  Location: [facility] CATH LAB;  Service: Cardiovascular;  Laterality: N/A;  . PACEMAKER INSERTION  11/12/2011   Medtronic Adapta; model# ADDR01, serial# [id number]  . PERIPHERAL VASCULAR ANGIOPLASTY  07/29/2011   Lft Subclavian 90% stenosis, stented w/ a 7x8mm long Cordis Genesis resulting in reduction of 90% stenosos to 0% resdiual.  . TRANSTHORACIC ECHOCARDIOGRAM  08/05/2012   EF 50-55%, septial motion showed abnormal function and dyssynergy, LA mild-moderately dilated       Family History  Problem Relation Age of Onset  . Stroke Mother 21       died of stroke  . Heart attack Father 73       did not drink  . Heart disease  Father   . Other Father        polio  . Pneumonia Maternal Grandmother   . Diabetes Maternal Grandfather   . Heart attack Paternal Grandmother     Social History   Tobacco Use  . Smoking status: Former Smoker    Packs/day: 1.00    Years: 60.00    Pack years: 60.00    Types: Cigarettes    Quit date: 03/02/2011    Years since quitting: 7.9  . Smokeless tobacco: Never Used  Substance Use Topics  . Alcohol use: No    Alcohol/week: 0.0 standard drinks  . Drug use: No    Home Medications Prior to Admission medications   Medication Sig Start Date End Date Taking? Authorizing Provider  albuterol (VENTOLIN HFA) 108 (90 Base) MCG/ACT inhaler INHALE 2 PUFFS BY MOUTH EVERY 4 HOURS AS NEEDED 12/20/18   Tanda Rockers, MD  aspirin EC 81 MG tablet Take 81 mg by mouth daily.    [provider]  carvedilol (COREG) 12.5 MG tablet Take 1 tablet (12.5 mg total) by mouth 2 (two) times daily. 10/19/18 01/17/19  Camnitz, Ocie Doyne, MD  ezetimibe (ZETIA) 10 MG tablet TAKE 1 TABLET BY MOUTH EVERY DAY  Patient taking differently: Take 10 mg by mouth daily.  12/07/18   Croitoru, Mihai, MD  Fluticasone-Umeclidin-Vilant (TRELEGY ELLIPTA) 100-62.5-25 MCG/INH AEPB Inhale 1 puff into the lungs daily. 02/09/19   Tanda Rockers, MD  furosemide (LASIX) 20 MG tablet TAKE 1 TABLET BY MOUTH EVERY DAY Patient taking differently: Take 20 mg by mouth daily.  12/07/18   Croitoru, Mihai, MD  naproxen (NAPROSYN) 500 MG tablet Take 1 tablet (500 mg total) by mouth 2 (two) times daily with a meal. 02/01/19   Nafziger, [name], NP  rosuvastatin (CRESTOR) 40 MG tablet TAKE 1 TABLET BY MOUTH EVERY DAY Patient taking differently: Take 40 mg by mouth at bedtime.  09/01/18   Camnitz, Ocie Doyne, MD    Allergies    Patient has no known allergies.  Review of Systems   Review of Systems  Constitutional: Positive for fatigue. Negative for chills, diaphoresis and fever.  HENT: Negative for congestion.   Respiratory:  Positive for shortness of breath. Negative for cough, sputum production, chest tightness, wheezing and stridor.   Cardiovascular: Positive for leg swelling. Negative for chest pain and palpitations.  Gastrointestinal: Negative for abdominal pain, constipation, diarrhea, nausea and vomiting.  Genitourinary: Negative for decreased urine volume, dysuria, flank pain and frequency.  Musculoskeletal: Negative for back pain, neck pain and neck stiffness.  Neurological: Negative for seizures, weakness, light-headedness, numbness and headaches.  Psychiatric/Behavioral: Negative for agitation.  All other systems reviewed and are negative.   Physical Exam Updated Vital Signs BP (!) 164/53   Pulse 71   Temp 97.8 F (36.6 C) (Oral)   Resp (!) 26   Ht 5' 5.5" (1.664 m)   Wt 61.9 kg   SpO2 97%   BMI 22.36 kg/m   Physical Exam Vitals and nursing note reviewed.  Constitutional:      General: He is not in acute distress.    Appearance: He is well-developed. He is not ill-appearing, toxic-appearing or diaphoretic.  HENT:     Head: Normocephalic and atraumatic.  Eyes:     Conjunctiva/sclera: Conjunctivae normal.     Pupils: Pupils are equal, round, and reactive to light.  Cardiovascular:     Rate and Rhythm: Normal rate and regular rhythm.     Heart sounds: Murmur present.  Pulmonary:     Effort: Tachypnea present. No respiratory distress.     Breath sounds: Examination of the left-middle field reveals decreased breath sounds. Examination of the left-lower field reveals decreased breath sounds. Decreased breath sounds and rales present.  Chest:     Chest wall: No tenderness.  Abdominal:     Palpations: Abdomen is soft.     Tenderness: There is no abdominal tenderness.  Musculoskeletal:     Cervical back: Neck supple.     Right lower leg: No tenderness. Edema present.     Left lower leg: No tenderness. Edema present.  Skin:    General: Skin is warm and dry.     Capillary Refill:  Capillary refill takes less than 2 seconds.  Neurological:     General: No focal deficit present.     Mental Status: He is alert.  Psychiatric:        Mood and Affect: Mood normal.     ED Results / Procedures / Treatments   Labs (all labs ordered are listed, but only abnormal results are displayed) Labs Reviewed  CBC WITH DIFFERENTIAL/PLATELET - Abnormal; Notable for the following components:      Result Value   WBC 12.6 (*)  RBC 3.99 (*)    Hemoglobin 12.4 (*)    HCT 38.7 (*)    Neutro Abs 10.2 (*)    Monocytes Absolute 1.2 (*)    Abs Immature Granulocytes 0.12 (*)    All other components within normal limits  COMPREHENSIVE METABOLIC PANEL - Abnormal; Notable for the following components:   Glucose, Bld 121 (*)    BUN 34 (*)    Calcium 8.8 (*)    Albumin 2.6 (*)    AST 85 (*)    ALT 99 (*)    Alkaline Phosphatase 153 (*)    All other components within normal limits  BRAIN NATRIURETIC PEPTIDE - Abnormal; Notable for the following components:   B Natriuretic Peptide 133.0 (*)    All other components within normal limits  SARS CORONAVIRUS 2 (TAT 6-24 HRS)  PROTIME-INR  TROPONIN I (HIGH SENSITIVITY)  TROPONIN I (HIGH SENSITIVITY)    EKG EKG Interpretation  Date/Time:  Tuesday February 15 2019 15:26:49 EST Ventricular Rate:  73 PR Interval:    QRS Duration: 69 QT Interval:  569 QTC Calculation: 628 R Axis:   -66 Text Interpretation: Paced Left axis deviation Repol abnrm suggests ischemia, diffuse leads Prolonged QT interval When compared to prior, similar large paced rhythm.  wandering baseline. No STEMI Confirmed by Antony Blackbird [phone]) on 02/15/2019 6:56:29 PM   Radiology DG Chest 2 View  Result Date: 02/15/2019 CLINICAL DATA:  Shortness of breath. EXAM: CHEST - 2 VIEW COMPARISON:  02/09/2019. FINDINGS: AICD noted in stable position. Prior cardiac valve replacement. Heart size normal. Left mid lung field infiltrate and left-sided pleural effusion again noted.  Interim slight increase in left-sided pleural effusion from prior exam. No pneumothorax. Surgical clips left neck. Degenerative changes thoracic spine multiple stable thoracic vertebral body compression fractures. 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 IMPRESSION: 1. AICD noted stable position. Prior cardiac valve replacement. Heart size normal. 2. Prominent left mid lung field infiltrate and moderate left-sided pleural effusion again noted. Slight increase in left pleural effusion noted from prior exam. Electronically Signed   By: Marcello Moores  Register   On: 02/15/2019 15:49    Procedures Procedures (including critical care time)  Medications Ordered in ED Medications - No data to display  ED Course  I have reviewed the triage vital signs and the nursing notes.  Pertinent labs & imaging results that were available during my care of the patient were reviewed by me and considered in my medical decision making (see chart for details).    MDM Rules/Calculators/A&P                      Luis Townsend is a 86 y.o. male with a past medical history significant for CAD status post CABG, carotid disease status post endarterectomy, aortic valve stenosis status post TAVR, COPD, complete heart block and sick sinus syndrome status post pacemaker, CHF, hypertension, hyperlipidemia, and recent discovery of lung mass with associated pleural effusion who presents at the direction of his pulmonologist for evaluation of worsening shortness of breath.  Patient reports that for the last few weeks he has had worsening shortness of breath and his worsened acutely over the last few days.  He says that normally he is able to ambulate but has been very short of breath with any movement.  He also is very short of breath and cannot finish sentences.  He is gasping for air between words.  He denies any chest pain or chest pressure.  He denies any nausea, vomiting, fevers, or chills.  He had a negative PCR Covid test result  yesterday.  He was scheduled to have a thoracentesis done later this week for his large pleural effusion discovered on imaging but due to his shortness of breath was told to come in for evaluation.  He reports his legs have been more swollen over the last week.  He denies any new leg pains.  Denies history of DVT or PE.         On exam, patient has decreased breath sounds on the left side.  Mild rhonchi.  Some rales.  No wheezing.  Chest and abdomen nontender.  Back and flanks nontender.  Patient is on 2 L.  Patient was reportedly satting 90% with tachypnea and respiratory distress in triage prompting oxygen to be initiated.  He reports he is feeling somewhat better and is now not tachypneic and is having oxygen saturations in the upper 90s on 2 L.  He does not take oxygen at home.  Clinically I am concerned that patient's pleural effusion has now worsened to the point where he is having hypoxia and respiratory distress.  With his new peripheral edema, will get BNP and screening labs.  Will get chest x-ray.  EKG shows paced rhythm with no STEMI.  He is not having any chest pain or any Covid symptoms at this time.  After work-up is completed, anticipate calling the pulmonary team per their plan for further management.  As his Covid test was negative yesterday, with lack of new symptoms, do not feel he needs to be tested again today.  Chest x-ray was performed showing worsened left pleural effusion and consolidation.  He is afebrile and not tachycardic.  Doubt acute pneumonia at this time.  When labs are back, will call pulmonary team.    9:16 PM Labs have again returned.  BNP is slightly elevated at 133.  Mild leukocytosis and mild anemia.  Creatinine not elevated.  Initial troponin negative.   For the plan, pulmonary team will be called.  Anticipate admission for further management.  9:28 PM Just spoke with pulmonary critical care team who requested patient be admitted to medicine service and  they will see him either overnight or in the morning to discuss thoracentesis planning.  Hospitalist team called for admission.   Final Clinical Impression(s) / ED Diagnoses Final diagnoses:  SOB (shortness of breath)  Pleural effusion    Rx / DC Orders ED Discharge Orders    None     Clinical Impression: 1. SOB (shortness of breath)   2. Pleural effusion     Disposition: Admit  This note was prepared with assistance of Dragon voice recognition software. Occasional wrong-word or sound-a-like substitutions may have occurred due to the inherent limitations of voice recognition software.     Abbeygail Igoe, Gwenyth Allegra, MD 02/15/19 [phone]

## 2019-02-16 ENCOUNTER — Inpatient Hospital Stay (HOSPITAL_COMMUNITY): Payer: Medicare Other

## 2019-02-16 ENCOUNTER — Other Ambulatory Visit (HOSPITAL_COMMUNITY): Payer: Medicare Other

## 2019-02-16 ENCOUNTER — Encounter (HOSPITAL_COMMUNITY): Payer: Self-pay | Admitting: Internal Medicine

## 2019-02-16 DIAGNOSIS — R918 Other nonspecific abnormal finding of lung field: Secondary | ICD-10-CM

## 2019-02-16 DIAGNOSIS — I2510 Atherosclerotic heart disease of native coronary artery without angina pectoris: Secondary | ICD-10-CM | POA: Diagnosis present

## 2019-02-16 DIAGNOSIS — Z7982 Long term (current) use of aspirin: Secondary | ICD-10-CM | POA: Diagnosis not present

## 2019-02-16 DIAGNOSIS — R9431 Abnormal electrocardiogram [ECG] [EKG]: Secondary | ICD-10-CM | POA: Diagnosis not present

## 2019-02-16 DIAGNOSIS — J189 Pneumonia, unspecified organism: Secondary | ICD-10-CM | POA: Diagnosis present

## 2019-02-16 DIAGNOSIS — J440 Chronic obstructive pulmonary disease with acute lower respiratory infection: Secondary | ICD-10-CM | POA: Diagnosis present

## 2019-02-16 DIAGNOSIS — D649 Anemia, unspecified: Secondary | ICD-10-CM | POA: Diagnosis present

## 2019-02-16 DIAGNOSIS — J9601 Acute respiratory failure with hypoxia: Secondary | ICD-10-CM | POA: Diagnosis not present

## 2019-02-16 DIAGNOSIS — J90 Pleural effusion, not elsewhere classified: Secondary | ICD-10-CM | POA: Diagnosis not present

## 2019-02-16 DIAGNOSIS — C3432 Malignant neoplasm of lower lobe, left bronchus or lung: Secondary | ICD-10-CM | POA: Diagnosis present

## 2019-02-16 DIAGNOSIS — Z8249 Family history of ischemic heart disease and other diseases of the circulatory system: Secondary | ICD-10-CM | POA: Diagnosis not present

## 2019-02-16 DIAGNOSIS — Z823 Family history of stroke: Secondary | ICD-10-CM | POA: Diagnosis not present

## 2019-02-16 DIAGNOSIS — Z79899 Other long term (current) drug therapy: Secondary | ICD-10-CM | POA: Diagnosis not present

## 2019-02-16 DIAGNOSIS — C384 Malignant neoplasm of pleura: Secondary | ICD-10-CM | POA: Diagnosis not present

## 2019-02-16 DIAGNOSIS — I442 Atrioventricular block, complete: Secondary | ICD-10-CM | POA: Diagnosis present

## 2019-02-16 DIAGNOSIS — Z950 Presence of cardiac pacemaker: Secondary | ICD-10-CM | POA: Diagnosis not present

## 2019-02-16 DIAGNOSIS — I5032 Chronic diastolic (congestive) heart failure: Secondary | ICD-10-CM | POA: Diagnosis present

## 2019-02-16 DIAGNOSIS — R0602 Shortness of breath: Secondary | ICD-10-CM | POA: Diagnosis present

## 2019-02-16 DIAGNOSIS — J449 Chronic obstructive pulmonary disease, unspecified: Secondary | ICD-10-CM

## 2019-02-16 DIAGNOSIS — J910 Malignant pleural effusion: Secondary | ICD-10-CM | POA: Diagnosis present

## 2019-02-16 DIAGNOSIS — Z20822 Contact with and (suspected) exposure to covid-19: Secondary | ICD-10-CM | POA: Diagnosis present

## 2019-02-16 DIAGNOSIS — Z7951 Long term (current) use of inhaled steroids: Secondary | ICD-10-CM | POA: Diagnosis not present

## 2019-02-16 DIAGNOSIS — Z952 Presence of prosthetic heart valve: Secondary | ICD-10-CM | POA: Diagnosis not present

## 2019-02-16 DIAGNOSIS — I252 Old myocardial infarction: Secondary | ICD-10-CM | POA: Diagnosis not present

## 2019-02-16 DIAGNOSIS — I110 Hypertensive heart disease with heart failure: Secondary | ICD-10-CM | POA: Diagnosis present

## 2019-02-16 DIAGNOSIS — Z951 Presence of aortocoronary bypass graft: Secondary | ICD-10-CM | POA: Diagnosis not present

## 2019-02-16 HISTORY — PX: IR THORACENTESIS ASP PLEURAL SPACE W/IMG GUIDE: IMG5380

## 2019-02-16 LAB — CBC
HCT: 36.4 % — ABNORMAL LOW (ref 39.0–52.0)
Hemoglobin: 11.7 g/dL — ABNORMAL LOW (ref 13.0–17.0)
MCH: 30.7 pg (ref 26.0–34.0)
MCHC: 32.1 g/dL (ref 30.0–36.0)
MCV: 95.5 fL (ref 80.0–100.0)
Platelets: 227 10*3/uL (ref 150–400)
RBC: 3.81 MIL/uL — ABNORMAL LOW (ref 4.22–5.81)
RDW: 14.7 % (ref 11.5–15.5)
WBC: 12.5 10*3/uL — ABNORMAL HIGH (ref 4.0–10.5)
nRBC: 0 % (ref 0.0–0.2)

## 2019-02-16 LAB — BASIC METABOLIC PANEL
Anion gap: 11 (ref 5–15)
BUN: 36 mg/dL — ABNORMAL HIGH (ref 8–23)
CO2: 25 mmol/L (ref 22–32)
Calcium: 8.4 mg/dL — ABNORMAL LOW (ref 8.9–10.3)
Chloride: 104 mmol/L (ref 98–111)
Creatinine, Ser: 0.96 mg/dL (ref 0.61–1.24)
GFR calc Af Amer: >60 mL/min (ref >60)
GFR calc non Af Amer: >60 mL/min (ref >60)
Glucose, Bld: 94 mg/dL (ref 70–99)
Potassium: 4.7 mmol/L (ref 3.5–5.1)
Sodium: 140 mmol/L (ref 135–145)

## 2019-02-16 LAB — GLUCOSE, PLEURAL OR PERITONEAL FLUID: Glucose, Fluid: 40 mg/dL

## 2019-02-16 LAB — BODY FLUID CELL COUNT WITH DIFFERENTIAL
Eos, Fluid: 1 %
Lymphs, Fluid: 23 %
Monocyte-Macrophage-Serous Fluid: 4 % — ABNORMAL LOW (ref 50–90)
Neutrophil Count, Fluid: 72 % — ABNORMAL HIGH (ref 0–25)
Total Nucleated Cell Count, Fluid: 2216 cu mm — ABNORMAL HIGH (ref 0–1000)

## 2019-02-16 LAB — LACTATE DEHYDROGENASE: LDH: 261 U/L — ABNORMAL HIGH (ref 98–192)

## 2019-02-16 LAB — ALBUMIN, PLEURAL OR PERITONEAL FLUID: Albumin, Fluid: 1.7 g/dL

## 2019-02-16 LAB — PROTEIN, PLEURAL OR PERITONEAL FLUID: Total protein, fluid: 3.5 g/dL

## 2019-02-16 LAB — LACTATE DEHYDROGENASE, PLEURAL OR PERITONEAL FLUID: LD, Fluid: 970 U/L — ABNORMAL HIGH (ref 3–23)

## 2019-02-16 LAB — SARS CORONAVIRUS 2 (TAT 6-24 HRS): SARS Coronavirus 2: NEGATIVE

## 2019-02-16 MED ORDER — ALBUTEROL SULFATE HFA 108 (90 BASE) MCG/ACT IN AERS
2.0000 | INHALATION_SPRAY | Freq: Three times a day (TID) | RESPIRATORY_TRACT | Status: DC
  Administered 2019-02-16 – 2019-02-17 (×4): 2 via RESPIRATORY_TRACT
  Filled 2019-02-16: qty 6.7

## 2019-02-16 MED ORDER — ALBUTEROL SULFATE HFA 108 (90 BASE) MCG/ACT IN AERS: 2.0000 | INHALATION_SPRAY | RESPIRATORY_TRACT | Status: DC | PRN

## 2019-02-16 MED ORDER — ORAL CARE MOUTH RINSE
15.0000 mL | Freq: Two times a day (BID) | OROMUCOSAL | Status: DC
  Administered 2019-02-16 (×2): 15 mL via OROMUCOSAL

## 2019-02-16 MED ORDER — UMECLIDINIUM BROMIDE 62.5 MCG/INH IN AEPB
1.0000 | INHALATION_SPRAY | Freq: Every day | RESPIRATORY_TRACT | Status: DC
  Administered 2019-02-16 – 2019-02-17 (×2): 1 via RESPIRATORY_TRACT
  Filled 2019-02-16: qty 7

## 2019-02-16 MED ORDER — ONDANSETRON HCL 4 MG PO TABS: 4.0000 mg | ORAL_TABLET | Freq: Four times a day (QID) | ORAL | Status: DC | PRN

## 2019-02-16 MED ORDER — ONDANSETRON HCL 4 MG/2ML IJ SOLN: 4.0000 mg | Freq: Four times a day (QID) | INTRAMUSCULAR | Status: DC | PRN

## 2019-02-16 MED ORDER — FLUTICASONE FUROATE-VILANTEROL 100-25 MCG/INH IN AEPB
1.0000 | INHALATION_SPRAY | Freq: Every day | RESPIRATORY_TRACT | Status: DC
  Administered 2019-02-16 – 2019-02-17 (×2): 1 via RESPIRATORY_TRACT
  Filled 2019-02-16: qty 28

## 2019-02-16 MED ORDER — FLUTICASONE-UMECLIDIN-VILANT 100-62.5-25 MCG/INH IN AEPB: 1.0000 | INHALATION_SPRAY | Freq: Every day | RESPIRATORY_TRACT | Status: DC

## 2019-02-16 MED ORDER — ACETAMINOPHEN 325 MG PO TABS: 650.0000 mg | ORAL_TABLET | Freq: Four times a day (QID) | ORAL | Status: DC | PRN

## 2019-02-16 MED ORDER — ALBUTEROL SULFATE (2.5 MG/3ML) 0.083% IN NEBU
3.0000 mL | INHALATION_SOLUTION | RESPIRATORY_TRACT | Status: DC | PRN
  Administered 2019-02-16: 3 mL via RESPIRATORY_TRACT
  Filled 2019-02-16: qty 3

## 2019-02-16 MED ORDER — CARVEDILOL 12.5 MG PO TABS
12.5000 mg | ORAL_TABLET | Freq: Two times a day (BID) | ORAL | Status: DC
  Administered 2019-02-16 – 2019-02-17 (×3): 12.5 mg via ORAL
  Filled 2019-02-16 (×3): qty 1

## 2019-02-16 MED ORDER — ACETAMINOPHEN 650 MG RE SUPP: 650.0000 mg | Freq: Four times a day (QID) | RECTAL | Status: DC | PRN

## 2019-02-16 MED ORDER — ALBUTEROL SULFATE HFA 108 (90 BASE) MCG/ACT IN AERS
2.0000 | INHALATION_SPRAY | Freq: Four times a day (QID) | RESPIRATORY_TRACT | Status: DC
  Administered 2019-02-16: 2 via RESPIRATORY_TRACT
  Filled 2019-02-16: qty 6.7

## 2019-02-16 MED ORDER — EZETIMIBE 10 MG PO TABS
10.0000 mg | ORAL_TABLET | Freq: Every day | ORAL | Status: DC
  Administered 2019-02-16 – 2019-02-17 (×2): 10 mg via ORAL
  Filled 2019-02-16 (×2): qty 1

## 2019-02-16 MED ORDER — AZITHROMYCIN 250 MG PO TABS
250.0000 mg | ORAL_TABLET | Freq: Every day | ORAL | Status: DC
  Administered 2019-02-17: 250 mg via ORAL
  Filled 2019-02-16: qty 1

## 2019-02-16 MED ORDER — LIDOCAINE HCL 1 % IJ SOLN
INTRAMUSCULAR | Status: DC | PRN
  Administered 2019-02-16: 5 mL

## 2019-02-16 MED ORDER — FUROSEMIDE 20 MG PO TABS
20.0000 mg | ORAL_TABLET | Freq: Every day | ORAL | Status: DC
  Administered 2019-02-16 – 2019-02-17 (×2): 20 mg via ORAL
  Filled 2019-02-16 (×2): qty 1

## 2019-02-16 MED ORDER — SODIUM CHLORIDE 0.9 % IV SOLN
1.0000 g | INTRAVENOUS | Status: DC
  Administered 2019-02-16: 1 g via INTRAVENOUS
  Filled 2019-02-16: qty 1
  Filled 2019-02-16: qty 10

## 2019-02-16 MED ORDER — AZITHROMYCIN 250 MG PO TABS
500.0000 mg | ORAL_TABLET | Freq: Once | ORAL | Status: AC
  Administered 2019-02-16: 500 mg via ORAL
  Filled 2019-02-16: qty 2

## 2019-02-16 MED ORDER — ROSUVASTATIN CALCIUM 20 MG PO TABS
40.0000 mg | ORAL_TABLET | Freq: Every day | ORAL | Status: DC
  Administered 2019-02-16: 40 mg via ORAL
  Filled 2019-02-16: qty 2

## 2019-02-16 NOTE — ED Notes (Signed)
ED TO INPATIENT HANDOFF REPORT  ED Nurse Name and Phone #: Doylene Canning  S Name/Age/Gender Luis Townsend 86 y.o. male Room/Bed: WA10/WA10  Code Status   Code Status: Prior  Home/SNF/Other Home Patient oriented to: self, place, time and situation Is this baseline? Yes   Triage Complete: Triage complete  Chief Complaint Acute respiratory failure with hypoxia ([place]) [J96.01]  Triage Note Pt reports difficulty breathing that started a couple weeks ago. Pt has been seeing PCP and supposed to go see them again on Thursday but the [facility] has gotten worse. Pt having to gasp for breath between words.     Allergies No Known Allergies  Level of Care/Admitting Diagnosis ED Disposition    ED Disposition Condition Comment   Admit  Hospital Area: [facility] [[id number]]  Level of Care: Telemetry [5]  Admit to tele based on following criteria: Monitor for Ischemic changes  Covid Evaluation: Asymptomatic Screening Protocol (No Symptoms)  Diagnosis: Acute respiratory failure with hypoxia [facility]) [[id number]]  Admitting Physician: Rise Patience [phone]  Attending Physician: Rise Patience [email or web address].Right       B Medical/Surgery History Past Medical History:  Diagnosis Date  . Amaurosis fugax of left eye 10/11/1996  . Aortic stenosis 06/30/2011   severe  . Aortoiliac occlusive disease ([place]) 06/30/2011  . Cardiac resynchronization therapy pacemaker (CRT-P) in place    a. 11/29/15:  Medtronic model W9NL89 Percepta Quad CRT-P MRI SureScan (serial Number [id number] )  . Cerebrovascular disease 06/30/2011  . CHB (complete heart block) ([place]) 02/03/2013  . COPD (chronic obstructive pulmonary disease), moderat to severe 07/29/2011  . Coronary artery disease    a. 10/29/15 Cath for decreased EF with patent 2/3 grafts, no change   . Hyperlipidemia   . Hypertension   . Myocardial infarction ([place]) 03/22/1994   acute LAD occlusion  . PAD (peripheral artery disease) ([place])   . RBBB  (right bundle branch block)   . S/P CABG x 5 03/28/1994   LIMA to LAD, sequential SVG to OM1-OM2-OM4, SVG to RCA, open vein harvest right leg - Dr Redmond Pulling  . S/P carotid endarterectomy 10/28/1996   Dr Donnetta Hutching  . Subclavian artery stenosis, left ([place]) 06/30/2011  . Tobacco abuse    Past Surgical History:  Procedure Laterality Date  . BILATERAL UPPER EXTREMITY ANGIOGRAM N/A 07/29/2011   Procedure: BILATERAL UPPER EXTREMITY ANGIOGRAM;  Surgeon: Lorretta Harp, MD;  Location: [facility] CATH LAB;  Service: Cardiovascular;  Laterality: N/A;  . CARDIAC CATHETERIZATION  06/24/2011   No intervention - Recommendation-angioplasty/stenting of Lft subclavian artery, high risk replacement of aortic valve w/ biological prosthesis and redo CABG w/ preservation of LIMA bypass and new SVG to PDA and OM, reevaluate carotid arteries and pulmonary function prior to surgery.  Marland Kitchen CARDIAC CATHETERIZATION N/A 10/29/2015   Procedure: Coronary/Graft Angiography;  Surgeon: Lorretta Harp, MD;  Location: [place] CV LAB;  Service: Cardiovascular;  Laterality: N/A;  . CARDIAC VALVE REPLACEMENT    . CARDIOVASCULAR STRESS TEST  07/10/2009   Evidence of mild ischemia in Basal Anteriolateral and Mid Anterolateral regions-additional diaphragmatic attenuation. No ECG changes. Low risk.\  . CAROTID DOPPLER  10/18/2012   Rt bulb/prox ICA demonstrated 50-69% diameter reduction, Lft mid ICA demonstrated 50-69% diameter reduction, Lft subclavian- = to or <50% sig diameter reduction, Lft vertebral-appeared occluded  . CAROTID ENDARTERECTOMY  10/28/96  DR. TODD EARLY   LEFT AND DACRON PATCH ANGIOPLASTY  . CATARACT EXTRACTION Right 2017  . CORONARY ARTERY BYPASS GRAFT  03/28/94 DR. CHARLES WILSON   x5(LIMA to LAD,SVG to OM1-OM2-OM4, SVG to RCA  . EP IMPLANTABLE DEVICE N/A 11/29/2015   Procedure: BiV Upgrade;  Surgeon: Will Meredith Leeds, MD;  Location: [facility] CV LAB;  Service: Cardiovascular;  Laterality: N/A;  . EYE SURGERY    . LEFT  HEART CATHETERIZATION WITH CORONARY/GRAFT ANGIOGRAM N/A 06/24/2011   Procedure: LEFT HEART CATHETERIZATION WITH Beatrix Fetters;  Surgeon: Sanda Klein, MD;  Location: [facility] CATH LAB;  Service: Cardiovascular;  Laterality: N/A;  . PACEMAKER INSERTION  11/12/2011   Medtronic Adapta; model# ADDR01, serial# [id number]  . PERIPHERAL VASCULAR ANGIOPLASTY  07/29/2011   Lft Subclavian 90% stenosis, stented w/ a 7x47mm long Cordis Genesis resulting in reduction of 90% stenosos to 0% resdiual.  . TRANSTHORACIC ECHOCARDIOGRAM  08/05/2012   EF 50-55%, septial motion showed abnormal function and dyssynergy, LA mild-moderately dilated     A IV Location/Drains/Wounds Patient Lines/Drains/Airways Status   Active Line/Drains/Airways    Name:   Placement date:   Placement time:   Site:   Days:   Peripheral IV 02/15/19 Left Antecubital   02/15/19    1929    Antecubital   1   Incision (Closed) 11/29/15 Chest Left   11/29/15    1015     1175          Intake/Output Last 24 hours No intake or output data in the 24 hours ending 02/16/19 0018  Labs/Imaging Results for orders placed or performed during the hospital encounter of 02/15/19 (from the past 48 hour(s))  CBC with Differential     Status: Abnormal   Collection Time: 02/15/19  7:23 PM  Result Value Ref Range   WBC 12.6 (H) 4.0 - 10.5 K/uL   RBC 3.99 (L) 4.22 - 5.81 MIL/uL   Hemoglobin 12.4 (L) 13.0 - 17.0 g/dL   HCT 38.7 (L) 39.0 - 52.0 %   MCV 97.0 80.0 - 100.0 fL   MCH 31.1 26.0 - 34.0 pg   MCHC 32.0 30.0 - 36.0 g/dL   RDW 14.6 11.5 - 15.5 %   Platelets 238 150 - 400 K/uL   nRBC 0.0 0.0 - 0.2 %   Neutrophils Relative % 82 %   Neutro Abs 10.2 (H) 1.7 - 7.7 K/uL   Lymphocytes Relative 8 %   Lymphs Abs 1.0 0.7 - 4.0 K/uL   Monocytes Relative 9 %   Monocytes Absolute 1.2 (H) 0.1 - 1.0 K/uL   Eosinophils Relative 0 %   Eosinophils Absolute 0.0 0.0 - 0.5 K/uL   Basophils Relative 0 %   Basophils Absolute 0.1 0.0 - 0.1 K/uL   Immature  Granulocytes 1 %   Abs Immature Granulocytes 0.12 (H) 0.00 - 0.07 K/uL    Comment: Performed at [facility], [place] [address], [place], [place] [address]  Comprehensive metabolic panel     Status: Abnormal   Collection Time: 02/15/19  7:23 PM  Result Value Ref Range   Sodium 139 135 - 145 mmol/L   Potassium 4.6 3.5 - 5.1 mmol/L   Chloride 103 98 - 111 mmol/L   CO2 26 22 - 32 mmol/L   Glucose, Bld 121 (H) 70 - 99 mg/dL   BUN 34 (H) 8 - 23 mg/dL   Creatinine, Ser 1.08 0.61 - 1.24 mg/dL   Calcium 8.8 (L) 8.9 - 10.3 mg/dL   Total Protein 6.8 6.5 - 8.1 g/dL   Albumin 2.6 (L) 3.5 - 5.0 g/dL   AST 85 (H)  15 - 41 U/L   ALT 99 (H) 0 - 44 U/L   Alkaline Phosphatase 153 (H) 38 - 126 U/L   Total Bilirubin 0.8 0.3 - 1.2 mg/dL   GFR calc non Af Amer >60 >60 mL/min   GFR calc Af Amer >60 >60 mL/min   Anion gap 10 5 - 15    Comment: Performed at [facility], [place] [address], [place], [place] [address]  Troponin I (High Sensitivity)     Status: None   Collection Time: 02/15/19  7:23 PM  Result Value Ref Range   Troponin I (High Sensitivity) 8 <18 ng/L    Comment: (NOTE) Elevated high sensitivity troponin I (hsTnI) values and significant  changes across serial measurements may suggest ACS but many other  chronic and acute conditions are known to elevate hsTnI results.  Refer to the "Links" section for chest pain algorithms and additional  guidance. Performed at [facility], [place] [address], [place], [place] [address]   Brain natriuretic peptide     Status: Abnormal   Collection Time: 02/15/19  7:24 PM  Result Value Ref Range   B Natriuretic Peptide 133.0 (H) 0.0 - 100.0 pg/mL    Comment: Performed at [facility], [place] [address], [place], [place] [address]  Troponin I (High Sensitivity)     Status: None   Collection Time: 02/15/19  9:15 PM  Result Value Ref Range   Troponin I (High Sensitivity) 8 <18 ng/L     Comment: (NOTE) Elevated high sensitivity troponin I (hsTnI) values and significant  changes across serial measurements may suggest ACS but many other  chronic and acute conditions are known to elevate hsTnI results.  Refer to the "Links" section for chest pain algorithms and additional  guidance. Performed at [facility], [place] [address], [place], [place] [address]   Protime-INR     Status: None   Collection Time: 02/15/19  9:28 PM  Result Value Ref Range   Prothrombin Time 13.3 11.4 - 15.2 seconds   INR 1.0 0.8 - 1.2    Comment: (NOTE) INR goal varies based on device and disease states. Performed at [facility], [place] [address] [place], [place] [address]    DG Chest 2 View  Result Date: 02/15/2019 CLINICAL DATA:  Shortness of breath. EXAM: CHEST - 2 VIEW COMPARISON:  02/09/2019. FINDINGS: AICD noted in stable position. Prior cardiac valve replacement. Heart size normal. Left mid lung field infiltrate and left-sided pleural effusion again noted. Interim slight increase in left-sided pleural effusion from prior exam. No pneumothorax. Surgical clips left neck. Degenerative changes thoracic spine multiple stable thoracic vertebral body compression fractures. 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 IMPRESSION: 1. AICD noted stable position. Prior cardiac valve replacement. Heart size normal. 2. Prominent left mid lung field infiltrate and moderate left-sided pleural effusion again noted. Slight increase in left pleural effusion noted from prior exam. Electronically Signed   By: Marcello Moores  Register   On: 02/15/2019 15:49    Pending Labs Unresulted Labs (From admission, onward)    Start     Ordered   02/15/19 2222  SARS CORONAVIRUS 2 (TAT 6-24 HRS) Nasopharyngeal Nasopharyngeal Swab  (Tier 3 (TAT 6-24 hrs))  ONCE - STAT,   STAT    Question Answer Comment  Is this test for diagnosis or screening Screening   Symptomatic for COVID-19 as defined by CDC  No   Hospitalized for COVID-19 No   Admitted  to ICU for COVID-19 No   Previously tested for COVID-19 Yes   Resident in a congregate (group) care setting No   Employed in healthcare setting No      02/15/19 2221          Vitals/Pain Today's Vitals   02/15/19 2200 02/15/19 2230 02/15/19 2300 02/15/19 2330  BP: 134/60 (!) 108/56 (!) 126/59 124/73  Pulse: 70 70 70 69  Resp: (!) 23 (!) 26 (!) 22 (!) 27  Temp:      TempSrc:      SpO2: 94% 93% 94% 94%  Weight:      Height:      PainSc:        Isolation Precautions No active isolations  Medications Medications - No data to display  Mobility walks with person assist Low fall risk   Focused Assessments Pulmonary Assessment Handoff:  Lung sounds: Bilateral Breath Sounds: Diminished L Breath Sounds: Diminished R Breath Sounds: Diminished O2 Device: Room Air        R Recommendations: See Admitting Provider Note  Report given to:   Additional Notes: N/A

## 2019-02-16 NOTE — Plan of Care (Signed)
Consult acknowledged. Agree with IR consult for thora. Orders placed for left pleural fluid given concern for malignancy:  Cytology Culture Glucose Cell count with diff Protein LDH  Will see patient this afternoon.  Julian Hy, DO 02/16/19 12:59 PM [facility]

## 2019-02-16 NOTE — Progress Notes (Signed)
Shelly Coss, MD paged regarding the pt's Daughter's request for an update from the MD.   Dearion Huot (Daughter) [phone]

## 2019-02-16 NOTE — Progress Notes (Addendum)
Patient is a 86 year old male with history of coronary disease status post CABG, status post TAVR, pacemaker placement for complete heart block, COPD who was recently found to have left lung mass with pleural effusion presented to the emergency department for the evaluation of shortness of breath.  He follows with pulmonology, Dr. Melvyn Novas.  He was scheduled to have a thoracentesis done as an outpatient but he presented to the emergency department with worsening dyspnea.  He recently took antibiotics for left lung infiltrates.  No fever, chills on presentation. On presentation, chest x-ray showed worsening left pleural effusion, left lung infiltrate.  It is questionable that he has active  Pneumonia at present.  He does not have any fever, chills,but  has mild leukocytosis .As per the report, he was treated with antibiotics recently.  So this could be a old infiltrate.  Antibiotics not started.   Covid test negative.  PCCM consulted and the plan is for thoracentesis. Currently he is on 4 L of oxygen per minute.  We will try to taper the oxygen.  He is not on oxygen at home though he has history of COPD. Patient seen and examined at the bedside this morning.  Hemodynamically stable. Auscultation revealed crackles and decreased air entry on the left side.  Patient is a very poor historian and did not contribute much to the information. Patient seen by my colleague Dr. Hal Hope this morning.  I agree with his  assessment and plan. We will continue to monitor the patient.  I touched base with PCCM for the plan of thoracentesis.  At this time I will request for IR guided thoracentesis.  Pleural fluid analysis should be sent. I called her wife on phone today.  She says that he has a appointment for PET scan on 02/21/2019 as scheduled by Dr Melvyn Novas.

## 2019-02-16 NOTE — Progress Notes (Addendum)
VAST consult canceled as IR able to place PIV prior to procedure.

## 2019-02-16 NOTE — Progress Notes (Signed)
Upon checking on the pt, the NT reported bleeding from the arm. IV was accidentally removed per pt. IV team consult ordered. Site cleaned, gauze and tape applied.

## 2019-02-16 NOTE — Consult Note (Signed)
NAME:  Luis Townsend, MRN:  [id number], DOB:  June 02, 1928, LOS: 0 ADMISSION DATE:  02/15/2019, CONSULTATION DATE:  02/15/2018 REFERRING MD:  Tawanna Solo, CHIEF COMPLAINT:  SOB, pleural effusion   Brief History   Lung mass, enlarging pleural effusion, progressive SOB  History of present illness   Luis Townsend is an 86 y/o gentleman admitted for acute hypoxic respiratory failure attributed to a pleural effusion.  He has been monitored for this pleural effusion as an outpatient, and had an outpatient thoracentesis scheduled.  He was not able to wait for this due to progressive shortness of breath.  In the ED he was saturating 90% on 2 L.  He has never had a previous thoracentesis.  He has had progressive dyspnea on exertion for the past month.  At baseline he can walk about 30 feet, but recently can only walk about 10 to 12 feet.  He has had increased sputum production but denies hemoptysis.  He has had some mild foot and ankle edema recently.  He admits to weight loss and reduced p.o. intake recently.  He is not sure if he has taken antibiotics recently, but per chart review he was prescribed Augmentin on 11/25 and again on 12/9.  His history is limited by his memory.  He asked me multiple times throughout the encounter who I was.  Former 60-pack-year smoking history per chart review.  Past Medical History  COPD CAD s/p CABG x 5v in 1996 Complete heart block s/p pacemaker in 2014 Severe aortic stenosis s/p TAVR  Significant Hospital Events   Thora 1/6  Consults:  Pulmonlogy  Procedures:  thora 02/16/19  Significant Diagnostic Tests:  2018 echo: LVEF 50 to 25%, grade 1 diastolic dysfunction.  TAVR.  CXR 1/6: left sided dependent effusion, LLL opacity  Ct chest 11/25- left dependent pleural effusion, LLL supererior segment large peripheral opacity without air bronchograms, LLL peripheral and medial pleural based opacities with hypodense center, mediastinal adenopathy  CT abd/ pelvis: persistent  effusion, smaller medial LLL lung opacity. Unable to assess mediastinum or superior segment of LLL.    Micro Data:  SARS-CoV-2 negative  Antimicrobials:    Interim history/subjective:    Objective   Blood pressure (!) 117/54, pulse 73, temperature 97.8 F (36.6 C), temperature source Oral, resp. rate (!) 24, height 5' 5.5" (1.664 m), weight 61.9 kg, SpO2 98 %.        Intake/Output Summary (Last 24 hours) at 02/16/2019 1444 Last data filed at 02/16/2019 1011 Gross per 24 hour  Intake 30 ml  Output 200 ml  Net -170 ml   Filed Weights   02/15/19 1526  Weight: 61.9 kg    Examination: General: Frail appearing elderly man lying in bed in no acute distress HENT: [place]/AT, mild temporal wasting, eyes anicteric Lungs: Reduced bibasilar breath sounds, wet sounding cough.  Mild conversational dyspnea.  Breathing comfortably on 2 L nasal cannula. Cardiovascular: Regular rate and rhythm, no murmurs.  Pacemaker in left chest. Abdomen: Soft, nondistended Extremities: Pitting edema to the lower shins.  No clubbing Neuro: Forgetful, tangential conversations.  Poor historian.  Normal speech.  Globally very weak, but moving all extremities. Derm: No rashes or wounds  Resolved Hospital Problem list     Assessment & Plan:  Acute hypoxic respiratory failure due to pleural effusion and lung mass vs pneumonia -thora today- checking cytology, culture, LDH, protein, glucose, cell count with diff -Repeat chest CT tonight -Empiric antibiotics given low pleural fluid glucose -Titrate down supplemental oxygen as  able -Continue to follow pleural fluid cytology.  If this is unrevealing and the superior segment lung mass persists, he will likely need a lung biopsy.  I am concerned with his poor functional status that he describes at baseline.  COPD -Continue LAMA, LABA, ICS -Nebs as needed  Leg edema, h/o CABG & TAVR -echo  Debility -PT and OT recommended   Labs   CBC: Recent Labs  Lab  02/15/19 1923 02/16/19 0457  WBC 12.6* 12.5*  NEUTROABS 10.2*  --   HGB 12.4* 11.7*  HCT 38.7* 36.4*  MCV 97.0 95.5  PLT 238 423    Basic Metabolic Panel: Recent Labs  Lab 02/15/19 1923 02/16/19 0457  NA 139 140  K 4.6 4.7  CL 103 104  CO2 26 25  GLUCOSE 121* 94  BUN 34* 36*  CREATININE 1.08 0.96  CALCIUM 8.8* 8.4*   GFR: Estimated Creatinine Clearance: 46.6 mL/min (by C-G formula based on SCr of 0.96 mg/dL). Recent Labs  Lab 02/15/19 1923 02/16/19 0457  WBC 12.6* 12.5*    Liver Function Tests: Recent Labs  Lab 02/15/19 1923  AST 85*  ALT 99*  ALKPHOS 153*  BILITOT 0.8  PROT 6.8  ALBUMIN 2.6*   No results for input(s): LIPASE, AMYLASE in the last 168 hours. No results for input(s): AMMONIA in the last 168 hours.  ABG    Component Value Date/Time   PHART 7.529 (H) 01/05/2019 0309   PCO2ART 28.5 (L) 01/05/2019 0309   PO2ART 71.0 (L) 01/05/2019 0309   HCO3 23.8 01/05/2019 0309   TCO2 25 01/05/2019 0309   ACIDBASEDEF 0.9 07/09/2011 1100   O2SAT 96.0 01/05/2019 0309     Coagulation Profile: Recent Labs  Lab 02/15/19 2128  INR 1.0    Cardiac Enzymes: No results for input(s): CKTOTAL, CKMB, CKMBINDEX, TROPONINI in the last 168 hours.  HbA1C: No results found for: HGBA1C  CBG: No results for input(s): GLUCAP in the last 168 hours.  Review of Systems:   Dyspnea, cough, leg edema, mild weight loss and reduced appetite.  Otherwise negative review of systems.  Past Medical History  He,  has a past medical history of Amaurosis fugax of left eye (10/11/1996), Aortic stenosis (06/30/2011), Aortoiliac occlusive disease ([place]) (06/30/2011), Cardiac resynchronization therapy pacemaker (CRT-P) in place, Cerebrovascular disease (06/30/2011), CHB (complete heart block) ([place]) (02/03/2013), COPD (chronic obstructive pulmonary disease), moderat to severe (07/29/2011), Coronary artery disease, Hyperlipidemia, Hypertension, Myocardial infarction ([place]) (03/22/1994), PAD  (peripheral artery disease) ([place]), RBBB (right bundle branch block), S/P CABG x 5 (03/28/1994), S/P carotid endarterectomy (10/28/1996), Subclavian artery stenosis, left ([place]) (06/30/2011), and Tobacco abuse.   Surgical History    Past Surgical History:  Procedure Laterality Date  . BILATERAL UPPER EXTREMITY ANGIOGRAM N/A 07/29/2011   Procedure: BILATERAL UPPER EXTREMITY ANGIOGRAM;  Surgeon: Lorretta Harp, MD;  Location: [facility] CATH LAB;  Service: Cardiovascular;  Laterality: N/A;  . CARDIAC CATHETERIZATION  06/24/2011   No intervention - Recommendation-angioplasty/stenting of Lft subclavian artery, high risk replacement of aortic valve w/ biological prosthesis and redo CABG w/ preservation of LIMA bypass and new SVG to PDA and OM, reevaluate carotid arteries and pulmonary function prior to surgery.  Marland Kitchen CARDIAC CATHETERIZATION N/A 10/29/2015   Procedure: Coronary/Graft Angiography;  Surgeon: Lorretta Harp, MD;  Location: [place] CV LAB;  Service: Cardiovascular;  Laterality: N/A;  . CARDIAC VALVE REPLACEMENT    . CARDIOVASCULAR STRESS TEST  07/10/2009   Evidence of mild ischemia in Basal Anteriolateral and Mid Anterolateral regions-additional  diaphragmatic attenuation. No ECG changes. Low risk.\  . CAROTID DOPPLER  10/18/2012   Rt bulb/prox ICA demonstrated 50-69% diameter reduction, Lft mid ICA demonstrated 50-69% diameter reduction, Lft subclavian- = to or <50% sig diameter reduction, Lft vertebral-appeared occluded  . CAROTID ENDARTERECTOMY  10/28/96  DR. TODD EARLY   LEFT AND DACRON PATCH ANGIOPLASTY  . CATARACT EXTRACTION Right 2017  . CORONARY ARTERY BYPASS GRAFT  03/28/94 DR. CHARLES WILSON   x5(LIMA to LAD,SVG to OM1-OM2-OM4, SVG to RCA  . EP IMPLANTABLE DEVICE N/A 11/29/2015   Procedure: BiV Upgrade;  Surgeon: Will Meredith Leeds, MD;  Location: [place] CV LAB;  Service: Cardiovascular;  Laterality: N/A;  . EYE SURGERY    . LEFT HEART CATHETERIZATION WITH CORONARY/GRAFT ANGIOGRAM N/A  06/24/2011   Procedure: LEFT HEART CATHETERIZATION WITH Beatrix Fetters;  Surgeon: Sanda Klein, MD;  Location: [place] CATH LAB;  Service: Cardiovascular;  Laterality: N/A;  . PACEMAKER INSERTION  11/12/2011   Medtronic Adapta; model# ADDR01, serial# [id number]  . PERIPHERAL VASCULAR ANGIOPLASTY  07/29/2011   Lft Subclavian 90% stenosis, stented w/ a 7x25mm long Cordis Genesis resulting in reduction of 90% stenosos to 0% resdiual.  . TRANSTHORACIC ECHOCARDIOGRAM  08/05/2012   EF 50-55%, septial motion showed abnormal function and dyssynergy, LA mild-moderately dilated     Social History   reports that he quit smoking about 7 years ago. His smoking use included cigarettes. He has a 60.00 pack-year smoking history. He has never used smokeless tobacco. He reports that he does not drink alcohol or use drugs.   Family History   His family history includes Diabetes in his maternal grandfather; Heart attack in his paternal grandmother; Heart attack (age of onset: 91) in his father; Heart disease in his father; Other in his father; Pneumonia in his maternal grandmother; Stroke (age of onset: 53) in his mother.   Allergies No Known Allergies   Home Medications  Prior to Admission medications   Medication Sig Start Date End Date Taking? Authorizing Provider  albuterol (VENTOLIN HFA) 108 (90 Base) MCG/ACT inhaler INHALE 2 PUFFS BY MOUTH EVERY 4 HOURS AS NEEDED Patient taking differently: Inhale 2 puffs into the lungs every 4 (four) hours as needed for wheezing or shortness of breath.  12/20/18  Yes Tanda Rockers, MD  aspirin EC 81 MG tablet Take 81 mg by mouth daily.   Yes [provider]  carvedilol (COREG) 12.5 MG tablet Take 1 tablet (12.5 mg total) by mouth 2 (two) times daily. 10/19/18 02/15/19 Yes Camnitz, Will Hassell Done, MD  ezetimibe (ZETIA) 10 MG tablet TAKE 1 TABLET BY MOUTH EVERY DAY Patient taking differently: Take 10 mg by mouth daily.  12/07/18  Yes Croitoru, Mihai, MD    Fluticasone-Umeclidin-Vilant (TRELEGY ELLIPTA) 100-62.5-25 MCG/INH AEPB Inhale 1 puff into the lungs daily. 02/09/19  Yes Tanda Rockers, MD  furosemide (LASIX) 20 MG tablet TAKE 1 TABLET BY MOUTH EVERY DAY Patient taking differently: Take 20 mg by mouth daily.  12/07/18  Yes Croitoru, Mihai, MD  naproxen (NAPROSYN) 500 MG tablet Take 1 tablet (500 mg total) by mouth 2 (two) times daily with a meal. 02/01/19  Yes Nafziger, [name], NP  rosuvastatin (CRESTOR) 40 MG tablet TAKE 1 TABLET BY MOUTH EVERY DAY Patient taking differently: Take 40 mg by mouth at bedtime.  09/01/18  Yes Camnitz, Ocie Doyne, MD

## 2019-02-16 NOTE — H&P (Addendum)
History and Physical    SRIRAM FEBLES SWN:[id number] DOB: [birth year] DOA: 02/15/2019  PCP: Dorothyann Peng, NP  Patient coming from: Home.  Chief Complaint: Shortness of breath.  HPI: Luis Townsend is a 86 y.o. male with history of CAD status post CABG, status post TAVR, pacemaker placement for complete heart block and COPD who has been recently found to have left lung mass with pleural effusion originally scheduled to have thoracentesis done as outpatient presents to the ER because of worsening shortness of breath.  Patient had recently taken antibiotics for left lung infiltrates.  Denies any chest pain fever chills nausea vomiting or diarrhea.  ED Course: In the ER chest x-ray shows worsening of the left pleural effusion and ER physician discussed with on-call pulmonologist who will be seeing patient in consult.  Patient had a Covid test done yesterday which was negative.  Presently 1 is pending.  Labs show LFTs were mildly elevated with AST of 85 ALT of 99 hemoglobin 12.4 WBC 12.6 BNP of 133 high-sensitivity troponin was negative.  EKG was showing paced rhythm.  Review of Systems: As per HPI, rest all negative.   Past Medical History:  Diagnosis Date  . Amaurosis fugax of left eye 10/11/1996  . Aortic stenosis 06/30/2011   severe  . Aortoiliac occlusive disease ([place]) 06/30/2011  . Cardiac resynchronization therapy pacemaker (CRT-P) in place    a. 11/29/15:  Medtronic model X3GH82 Percepta Quad CRT-P MRI SureScan (serial Number [id number] )  . Cerebrovascular disease 06/30/2011  . CHB (complete heart block) ([place]) 02/03/2013  . COPD (chronic obstructive pulmonary disease), moderat to severe 07/29/2011  . Coronary artery disease    a. 10/29/15 Cath for decreased EF with patent 2/3 grafts, no change   . Hyperlipidemia   . Hypertension   . Myocardial infarction ([place]) 03/22/1994   acute LAD occlusion  . PAD (peripheral artery disease) ([place])   . RBBB (right bundle branch block)   . S/P CABG  x 5 03/28/1994   LIMA to LAD, sequential SVG to OM1-OM2-OM4, SVG to RCA, open vein harvest right leg - Dr Redmond Pulling  . S/P carotid endarterectomy 10/28/1996   Dr Donnetta Hutching  . Subclavian artery stenosis, left ([place]) 06/30/2011  . Tobacco abuse     Past Surgical History:  Procedure Laterality Date  . BILATERAL UPPER EXTREMITY ANGIOGRAM N/A 07/29/2011   Procedure: BILATERAL UPPER EXTREMITY ANGIOGRAM;  Surgeon: Lorretta Harp, MD;  Location: [facility] CATH LAB;  Service: Cardiovascular;  Laterality: N/A;  . CARDIAC CATHETERIZATION  06/24/2011   No intervention - Recommendation-angioplasty/stenting of Lft subclavian artery, high risk replacement of aortic valve w/ biological prosthesis and redo CABG w/ preservation of LIMA bypass and new SVG to PDA and OM, reevaluate carotid arteries and pulmonary function prior to surgery.  Marland Kitchen CARDIAC CATHETERIZATION N/A 10/29/2015   Procedure: Coronary/Graft Angiography;  Surgeon: Lorretta Harp, MD;  Location: [facility] CV LAB;  Service: Cardiovascular;  Laterality: N/A;  . CARDIAC VALVE REPLACEMENT    . CARDIOVASCULAR STRESS TEST  07/10/2009   Evidence of mild ischemia in Basal Anteriolateral and Mid Anterolateral regions-additional diaphragmatic attenuation. No ECG changes. Low risk.\  . CAROTID DOPPLER  10/18/2012   Rt bulb/prox ICA demonstrated 50-69% diameter reduction, Lft mid ICA demonstrated 50-69% diameter reduction, Lft subclavian- = to or <50% sig diameter reduction, Lft vertebral-appeared occluded  . CAROTID ENDARTERECTOMY  10/28/96  DR. TODD EARLY   LEFT AND DACRON PATCH ANGIOPLASTY  . CATARACT EXTRACTION Right 2017  . CORONARY  ARTERY BYPASS GRAFT  03/28/94 DR. CHARLES WILSON   x5(LIMA to LAD,SVG to OM1-OM2-OM4, SVG to RCA  . EP IMPLANTABLE DEVICE N/A 11/29/2015   Procedure: BiV Upgrade;  Surgeon: Will Meredith Leeds, MD;  Location: [place] [facility] LAB;  Service: Cardiovascular;  Laterality: N/A;  . EYE SURGERY    . LEFT HEART CATHETERIZATION WITH CORONARY/GRAFT  ANGIOGRAM N/A 06/24/2011   Procedure: LEFT HEART CATHETERIZATION WITH Beatrix Fetters;  Surgeon: Sanda Klein, MD;  Location: [facility] CATH LAB;  Service: Cardiovascular;  Laterality: N/A;  . PACEMAKER INSERTION  11/12/2011   Medtronic Adapta; model# ADDR01, serial# [id number]  . PERIPHERAL VASCULAR ANGIOPLASTY  07/29/2011   Lft Subclavian 90% stenosis, stented w/ a 7x85mm long Cordis Genesis resulting in reduction of 90% stenosos to 0% resdiual.  . TRANSTHORACIC ECHOCARDIOGRAM  08/05/2012   EF 50-55%, septial motion showed abnormal function and dyssynergy, LA mild-moderately dilated     reports that he quit smoking about 7 years ago. His smoking use included cigarettes. He has a 60.00 pack-year smoking history. He has never used smokeless tobacco. He reports that he does not drink alcohol or use drugs.  No Known Allergies  Family History  Problem Relation Age of Onset  . Stroke Mother 42       died of stroke  . Heart attack Father 27       did not drink  . Heart disease Father   . Other Father        polio  . Pneumonia Maternal Grandmother   . Diabetes Maternal Grandfather   . Heart attack Paternal Grandmother     Prior to Admission medications   Medication Sig Start Date End Date Taking? Authorizing Provider  albuterol (VENTOLIN HFA) 108 (90 Base) MCG/ACT inhaler INHALE 2 PUFFS BY MOUTH EVERY 4 HOURS AS NEEDED Patient taking differently: Inhale 2 puffs into the lungs every 4 (four) hours as needed for wheezing or shortness of breath.  12/20/18  Yes Tanda Rockers, MD  aspirin EC 81 MG tablet Take 81 mg by mouth daily.   Yes [provider]  carvedilol (COREG) 12.5 MG tablet Take 1 tablet (12.5 mg total) by mouth 2 (two) times daily. 10/19/18 02/15/19 Yes Camnitz, Will Hassell Done, MD  ezetimibe (ZETIA) 10 MG tablet TAKE 1 TABLET BY MOUTH EVERY DAY Patient taking differently: Take 10 mg by mouth daily.  12/07/18  Yes Croitoru, Mihai, MD  Fluticasone-Umeclidin-Vilant (TRELEGY  ELLIPTA) 100-62.5-25 MCG/INH AEPB Inhale 1 puff into the lungs daily. 02/09/19  Yes Tanda Rockers, MD  furosemide (LASIX) 20 MG tablet TAKE 1 TABLET BY MOUTH EVERY DAY Patient taking differently: Take 20 mg by mouth daily.  12/07/18  Yes Croitoru, Mihai, MD  naproxen (NAPROSYN) 500 MG tablet Take 1 tablet (500 mg total) by mouth 2 (two) times daily with a meal. 02/01/19  Yes Nafziger, [name], NP  rosuvastatin (CRESTOR) 40 MG tablet TAKE 1 TABLET BY MOUTH EVERY DAY Patient taking differently: Take 40 mg by mouth at bedtime.  09/01/18  Yes Camnitz, Ocie Doyne, MD    Physical Exam: Constitutional: Moderately built and nourished. Vitals:   02/15/19 2230 02/15/19 2300 02/15/19 2330 02/16/19 0038  BP: (!) 108/56 (!) 126/59 124/73 127/71  Pulse: 70 70 69 75  Resp: (!) 26 (!) 22 (!) 27 20  Temp:    97.8 F (36.6 C)  TempSrc:    Oral  SpO2: 93% 94% 94% 97%  Weight:      Height:  Eyes: Anicteric no pallor. ENMT: No discharge from the ears eyes nose or mouth. Neck: No mass or.  No neck rigidity. Respiratory: No rhonchi or crepitations. Cardiovascular: S1-S2 heard. Abdomen: Soft nontender bowel sounds present. Musculoskeletal: No edema.  No joint effusion. Skin: No rash. Neurologic: Alert awake oriented to time place and person.  Moves all extremities. Psychiatric: Appears normal global affect.   Labs on Admission: I have personally reviewed following labs and imaging studies  CBC: Recent Labs  Lab 02/09/19 1120 02/15/19 1923  WBC 10.8* 12.6*  NEUTROABS 8.6* 10.2*  HGB 11.4* 12.4*  HCT 34.1* 38.7*  MCV 92.9 97.0  PLT 253.0 338   Basic Metabolic Panel: Recent Labs  Lab 02/09/19 1120 02/15/19 1923  NA 136 139  K 4.2 4.6  CL 99 103  CO2 30 26  GLUCOSE 120* 121*  BUN 30* 34*  CREATININE 0.98 1.08  CALCIUM 8.5 8.8*   GFR: Estimated Creatinine Clearance: 41.4 mL/min (by C-G formula based on SCr of 1.08 mg/dL). Liver Function Tests: Recent Labs  Lab 02/15/19 1923    AST 85*  ALT 99*  ALKPHOS 153*  BILITOT 0.8  PROT 6.8  ALBUMIN 2.6*   No results for input(s): LIPASE, AMYLASE in the last 168 hours. No results for input(s): AMMONIA in the last 168 hours. Coagulation Profile: Recent Labs  Lab 02/15/19 2128  INR 1.0   Cardiac Enzymes: No results for input(s): CKTOTAL, CKMB, CKMBINDEX, TROPONINI in the last 168 hours. BNP (last 3 results) No results for input(s): PROBNP in the last 8760 hours. HbA1C: No results for input(s): HGBA1C in the last 72 hours. CBG: No results for input(s): GLUCAP in the last 168 hours. Lipid Profile: No results for input(s): CHOL, HDL, LDLCALC, TRIG, CHOLHDL, LDLDIRECT in the last 72 hours. Thyroid Function Tests: No results for input(s): TSH, T4TOTAL, FREET4, T3FREE, THYROIDAB in the last 72 hours. Anemia Panel: No results for input(s): VITAMINB12, FOLATE, FERRITIN, TIBC, IRON, RETICCTPCT in the last 72 hours. Urine analysis: No results found for: COLORURINE, APPEARANCEUR, LABSPEC, PHURINE, GLUCOSEU, HGBUR, BILIRUBINUR, KETONESUR, PROTEINUR, UROBILINOGEN, NITRITE, LEUKOCYTESUR Sepsis Labs: @LABRCNTIP (procalcitonin:4,lacticidven:4) ) Recent Results (from the past 240 hour(s))  Novel Coronavirus, NAA (Hosp order, Send-out to Ref Lab; TAT 18-24 hrs     Status: None   Collection Time: 02/14/19 11:47 AM   Specimen: Nasopharyngeal Swab; Respiratory  Result Value Ref Range Status   SARS-CoV-2, NAA NOT DETECTED NOT DETECTED Final    Comment: (NOTE) This nucleic acid amplification test was developed and its performance characteristics determined by [employer]. Nucleic acid amplification tests include PCR and TMA. This test has not been FDA cleared or approved. This test has been authorized by FDA under an Emergency Use Authorization (EUA). This test is only authorized for the duration of time the declaration that circumstances exist justifying the authorization of the emergency use of in vitro diagnostic  tests for detection of SARS-CoV-2 virus and/or diagnosis of COVID-19 infection under section 564(b)(1) of the Act, 21 U.S.C. 250NLZ-7(Q) (1), unless the authorization is terminated or revoked sooner. When diagnostic testing is negative, the possibility of a false negative result should be considered in the context of a patient's recent exposures and the presence of clinical signs and symptoms consistent with COVID-19. An individual without symptoms of COVID- 19 and who is not shedding SARS-CoV-2 vi rus would expect to have a negative (not detected) result in this assay. Performed At: [facility] [place], [place] [id number] Rush Farmer MD [place]:[id number]  Coronavirus Source NASOPHARYNGEAL  Final    Comment: Performed at [facility], [place] [address], [place], [place] [address]     Radiological Exams on Admission: DG Chest 2 View  Result Date: 02/15/2019 CLINICAL DATA:  Shortness of breath. EXAM: CHEST - 2 VIEW COMPARISON:  02/09/2019. FINDINGS: AICD noted in stable position. Prior cardiac valve replacement. Heart size normal. Left mid lung field infiltrate and left-sided pleural effusion again noted. Interim slight increase in left-sided pleural effusion from prior exam. No pneumothorax. Surgical clips left neck. Degenerative changes thoracic spine multiple stable thoracic vertebral body compression fractures. 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 IMPRESSION: 1. AICD noted stable position. Prior cardiac valve replacement. Heart size normal. 2. Prominent left mid lung field infiltrate and moderate left-sided pleural effusion again noted. Slight increase in left pleural effusion noted from prior exam. Electronically Signed   By: [place]   On: 02/15/2019 15:49    EKG: Independently reviewed.  Paced rhythm.  Assessment/Plan Principal Problem:   Acute respiratory failure with hypoxia (HCC) Active Problems:   S/P CABG x 5, stenosis to RCA VG  1995   S/P aortic valve replacement   COPD GOLD II vs III   HTN (hypertension)   S/P TAVR (transcatheter aortic valve replacement)   Mass of lower lobe of left lung   Pleural effusion on left    1. Acute respiratory failure with hypoxia with left pleural effusion and possible left lung mass for which pulmonary has been consulted planning to have a thoracentesis done.  We will keep patient n.p.o. in the morning. 2. History of CAD status post CABG denies any chest pain.  On aspirin beta-blockers and statins. 3. History of CHF appears compensated.  On Lasix. 4. COPD presently not actively wheezing continue inhalers. 5. Anemia appears to be chronic. 6. Complete heart block status post pacemaker placement. 7. Status post TAVR and carotid stent placement. 8. Mildly elevated LFT's. Follow LFT's. Abdomen appears benign.  Repeat COVID-19 test is pending.   DVT prophylaxis: SCDs in anticipation of procedure. Code Status: Full code. Family Communication: Discussed with patient. Disposition Plan: Home. Consults called: Pulmonary. Admission status: Observation.   Rise Patience MD [facility] Pager [phone].  If 7PM-7AM, please contact night-coverage [email or web address] Password [email or web address]  02/16/2019, 1:04 AM

## 2019-02-16 NOTE — Progress Notes (Signed)
Pt admitted from ER, DOE noted when ambulating from strecher to bed, pt not on O2, placed on 2/l once in bed.  Lungs decreased t/o, 1 plus edema to lower legs and feet, pt is HOH, explained call light system and bed operation. Admitting nurse, admitting patient, tele on s/r 72, urinal given to pt.  Pt instructed to call for assistance, pt is a/o x 4 at this time, does have history of dementia.

## 2019-02-16 NOTE — Procedures (Signed)
Ultrasound-guided diagnostic and therapeutic left thoracentesis performed yielding 920 cc of hazy, amber fluid. No immediate complications. Follow-up chest x-ray pending. The fluid was sent to the lab for preordered studies. EBL < 1 cc.

## 2019-02-16 NOTE — Evaluation (Addendum)
Physical Therapy Evaluation Patient Details Name: Luis Townsend MRN: [id number] DOB: [birth year] Today's Date: 02/16/2019   History of Present Illness  Patient is a 86 year old male with history of coronary disease status post CABG, status post TAVR, pacemaker placement for complete heart block, COPD who was recently found to have left lung mass with pleural effusion presented to the emergency department for the evaluation of shortness of breath.  He follows with pulmonology, Dr. Melvyn Novas.  He was scheduled to have a thoracentesis done as an outpatient but he presented to the emergency department with worsening dyspnea.  He recently took antibiotics for left lung infiltrates.  No fever, chills on presentation.    Clinical Impression  Luis Townsend is 86 y.o. male admitted with above HPI and diagnosis. Patient is currently limited by functional impairments below (see PT problem list). Patient lives with his wife and is independent with no supplemental O2 at baseline. Patient will benefit from continued skilled PT interventions to address impairments and progress independence with mobility, recommending HHPT. Acute PT will follow and progress as able.     Follow Up Recommendations Home health PT    Equipment Recommendations  Rolling walker with 5" wheels    Recommendations for Other Services       Precautions / Restrictions Precautions Precautions: Fall Restrictions Weight Bearing Restrictions: No      Mobility  Bed Mobility Overal bed mobility: Needs Assistance Bed Mobility: Supine to Sit     Supine to sit: HOB elevated     General bed mobility comments: pt requires extra time and use of bed rails, no assist required  Transfers Overall transfer level: Needs assistance Equipment used: Rolling walker (2 wheeled);None Transfers: Sit to/from Stand Sit to Stand: Min assist;Min guard         General transfer comment: min guard from EOB with no device and min assist from toilet  with RW. pt required cues for safe hand placement and technique with RW.  Ambulation/Gait   Gait Distance (Feet): 25 Feet(in hospital room) Assistive device: Rolling walker (2 wheeled);None   Gait velocity: decreased   General Gait Details: pt unsteady with no device for gait in room to bathroom, min assist required to steady and prevent LOB and manage portable O2, pt reaching for external support while ambulating. min assist/guard with RW and pt with improved steadiness. (SpO2 was 95% with 2L/min at rest and dropped to 91% during ambulation, O2 increased to 3L/min and SpO2 improved to 94%. reduced to 2L/min in sitting and SpO2 remained at 95% at EOS)  Stairs            Wheelchair Mobility    Modified Rankin (Stroke Patients Only)       Balance Overall balance assessment: Needs assistance Sitting-balance support: Feet supported Sitting balance-Leahy Scale: Good     Standing balance support: During functional activity;No upper extremity supported;Bilateral upper extremity supported Standing balance-Leahy Scale: Fair                Pertinent Vitals/Pain Pain Assessment: No/denies pain    Home Living Family/patient expects to be discharged to:: Private residence Living Arrangements: Spouse/significant other Available Help at Discharge: Family Type of Home: House Home Access: Stairs to enter   Technical brewer of Steps: 2 Home Layout: One level Home Equipment: None      Prior Function Level of Independence: Independent               Hand Dominance   Dominant Hand: Right  Extremity/Trunk Assessment   Upper Extremity Assessment Upper Extremity Assessment: Generalized weakness    Lower Extremity Assessment Lower Extremity Assessment: Generalized weakness    Cervical / Trunk Assessment Cervical / Trunk Assessment: Kyphotic  Communication   Communication: No difficulties  Cognition Arousal/Alertness: Awake/alert Behavior During  Therapy: WFL for tasks assessed/performed Overall Cognitive Status: Within Functional Limits for tasks assessed          General Comments: oriented x4      General Comments      Exercises Other Exercises Other Exercises: educated on pursed liip breathing throughout seated rest on toilet. pt reported 2/4 on dyspnea scale at rest and increased with gait to 3/4 but then improved with pursed liip breathing. Pt requried repeated cues for slow breathing in through nose and long slow breath out through mouth.   Assessment/Plan    PT Assessment Patient needs continued PT services  PT Problem List Decreased strength;Decreased balance;Decreased mobility;Decreased activity tolerance;Decreased knowledge of use of DME       PT Treatment Interventions DME instruction;Functional mobility training;Balance training;Patient/family education;Therapeutic activities;Gait training;Stair training;Therapeutic exercise    PT Goals (Current goals can be found in the Care Plan section)  Acute Rehab PT Goals Patient Stated Goal: to be able to breathe better and have more energy PT Goal Formulation: With patient Time For Goal Achievement: 03/02/19 Potential to Achieve Goals: Good    Frequency Min 3X/week    AM-PAC PT "6 Clicks" Mobility  Outcome Measure Help needed turning from your back to your side while in a flat bed without using bedrails?: A Little Help needed moving from lying on your back to sitting on the side of a flat bed without using bedrails?: A Little Help needed moving to and from a bed to a chair (including a wheelchair)?: A Little Help needed standing up from a chair using your arms (e.g., wheelchair or bedside chair)?: A Little Help needed to walk in hospital room?: A Little Help needed climbing 3-5 steps with a railing? : A Little 6 Click Score: 18    End of Session Equipment Utilized During Treatment: Gait belt Activity Tolerance: Patient limited by fatigue Patient left: with  call bell/phone within reach;in chair;with chair alarm set Nurse Communication: Mobility status PT Visit Diagnosis: Muscle weakness (generalized) (M62.81);Difficulty in walking, not elsewhere classified (R26.2);Unsteadiness on feet (R26.81)    Time: 6222-9798 PT Time Calculation (min) (ACUTE ONLY): 28 min   Charges:   PT Evaluation $PT Eval Low Complexity: 1 Low PT Treatments $Therapeutic Activity: 8-22 mins      Verner Mould, DPT Physical Therapist with [facility] [phone]  02/16/2019 3:18 PM

## 2019-02-16 NOTE — Progress Notes (Signed)
VAST consulted to place IV access. Pt currently not receiving IVF's or IV meds. Called unit and spoke with pt's nurse, Nadyne Coombes. Educated about vein preservation when IV not being used. Nadyne Coombes, RN concerned as pt is on telemetry and will be having fluid removed from body later and might need IV access at that time. Rosita Fire, RN that VAST will visit shortly to assess for IV placement; Shaina verbalized understanding.

## 2019-02-17 ENCOUNTER — Ambulatory Visit (HOSPITAL_COMMUNITY): Payer: Medicare Other

## 2019-02-17 ENCOUNTER — Inpatient Hospital Stay (HOSPITAL_COMMUNITY): Payer: Medicare Other

## 2019-02-17 DIAGNOSIS — R9431 Abnormal electrocardiogram [ECG] [EKG]: Secondary | ICD-10-CM

## 2019-02-17 LAB — CBC WITH DIFFERENTIAL/PLATELET
Abs Immature Granulocytes: 0.14 10*3/uL — ABNORMAL HIGH (ref 0.00–0.07)
Basophils Absolute: 0 10*3/uL (ref 0.0–0.1)
Basophils Relative: 0 %
Eosinophils Absolute: 0 10*3/uL (ref 0.0–0.5)
Eosinophils Relative: 0 %
HCT: 37 % — ABNORMAL LOW (ref 39.0–52.0)
Hemoglobin: 11.7 g/dL — ABNORMAL LOW (ref 13.0–17.0)
Immature Granulocytes: 1 %
Lymphocytes Relative: 6 %
Lymphs Abs: 0.8 10*3/uL (ref 0.7–4.0)
MCH: 30.5 pg (ref 26.0–34.0)
MCHC: 31.6 g/dL (ref 30.0–36.0)
MCV: 96.4 fL (ref 80.0–100.0)
Monocytes Absolute: 1.2 10*3/uL — ABNORMAL HIGH (ref 0.1–1.0)
Monocytes Relative: 9 %
Neutro Abs: 12.4 10*3/uL — ABNORMAL HIGH (ref 1.7–7.7)
Neutrophils Relative %: 84 %
Platelets: 249 10*3/uL (ref 150–400)
RBC: 3.84 MIL/uL — ABNORMAL LOW (ref 4.22–5.81)
RDW: 15 % (ref 11.5–15.5)
WBC: 14.6 10*3/uL — ABNORMAL HIGH (ref 4.0–10.5)
nRBC: 0 % (ref 0.0–0.2)

## 2019-02-17 LAB — ECHOCARDIOGRAM COMPLETE
Height: 65.5 in
Weight: 2183.44 oz

## 2019-02-17 LAB — PH, BODY FLUID: pH, Body Fluid: 7.1

## 2019-02-17 MED ORDER — AMOXICILLIN-POT CLAVULANATE 875-125 MG PO TABS: 1.0000 | ORAL_TABLET | Freq: Two times a day (BID) | ORAL | 0 refills | Status: AC

## 2019-02-17 NOTE — Discharge Summary (Addendum)
Physician Discharge Summary  Luis Townsend ZTI:[id number] DOB: 1928/11/24 DOA: 02/15/2019  PCP: Dorothyann Peng, NP  Admit date: 02/15/2019 Discharge date: 02/17/2019  Admitted From: Home Disposition:  Home  Discharge Condition:Stable CODE STATUS:FULL Diet recommendation: Heart Healthy  Brief/Interim Summary:  Patient is a 86 year old male with history of coronary artery disease status post CABG, status post TAVR, pacemaker placement for complete heart block, COPD who was recently found to have left lung mass with pleural effusion presented to the emergency department for the evaluation of shortness of breath.  He follows with pulmonology, Dr. Melvyn Novas.  He was scheduled to have a thoracentesis done as an outpatient but he presented to the emergency department with worsening dyspnea.  He recently took antibiotics for left lung infiltrates.  No fever, chills on presentation. On presentation, chest x-ray showed worsening left pleural effusion, left lung infiltrate.   Started on antibiotics.  He underwent thoracentesis by IR with removal of 920 mL of fluid.  Cytology pending.  He was seen by physical therapy and recommended home health.  He qualified for home oxygen.  PCCM was following here.  Current plan is to follow-up for PET CT that is scheduled on 02/21/2019 and follow-up with pulmonology as an outpatient.  He is hemodynamically stable for discharge to home today.  Following problems were addressed during his hospitalization:  Acute respiratory failure with hypoxia: Secondary to left pleural effusion, left sided pneumonia and left-sided lung mass.  Qualified for home oxygen.  Started on antibiotics.  Underwent thoracentesis.  Cytology pending.  History of COPD: Not on home oxygen.  Continue home inhalers.  Qualified for oxygen on discharge.  Follow-up with pulmonology.  Left lung mass: Following with Dr. Melvyn Novas.  PET/CT planned for 02/21/2019.  Outpatient follow-up with Dr. Melvyn Novas recommended for further  plan.  Cytology from thoracentesis pending.  History of coronary artery disease: Status post CABG.  On aspirin, beta-blocker, statin.  He is also status post TAVR and carotid stent placement.  History of diastolic heart failure: Currently euvolemic.  On Lasix at home.  Normocytic anemia: Current H&H stable.  Debility/deconditioning: PT recommended home health.  History of chronic heart block: Status post pacemaker.   Discharge Diagnoses:  Principal Problem:   Acute respiratory failure with hypoxia (HCC) Active Problems:   S/P CABG x 5, stenosis to RCA VG 1995   S/P aortic valve replacement   COPD GOLD II vs III   HTN (hypertension)   S/P TAVR (transcatheter aortic valve replacement)   Mass of lower lobe of left lung   Pleural effusion on left   Pleural effusion    Discharge Instructions  Discharge Instructions    Diet - low sodium heart healthy   Complete by: As directed    Discharge instructions   Complete by: As directed    1)Take prescribed medications as instructed. 2)Follow up for PET CT on 02/21/19. 3)Follow up with Dr Melvyn Novas in 1-2 weeks. 4)Follow up with your PCP in a week.Do a CBC test during the follow up.   Increase activity slowly   Complete by: As directed      Allergies as of 02/17/2019   No Known Allergies     Medication List    TAKE these medications   albuterol 108 (90 Base) MCG/ACT inhaler Commonly known as: VENTOLIN HFA INHALE 2 PUFFS BY MOUTH EVERY 4 HOURS AS NEEDED What changed:   how much to take  how to take this  when to take this  reasons to take this  additional instructions   amoxicillin-clavulanate 875-125 MG tablet Commonly known as: Augmentin Take 1 tablet by mouth 2 (two) times daily for 7 days.   aspirin EC 81 MG tablet Take 81 mg by mouth daily.   carvedilol 12.5 MG tablet Commonly known as: COREG Take 1 tablet (12.5 mg total) by mouth 2 (two) times daily.   ezetimibe 10 MG tablet Commonly known as: ZETIA TAKE 1  TABLET BY MOUTH EVERY DAY   furosemide 20 MG tablet Commonly known as: LASIX TAKE 1 TABLET BY MOUTH EVERY DAY   naproxen 500 MG tablet Commonly known as: Naprosyn Take 1 tablet (500 mg total) by mouth 2 (two) times daily with a meal.   rosuvastatin 40 MG tablet Commonly known as: CRESTOR TAKE 1 TABLET BY MOUTH EVERY DAY What changed: when to take this   Trelegy Ellipta 100-62.5-25 MCG/INH Aepb Generic drug: Fluticasone-Umeclidin-Vilant Inhale 1 puff into the lungs daily.            Durable Medical Equipment  (From admission, onward)         Start     Ordered   02/17/19 1228  For home use only DME Walker rolling  Once    Question Answer Comment  Walker: With 5 Inch Wheels   Patient needs a walker to treat with the following condition Unsteady gait      02/17/19 1227         Follow-up Information    [facility] Follow up.   Why: rolling walker Contact information: [place] [address] [phone]        [facility], [facility] Follow up.   Specialty: [place] Why: [facility] physical therapy Contact information: [place] [address] [phone]        Dorothyann Peng, NP. Schedule an appointment as soon as possible for a visit in 1 week(s).   Specialty: Family Medicine Contact information: [place] [address] [phone]        Constance Haw, MD .   Specialty: Cardiology Contact information: [address] [place] [address] [phone]          No Known Allergies  Consultations:  PCCM   Procedures/Studies: DG Chest 1 View  Result Date: 02/16/2019 CLINICAL DATA:  Status post left-sided thoracentesis. EXAM: CHEST  1 VIEW COMPARISON:  February 15, 2019 FINDINGS: The left-sided pleural effusion has decreased from the prior study there is no left-sided pneumothorax. There is persistent masslike consolidation in the left lower  lobe. There is a persistent small left-sided pleural effusion. Bullous changes are noted at the right lung base. Emphysematous changes are noted bilaterally. A sclerotic lesion is noted in the proximal left humerus. Aortic calcifications are noted. The patient is status post prior TAVR. IMPRESSION: 1. Interval decrease in size of the left-sided pleural effusion without evidence for left-sided pneumothorax. 2. Persistent masslike consolidation in the left mid lung zone. Follow-up to radiologic resolution is recommended. 3. Emphysematous changes bilaterally with prominent bolus changes at the right lung base. Electronically Signed   By: Constance Holster M.D.   On: 02/16/2019 16:36   DG Chest 2 View  Result Date: 02/15/2019 CLINICAL DATA:  Shortness of breath. EXAM: CHEST - 2 VIEW COMPARISON:  02/09/2019. FINDINGS: AICD noted in stable position. Prior cardiac valve replacement. Heart size normal. Left mid lung field infiltrate and left-sided pleural effusion again noted. Interim slight increase in left-sided pleural effusion from  prior exam. No pneumothorax. Surgical clips left neck. Degenerative changes thoracic spine multiple stable thoracic vertebral body compression fractures. 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 IMPRESSION: 1. AICD noted stable position. Prior cardiac valve replacement. Heart size normal. 2. Prominent left mid lung field infiltrate and moderate left-sided pleural effusion again noted. Slight increase in left pleural effusion noted from prior exam. Electronically Signed   By: Marcello Moores  Register   On: 02/15/2019 15:49   Leslie's cxr  Result Date: 02/09/2019 CLINICAL DATA:  Increasing shortness of breath for 2 weeks. Pneumonia. EXAM: CHEST - 2 VIEW COMPARISON:  PA and lateral chest 01/19/2019.  CT chest 12/16/2018. FINDINGS: The lungs are severely emphysematous. Left lower lobe mass lesion is again seen. Airspace disease in the left lower lobe has worsened since the most recent  plain films. The right lung is clear. Small left pleural effusion is noted. No pneumothorax. Heart size is normal. Atherosclerosis is seen. Enchondroma proximal left humerus noted. IMPRESSION: Worsened left basilar airspace disease could be due to pneumonia or postobstructive pneumonitis. Left lower lobe mass as seen on prior studies. Severe emphysema. Atherosclerosis. Electronically Signed   By: Inge Rise M.D.   On: 02/09/2019 10:13   Leslie's cxr  Result Date: 01/19/2019 CLINICAL DATA:  COPD. Lung mass. EXAM: CHEST - 2 VIEW COMPARISON:  Chest radiograph and CT 01/05/2019 FINDINGS: Sequelae of CABG and TAVR are again identified. A pacemaker remains in place. The cardiomediastinal silhouette is unchanged with normal heart size. There is a persistent 6 cm masslike opacity centered in the superior segment of the left lower lobe, similar to the prior studies. Patchy opacities persist more inferiorly in the left lower lobe as well. There is a small persistent left pleural effusion. There is a background of lung hyperinflation with emphysema. No pneumothorax is identified. A chronic, benign-appearing lesion is again seen in the proximal left humerus. There are chronic compression fractures in the mid and lower thoracic spine. IMPRESSION: 1. Persistent 6 cm superior segment left lower lobe masslike opacity which remains concerning for malignancy. 2. Persistent patchy left lower lobe infiltrates more inferiorly with a small left pleural effusion. 3. Emphysema. Electronically Signed   By: Logan Bores M.D.   On: 01/19/2019 10:39   CT CHEST WO CONTRAST  Result Date: 02/16/2019 CLINICAL DATA:  Inpatient. Hypoxemia. Follow-up masslike consolidation in the left lower lobe. EXAM: CT CHEST WITHOUT CONTRAST TECHNIQUE: Multidetector CT imaging of the chest was performed following the standard protocol without IV contrast. COMPARISON:  01/05/2019 chest CT. Chest radiograph from earlier today. FINDINGS: Cardiovascular:  Normal heart size. No significant pericardial effusion/thickening. Aortic valve prosthesis in place. Three-vessel coronary atherosclerosis status post CABG. Three lead left subclavian pacemaker noted with lead tips in the right atrium, right ventricular apex and coronary sinus. Atherosclerotic nonaneurysmal thoracic aorta. Normal caliber pulmonary arteries. Mediastinum/Nodes: No discrete thyroid nodules. Unremarkable esophagus. No axillary adenopathy. Newly mildly enlarged 1.0 cm right supraclavicular node (series 2/image 1). Enlarged 1.6 cm subcarinal node (series 2/image 73), increased from 1.1 cm on 01/05/2019 chest CT using similar measurement technique. Stable enlarged 1.1 cm left paratracheal node (series 2/image 57). Multiple new enlarged right paratracheal nodes up to 1.1 cm (series 2/image 34). Enlarged 1.5 cm AP window node (series 2/image 55), stable. Left hilar adenopathy, poorly delineated on this noncontrast scan. No discrete right hilar adenopathy on this noncontrast scan. Lungs/Pleura: No pneumothorax. Small dependent left pleural effusion, similar. No right pleural effusion. Moderate centrilobular and paraseptal emphysema. Peripheral left lower lobe irregular  solid 7.0 x 4.7 cm lung mass (series 4/image 74), previously 6.5 x 4.6 cm on 01/05/2019 using similar measurement technique, mildly increased. Medial left lower lobe 5.2 x 3.5 cm lung mass (series 4/image 115), previously 5.1 x 2.9 cm, mildly increased. Increased irregular septal thickening throughout the left lower lobe and lingula. Lingular solid 7 mm pulmonary nodule (series 4/image 103), increased from 5 mm. Several (greater than 5) new small solid pulmonary nodules scattered in the right lung, largest 4 mm in the right middle lobe (series 4/image 94). Upper abdomen: Simple 1.4 cm lower right renal cyst. Colonic diverticulosis. Musculoskeletal: No aggressive appearing focal osseous lesions. Intact sternotomy wires. Chronic moderate T7 and  T12 vertebral compression fractures. Moderate thoracic spondylosis. IMPRESSION: 1. Large 7.0 cm peripheral and 5.2 cm medial left lower lobe lung masses, both mildly increased since 01/05/2019 chest CT, highly suspicious for primary bronchogenic carcinoma. 2. Increased irregular septal thickening throughout the left lower lobe and lingula, suspicious for lymphangitic carcinomatosis. 3. Similar small dependent left pleural effusion. 4. Enlarging subcentimeter lingular pulmonary nodule. Several new subcentimeter right pulmonary nodules. Findings suspicious for pulmonary metastases. 5. Left hilar, subcarinal, AP window, bilateral paratracheal and right supraclavicular lymphadenopathy has progressed, suspicious for metastatic disease. Aortic Atherosclerosis (ICD10-I70.0) and Emphysema (ICD10-J43.9). Electronically Signed   By: Ilona Sorrel M.D.   On: 02/16/2019 20:13   CT ABDOMEN PELVIS W CONTRAST  Result Date: 02/01/2019 CLINICAL DATA:  Abdominal pain EXAM: CT ABDOMEN AND PELVIS WITH CONTRAST TECHNIQUE: Multidetector CT imaging of the abdomen and pelvis was performed using the standard protocol following bolus administration of intravenous contrast. CONTRAST:  166mL ISOVUE-300 IOPAMIDOL (ISOVUE-300) INJECTION 61% COMPARISON:  CT from 2013, angiography evaluation FINDINGS: Lower chest: Signs of marked pulmonary emphysema. Dense consolidation at the left lung base associated with patchy ground-glass adjacent areas of consolidation, left effusion and with small 7 mm lingular nodule. Right lung is clear with severe emphysematous changes. Leads from pacer device present in right heart and coronary sinus. Hepatobiliary: Liver is normal aside from focal fatty infiltration about the fissure. Gallbladder is unremarkable. No sign of biliary ductal dilation. Pancreas: Pancreas is normal. Spleen: Spleen is normal. Adrenals/Urinary Tract: Adrenal glands are normal. Small right renal cyst and small interpolar cyst on the left  with renal sinus cysts also noted on the left. No signs of hydronephrosis or perinephric stranding. Stomach/Bowel: Stomach is distended. Duodenum is drawn into soft tissue that is present about the celiac axis and retroperitoneum. No signs of bowel obstruction. See below for more detail regarding this process. Enteric contrast passes into the terminal ileum. The colon is stool filled and there is extensive colonic diverticulosis in the sigmoid colon. Vascular/Lymphatic: Patent abdominal vasculature though there is narrowing of the left renal vein secondary to ill-defined soft tissue which also tethers the first portion of the duodenum (image 24, series 2) soft tissue just below the left renal vein as it passes over the aorta measures 2.0 x 2.2 cm (image 25, series 2. Left para-aortic nodal enlargement. (Image 29, series 2 14 mm left periaortic node. Mild stranding in the sigmoid mesentery at the central portion. No discrete nodal disease in this location. Small lymph node in the upper abdomen with necrotic appearance (image 16, series 2 anterior to right diaphragmatic crura) 9 mm. Moderate to marked generalized atherosclerotic change with severe atherosclerosis of bilateral iliac vessels. No signs of pelvic lymphadenopathy. Reproductive: Is prostate is enlarged and heterogeneous. Other: No signs of free air. No ascites. Musculoskeletal: No signs of  acute bone finding or destructive bone process. Spinal degenerative changes. Compression fracture T12 appears chronic. Scratch this IMPRESSION: 1. Findings suspicious for left lower lobe pneumonia. 2. Small nodule also seen in the lingula. Consider follow-up in 8-12 weeks after resolution of symptoms to ensure resolution of this process and assess for new baseline, particularly with attention to small nodule in this area. 3. Ill-defined retroperitoneal and upper abdominal soft tissue associated with nodal enlargement is nonspecific. There is evidence of gastric thickening  versus peristalsis. Findings could be seen in upper abdominal or gastric malignancy. Given left periaortic adenopathy the possibility of testicular neoplasm is also entertained. Lymphoma or treated neoplasm could also explain some of these findings and this should be correlated with history. Many of these areas appear more suspicious however for nontreated/new disease particularly along the left periaortic chain and anterior to the right diaphragmatic crus. 4. Encasement of left renal vein in tethering of duodenum secondary to this process could be a cause for abdominal pain. 5. Enlarged, heterogeneous prostate. 6. Diverticulosis without evidence of acute diverticulitis. 7. Emphysema and aortic atherosclerosis. Aortic Atherosclerosis (ICD10-I70.0) and Emphysema (ICD10-J43.9). Electronically Signed   By: Zetta Bills M.D.   On: 02/01/2019 15:47   ECHOCARDIOGRAM COMPLETE  Result Date: 02/17/2019   ECHOCARDIOGRAM REPORT   Patient Name:   Luis Townsend [facility] Date of Exam: 02/17/2019 Medical Rec #:  [id number]      Height:       65.5 in Accession #:    [id number]     Weight:       136.5 lb Date of Birth:  07/13/1928      BSA:          1.69 m Patient Age:    89 years       BP:           133/60 mmHg Patient Gender: M              HR:           70 bpm. Exam Location:  Inpatient Procedure: 2D Echo Indications:    abnormal ECG 794.31  History:        Patient has prior history of Echocardiogram examinations, most                 recent 07/29/2017. CAD, Prior CABG and Pacemaker; COPD. Aortic                 Valve: A 26 Edwards Edwards Sapien bioprosthetic, stented aortic                 valve (TAVR) Procedure Date: 2013 TAVR. tobacco abuse. CHB.  Sonographer:    Jannett Celestine RDCS (AE) Referring Phys: [id number] Julian Hy  Sonographer Comments: limited mobility. exam performed supine. IMPRESSIONS  1. Left ventricular ejection fraction, by visual estimation, is 55 to 60%. The left ventricle has normal function. There is no left  ventricular hypertrophy.  2. Basal and mid inferior wall is abnormal.  3. Left ventricular diastolic parameters are indeterminate.  4. The left ventricle demonstrates regional wall motion abnormalities.  5. Global right ventricle has mildly reduced systolic function.The right ventricular size is normal. No increase in right ventricular wall thickness.  6. Left atrial size was normal.  7. Right atrial size was normal.  8. Presence of pericardial fat pad.  9. Small pericardial effusion. 10. The pericardial effusion is circumferential. 11. The mitral valve is normal in structure. Trivial mitral valve regurgitation. 12. The tricuspid valve  is normal in structure. 13. Aortic valve regurgitation is trivial. 14. The pulmonic valve was not well visualized. Pulmonic valve regurgitation is not visualized. 15. TR signal is inadequate for assessing pulmonary artery systolic pressure. 16. A pacer wire is visualized in the RA and RV. 17. The inferior vena cava is dilated in size with >50% respiratory variability, suggesting right atrial pressure of 8 mmHg. FINDINGS  Left Ventricle: Left ventricular ejection fraction, by visual estimation, is 55 to 60%. The left ventricle has normal function. The left ventricle demonstrates regional wall motion abnormalities. There is no left ventricular hypertrophy. Left ventricular diastolic parameters are indeterminate.  LV Wall Scoring: The basal and mid inferior wall is hypokinetic. All remaining scored segments are normal. Right Ventricle: The right ventricular size is normal. No increase in right ventricular wall thickness. Global RV systolic function is has mildly reduced systolic function. Left Atrium: Left atrial size was normal in size. Right Atrium: Right atrial size was normal in size Pericardium: A small pericardial effusion is present. The pericardial effusion is circumferential. Presence of pericardial fat pad. Mitral Valve: The mitral valve is normal in structure. There is mild  thickening of the mitral valve leaflet(s). There is mild calcification of the mitral valve leaflet(s). Trivial mitral valve regurgitation. Tricuspid Valve: The tricuspid valve is normal in structure. Tricuspid valve regurgitation is trivial. Aortic Valve: The aortic valve has been repaired/replaced. Aortic valve regurgitation is trivial. 26 Edwards Edwards Sapien bioprosthetic, stented aortic valve (TAVR) valve is present in the aortic position. Procedure Date: 2013 Echo findings show normal  structure and function of the aortic prosthesis. Pulmonic Valve: The pulmonic valve was not well visualized. Pulmonic valve regurgitation is not visualized. Pulmonic regurgitation is not visualized. Aorta: The aortic root is normal in size and structure. Pulmonary Artery: The pulmonary artery is not well seen. Venous: The inferior vena cava is dilated in size with greater than 50% respiratory variability, suggesting right atrial pressure of 8 mmHg. IAS/Shunts: No atrial level shunt detected by color flow Doppler. Additional Comments: A pacer wire is visualized in the right atrium and right ventricle.  LEFT VENTRICLE PLAX 2D LVIDd:         3.60 cm  Diastology LVIDs:         2.40 cm  LV e' lateral:   4.68 cm/s LV PW:         1.10 cm  LV E/e' lateral: 15.6 LV IVS:        1.00 cm  LV e' medial:    5.77 cm/s LVOT diam:     2.10 cm  LV E/e' medial:  12.6 LV SV:         34 ml LV SV Index:   20.23 LVOT Area:     3.46 cm  RIGHT VENTRICLE RV S prime:     10.90 cm/s TAPSE (M-mode): 1.4 cm LEFT ATRIUM             Index LA diam:        3.40 cm 2.01 cm/m LA Vol (A2C):   20.9 ml 12.36 ml/m LA Vol (A4C):   24.1 ml 14.25 ml/m LA Biplane Vol: 22.6 ml 13.36 ml/m  AORTIC VALVE LVOT Vmax:   63.40 cm/s LVOT Vmean:  43.700 cm/s LVOT VTI:    0.130 m  AORTA Ao Root diam: 2.70 cm MITRAL VALVE MV Area (PHT): 2.56 cm              SHUNTS MV PHT:  85.84 msec            Systemic VTI:  0.13 m MV Decel Time: 296 msec              Systemic Diam:  2.10 cm MV E velocity: 72.80 cm/s  103 cm/s MV A velocity: 127.00 cm/s 70.3 cm/s MV E/A ratio:  0.57        1.5  Buford Dresser MD Electronically signed by Buford Dresser MD Signature Date/Time: 02/17/2019/1:33:53 PM    Final    IR THORACENTESIS ASP PLEURAL SPACE W/IMG GUIDE  Result Date: 02/16/2019 INDICATION: Patient with history of masslike consolidation in left lower lobe of lung, left pleural effusion, COPD, dyspnea. Request made for diagnostic and therapeutic left thoracentesis. EXAM: ULTRASOUND GUIDED DIAGNOSTIC AND THERAPEUTIC LEFT THORACENTESIS MEDICATIONS: None COMPLICATIONS: None immediate. PROCEDURE: An ultrasound guided thoracentesis was thoroughly discussed with the patient and questions answered. The benefits, risks, alternatives and complications were also discussed. The patient understands and wishes to proceed with the procedure. Written consent was obtained. Ultrasound was performed to localize and mark an adequate pocket of fluid in the left chest. The area was then prepped and draped in the normal sterile fashion. 1% Lidocaine was used for local anesthesia. Under ultrasound guidance a 6 Fr Safe-T-Centesis catheter was introduced. Thoracentesis was performed. The catheter was removed and a dressing applied. FINDINGS: A total of approximately 920 cc of hazy, amber fluid was removed. Samples were sent to the laboratory as requested by the clinical team. IMPRESSION: Successful ultrasound guided diagnostic and therapeutic left thoracentesis yielding 920 cc of pleural fluid. Read by: Rowe Robert, PA-C Electronically Signed   By: Corrie Mckusick D.O.   On: 02/16/2019 16:25       Subjective: Patient seen and examined the bedside this morning.  Hemodynamically stable for discharge today.  Discharge Exam: Vitals:   02/17/19 1222 02/17/19 1334  BP: 113/60   Pulse: 71   Resp: 18   Temp: 98 F (36.7 C)   SpO2: 98% 97%   Vitals:   02/17/19 0753 02/17/19 0957 02/17/19 1222  02/17/19 1334  BP:  132/68 113/60   Pulse:  73 71   Resp:   18   Temp:   98 F (36.7 C)   TempSrc:   Oral   SpO2: 98%  98% 97%  Weight:      Height:        General: Pt is alert, awake, not in acute distress Cardiovascular: RRR, S1/S2 +, no rubs, no gallops Respiratory: CTA bilaterally, no wheezing, no rhonchi Abdominal: Soft, NT, ND, bowel sounds + Extremities: no edema, no cyanosis    The results of significant diagnostics from this hospitalization (including imaging, microbiology, ancillary and laboratory) are listed below for reference.     Microbiology: Recent Results (from the past 240 hour(s))  Novel Coronavirus, NAA (Hosp order, Send-out to Ref Lab; TAT 18-24 hrs     Status: None   Collection Time: 02/14/19 11:47 AM   Specimen: Nasopharyngeal Swab; Respiratory  Result Value Ref Range Status   SARS-CoV-2, NAA NOT DETECTED NOT DETECTED Final    Comment: (NOTE) This nucleic acid amplification test was developed and its performance characteristics determined by [employer]. Nucleic acid amplification tests include PCR and TMA. This test has not been FDA cleared or approved. This test has been authorized by FDA under an Emergency Use Authorization (EUA). This test is only authorized for the duration of time the declaration that circumstances exist justifying the authorization of  the emergency use of in vitro diagnostic tests for detection of SARS-CoV-2 virus and/or diagnosis of COVID-19 infection under section 564(b)(1) of the Act, 21 U.S.C. 010XNA-3(F) (1), unless the authorization is terminated or revoked sooner. When diagnostic testing is negative, the possibility of a false negative result should be considered in the context of a patient's recent exposures and the presence of clinical signs and symptoms consistent with COVID-19. An individual without symptoms of COVID- 19 and who is not shedding SARS-CoV-2 vi rus would expect to have a negative (not  detected) result in this assay. Performed At: [facility] [address] [place], [place] [id number] Rush Farmer MD YH:[id number]    [place]  Final    Comment: Performed at [facility], [place] [address] [place], [place] [address]  SARS CORONAVIRUS 2 (TAT 6-24 HRS) Nasopharyngeal Nasopharyngeal Swab     Status: None   Collection Time: 02/15/19 10:22 PM   Specimen: Nasopharyngeal Swab  Result Value Ref Range Status   SARS Coronavirus 2 NEGATIVE NEGATIVE Final    Comment: (NOTE) SARS-CoV-2 target nucleic acids are NOT DETECTED. The SARS-CoV-2 RNA is generally detectable in upper and lower respiratory specimens during the acute phase of infection. Negative results do not preclude SARS-CoV-2 infection, do not rule out co-infections with other pathogens, and should not be used as the sole basis for treatment or other patient management decisions. Negative results must be combined with clinical observations, patient history, and epidemiological information. The expected result is Negative. Fact Sheet for Patients: SugarRoll.be Fact Sheet for Healthcare Providers: https://www.woods-mathews.com/ This test is not yet approved or cleared by the [place] FDA and  has been authorized for detection and/or diagnosis of SARS-CoV-2 by FDA under an Emergency Use Authorization (EUA). This EUA will remain  in effect (meaning this test can be used) for the duration of the COVID-19 declaration under Section 56 4(b)(1) of the Act, 21 U.S.C. section 360bbb-3(b)(1), unless the authorization is terminated or revoked sooner. Performed at [facility], [place] [address] [place], [place] [address]   Body fluid culture     Status: None (Preliminary result)   Collection Time: 02/16/19  4:26 PM   Specimen: Pleura; Body Fluid  Result Value Ref Range Status   Specimen Description   Final    PLEURAL Performed at  [place] [address] [place], [place] [address]    Special Requests   Final    NONE Performed at [facility], [place] [address] [place], [place] [address]    Gram Stain   Final    MODERATE WBC PRESENT, PREDOMINANTLY MONONUCLEAR NO ORGANISMS SEEN    Culture   Final    NO GROWTH < 24 HOURS Performed at [facility] [address], [place], [place] [address]    Report Status PENDING  Incomplete     Labs: BNP (last 3 results) Recent Labs    01/05/19 0237 02/15/19 1924  BNP 103.0* 500.9*   Basic Metabolic Panel: Recent Labs  Lab 02/15/19 1923 02/16/19 0457  NA 139 140  K 4.6 4.7  CL 103 104  CO2 26 25  GLUCOSE 121* 94  BUN 34* 36*  CREATININE 1.08 0.96  CALCIUM 8.8* 8.4*   Liver Function Tests: Recent Labs  Lab 02/15/19 1923  AST 85*  ALT 99*  ALKPHOS 153*  BILITOT 0.8  PROT 6.8  ALBUMIN 2.6*   No results for input(s): LIPASE, AMYLASE in the last 168  hours. No results for input(s): AMMONIA in the last 168 hours. CBC: Recent Labs  Lab 02/15/19 1923 02/16/19 0457 02/17/19 0435  WBC 12.6* 12.5* 14.6*  NEUTROABS 10.2*  --  12.4*  HGB 12.4* 11.7* 11.7*  HCT 38.7* 36.4* 37.0*  MCV 97.0 95.5 96.4  PLT 238 227 249   Cardiac Enzymes: No results for input(s): CKTOTAL, CKMB, CKMBINDEX, TROPONINI in the last 168 hours. BNP: Invalid input(s): POCBNP CBG: No results for input(s): GLUCAP in the last 168 hours. D-Dimer No results for input(s): DDIMER in the last 72 hours. Hgb A1c No results for input(s): HGBA1C in the last 72 hours. Lipid Profile No results for input(s): CHOL, HDL, LDLCALC, TRIG, CHOLHDL, LDLDIRECT in the last 72 hours. Thyroid function studies No results for input(s): TSH, T4TOTAL, T3FREE, THYROIDAB in the last 72 hours.  Invalid input(s): FREET3 Anemia work up No results for input(s): VITAMINB12, FOLATE, FERRITIN, TIBC, IRON, RETICCTPCT in the last 72 hours. Urinalysis No results  found for: COLORURINE, APPEARANCEUR, [place], [place], [place], [place], [place], [place], PROTEINUR, UROBILINOGEN, NITRITE, LEUKOCYTESUR Sepsis Labs Invalid input(s): PROCALCITONIN,  WBC,  LACTICIDVEN Microbiology Recent Results (from the past 240 hour(s))  Novel Coronavirus, NAA (Hosp order, Send-out to Ref Lab; TAT 18-24 hrs     Status: None   Collection Time: 02/14/19 11:47 AM   Specimen: Nasopharyngeal Swab; Respiratory  Result Value Ref Range Status   SARS-CoV-2, NAA NOT DETECTED NOT DETECTED Final    Comment: (NOTE) This nucleic acid amplification test was developed and its performance characteristics determined by [employer]. Nucleic acid amplification tests include PCR and TMA. This test has not been FDA cleared or approved. This test has been authorized by FDA under an Emergency Use Authorization (EUA). This test is only authorized for the duration of time the declaration that circumstances exist justifying the authorization of the emergency use of in vitro diagnostic tests for detection of SARS-CoV-2 virus and/or diagnosis of COVID-19 infection under section 564(b)(1) of the Act, 21 U.S.C. 973ZHG-9(J) (1), unless the authorization is terminated or revoked sooner. When diagnostic testing is negative, the possibility of a false negative result should be considered in the context of a patient's recent exposures and the presence of clinical signs and symptoms consistent with COVID-19. An individual without symptoms of COVID- 19 and who is not shedding SARS-CoV-2 vi rus would expect to have a negative (not detected) result in this assay. Performed At: [facility] [address] [place], [place] [id number] Rush Farmer MD QQ:[id number]    [place]  Final    Comment: Performed at [facility], [place] [address] [place], [place] [address]  SARS CORONAVIRUS 2 (TAT 6-24 HRS) Nasopharyngeal Nasopharyngeal Swab     Status: None    Collection Time: 02/15/19 10:22 PM   Specimen: Nasopharyngeal Swab  Result Value Ref Range Status   SARS Coronavirus 2 NEGATIVE NEGATIVE Final    Comment: (NOTE) SARS-CoV-2 target nucleic acids are NOT DETECTED. The SARS-CoV-2 RNA is generally detectable in upper and lower respiratory specimens during the acute phase of infection. Negative results do not preclude SARS-CoV-2 infection, do not rule out co-infections with other pathogens, and should not be used as the sole basis for treatment or other patient management decisions. Negative results must be combined with clinical observations, patient history, and epidemiological information. The expected result is Negative. Fact Sheet for Patients: SugarRoll.be Fact Sheet for Healthcare Providers: https://www.woods-mathews.com/ This test is not yet approved or cleared by the [place] FDA and  has been authorized for detection and/or diagnosis of SARS-CoV-2 by FDA under an Emergency Use Authorization (EUA). This EUA will remain  in effect (meaning this test can be used) for the duration of the COVID-19 declaration under Section 56 4(b)(1) of the Act, 21 U.S.C. section 360bbb-3(b)(1), unless the authorization is terminated or revoked sooner. Performed at [facility], [place] [address] [place], [place] [address]   Body fluid culture     Status: None (Preliminary result)   Collection Time: 02/16/19  4:26 PM   Specimen: Pleura; Body Fluid  Result Value Ref Range Status   Specimen Description   Final    PLEURAL Performed at [facility] [address] [place], [place] [address]    Special Requests   Final    NONE Performed at [facility], [place] [address] [place], [place] [address]    Gram Stain   Final    MODERATE WBC PRESENT, PREDOMINANTLY MONONUCLEAR NO ORGANISMS SEEN    Culture   Final    NO GROWTH < 24 HOURS Performed at [facility] [address] [place], [place] [address]    Report Status PENDING  Incomplete    Please note: You were cared for by a hospitalist during your hospital stay. Once you are discharged, your primary care physician will handle any further medical issues. Please note that NO REFILLS for any discharge medications will be authorized once you are discharged, as it is imperative that you return to your primary care physician (or establish a relationship with a primary care physician if you do not have one) for your post hospital discharge needs so that they can reassess your need for medications and monitor your lab values.    Time coordinating discharge: 40 minutes  SIGNED:   Shelly Coss, MD  [facility] 02/17/2019, 3:41 PM Pager [phone]  If 7PM-7AM, please contact night-coverage [email or web address] Password [email or web address]

## 2019-02-17 NOTE — Progress Notes (Signed)
Physical Therapy Treatment Patient Details Name: Luis Townsend MRN: [id number] DOB: 1928/07/10 Today's Date: 02/17/2019    History of Present Illness Patient is a 86 year old male with history of coronary disease status post CABG, status post TAVR, pacemaker placement for complete heart block, COPD who was recently found to have left lung mass with pleural effusion presented to the emergency department for the evaluation of shortness of breath.  He follows with pulmonology, Dr. Melvyn Novas.  He was scheduled to have a thoracentesis done as an outpatient but he presented to the emergency department with worsening dyspnea.  He recently took antibiotics for left lung infiltrates.  No fever, chills on presentation.    PT Comments    Patient progressing gradually with therapy and able to increase distance for gait today.  He required ~ 3 standing rest breaks with cues for pursed lip breathing throughout. SpO2 remained at 95% or greater during gait on 2L/min via nasal canula. Pt instructed in LE strengthening exercises and provided handout for sit<>stands and LAQ. Acute PT will progress as able.   Follow Up Recommendations  Home health PT     Equipment Recommendations  Rolling walker with 5" wheels    Recommendations for Other Services       Precautions / Restrictions Precautions Precautions: Fall Restrictions Weight Bearing Restrictions: No    Mobility  Bed Mobility Overal bed mobility: Needs Assistance Bed Mobility: Supine to Sit     Supine to sit: HOB elevated;Min guard     General bed mobility comments: pt requires extra time and use of bed rails, no assist required  Transfers Overall transfer level: Needs assistance Equipment used: Rolling walker (2 wheeled) Transfers: Sit to/from [facility] Sit to Stand: Min guard Stand pivot transfers: Min guard       General transfer comment: min guard with cues for hand placement and technique with RW, no assist required  for power up. verbal cues for safe step pattern in RW for stand step from bed to chair.  Ambulation/Gait   Gait Distance (Feet): 80 Feet(3 standing rest breaks) Assistive device: Rolling walker (2 wheeled) Gait Pattern/deviations: Decreased stride length;Trunk flexed;Shuffle Gait velocity: decreased   General Gait Details: pt required cues for safe hand placement and step pattern on RW. pt remainedon 2L/min wtih O2. (SpO2: at rest/start of session = 94-98%, with gait = 95%, at EOS = 97%). Cues for pursed lip breathing technique during standign rest breaks to reduce SOB. Pt reported SOB throughout entire session with dyspnea of 2-3/4 at rest and with activity. pt denied major increase in SOB with activity.   Stairs             Wheelchair Mobility    Modified Rankin (Stroke Patients Only)       Balance Overall balance assessment: Needs assistance Sitting-balance support: Feet supported Sitting balance-Leahy Scale: Good     Standing balance support: During functional activity;Bilateral upper extremity supported Standing balance-Leahy Scale: Fair               Cognition Arousal/Alertness: Awake/alert Behavior During Therapy: WFL for tasks assessed/performed Overall Cognitive Status: Within Functional Limits for tasks assessed           Exercises General Exercises - Lower Extremity Long Arc Quad: AROM;Seated;Both;10 reps Other Exercises Other Exercises: Educated on pursed lip breathing throughout gait and during seated rest. Other Exercises: Repeated Sit<>Stands: 2x 5 reps with bil UE use for power up    General Comments  Pertinent Vitals/Pain Pain Assessment: No/denies pain           PT Goals (current goals can now be found in the care plan section) Acute Rehab PT Goals Patient Stated Goal: to be able to breathe better and have more energy PT Goal Formulation: With patient Time For Goal Achievement: 03/02/19 Progress towards PT goals:  Progressing toward goals    Frequency    Min 3X/week      PT Plan Current plan remains appropriate       AM-PAC PT "6 Clicks" Mobility   Outcome Measure  Help needed turning from your back to your side while in a flat bed without using bedrails?: A Little Help needed moving from lying on your back to sitting on the side of a flat bed without using bedrails?: A Little Help needed moving to and from a bed to a chair (including a wheelchair)?: A Little Help needed standing up from a chair using your arms (e.g., wheelchair or bedside chair)?: A Little Help needed to walk in hospital room?: A Little Help needed climbing 3-5 steps with a railing? : A Little 6 Click Score: 18    End of Session Equipment Utilized During Treatment: Gait belt Activity Tolerance: Patient limited by fatigue Patient left: with call bell/phone within reach;in chair;with chair alarm set Nurse Communication: Mobility status PT Visit Diagnosis: Muscle weakness (generalized) (M62.81);Difficulty in walking, not elsewhere classified (R26.2);Unsteadiness on feet (R26.81)     Time: 1761-6073 PT Time Calculation (min) (ACUTE ONLY): 28 min  Charges:  $Gait Training: 8-22 mins $Therapeutic Exercise: 8-22 mins                     Verner Mould, DPT Physical Therapist with [facility] [phone]  02/17/2019 12:09 PM

## 2019-02-17 NOTE — TOC Transition Note (Signed)
Transition of Care [facility]) - CM/SW Discharge Note   Patient Details  Name: Luis Townsend MRN: [id number] Date of Birth: 02/22/1928  Transition of Care [facility]) CM/SW Contact:  Dessa Phi, RN Phone Number: 02/17/2019, 12:30 PM   Clinical Narrative:  D/c home w/[facility] rep [name] aware.HHPT, rw-[facility] rep Zach to deliver to rm prior d/c.     Final next level of care: Post Barriers to Discharge: No Barriers Identified   Patient Goals and CMS Choice Patient states their goals for this hospitalization and ongoing recovery are:: go home CMS Medicare.gov Compare Post Acute Care list provided to:: Patient Represenative (must comment) Choice offered to / list presented to : Adult Children  Discharge Placement                       Discharge Plan and Services                DME Arranged: Walker rolling DME Agency: [facility] Date DME Agency Contacted: 02/17/19 Time DME Agency Contacted: 1229 Representative spoke with at DME Agency: [place]: PT [place]: [place] Date [place]: 02/17/19 Time [place]: 1229 Representative spoke with at [place]: cory  Social Determinants of Health ([place]) Interventions     Readmission Risk Interventions No flowsheet data found.

## 2019-02-17 NOTE — Consult Note (Signed)
NAME:  Luis Townsend, MRN:  [id number], DOB:  Apr 12, 1928, LOS: 1 ADMISSION DATE:  02/15/2019, CONSULTATION DATE:  02/15/2018 REFERRING MD:  Tawanna Solo, CHIEF COMPLAINT:  SOB, pleural effusion   Brief History   Lung mass, enlarging pleural effusion, progressive SOB  History of present illness   Luis Townsend is an 86 y/o gentleman admitted for acute hypoxic respiratory failure attributed to a pleural effusion.  He has been monitored for this pleural effusion as an outpatient, and had an outpatient thoracentesis scheduled.  He was not able to wait for this due to progressive shortness of bre.  In the ED he was saturating 90% on 2 L.  He has never had a previous thoracentesis.  He has had progressive dyspnea on exertion for the past month.  At baseline he can walk about 30 feet, but recently can only walk about 10 to 12 feet.  He has had increased sputum production but denies hemoptysis.  He has had some mild foot and ankle edema recently.  He admits to weight loss and reduced p.o. intake recently.  He is not sure if he has taken antibiotics recently, but per chart review he was prescribed Augmentin on 11/25 and again on 12/9.  His history is limited by his memory.  He asked me multiple times throughout the encounter who I was.  Former 60-pack-year smoking history per chart review.  Past Medical History  COPD CAD s/p CABG x 5v in 1996 Complete heart block s/p pacemaker in 2014 Severe aortic stenosis s/p TAVR  Significant Hospital Events   [place] 1/6  Consults:  Pulmonlogy  Procedures:  [place] 02/16/19  Significant Diagnostic Tests:  2018 echo: LVEF 50 to 46%, grade 1 diastolic dysfunction.  TAVR.  CXR 1/6: left sided dependent effusion, LLL opacity  Ct chest 11/25- left dependent pleural effusion, LLL supererior segment large peripheral opacity without air bronchograms, LLL peripheral and medial pleural based opacities with hypodense center, mediastinal adenopathy  CT abd/ pelvis: persistent  effusion, smaller medial LLL lung opacity. Unable to assess mediastinum or superior segment of LLL.   CT chest-mediastinal adenopathy, superior segment left lower lobe mass persists, reduced size left pleural effusion.  Persistent airway disease in the left lower lobe with peripheral opacities.  Few small randomly scattered nodules in the right lung.  Patulous esophagus.  Micro Data:  SARS-CoV-2 negative Pleural fluid 1/6>>  Antimicrobials:  Ceftriaxone Azithromycin  Interim history/subjective:  No significant change in breathing.  No new complaints.  Objective   Blood pressure 113/60, pulse 71, temperature 98 F (36.7 C), temperature source Oral, resp. rate 18, height 5' 5.5" (1.664 m), weight 61.9 kg, SpO2 97 %.        Intake/Output Summary (Last 24 hours) at 02/17/2019 1637 Last data filed at 02/17/2019 0600 Gross per 24 hour  Intake 230 ml  Output 250 ml  Net -20 ml   Filed Weights   02/15/19 1526  Weight: 61.9 kg    Examination: General: Frail appearing elderly man lying in bed in no acute distress HENT: [place]/AT, temporal wasting, eyes anicteric.  Oral mucosa moist. Lungs: Breathing comfortably on 2 L nasal cannula.  No conversational dyspnea.  Tachypnea improved.  Mild rales on the left. Cardiovascular: Regular rate and rhythm, no murmurs.  Pacemaker protruding from the left side of his chest Abdomen: Soft, nontender, nondistended Extremities: No clubbing or cyanosis Neuro: Answering questions appropriately, questionable historian.  Globally weak. Derm: No rashes or ecchymoses  Resolved Hospital Problem list  Assessment & Plan:  Acute hypoxic respiratory failure- due to pleural effusion and significant left-sided airspace disease.  He has a left lower lobe lung mass that is enlarging and very suspicious for cancer.  It is unclear if the diffuse airspace disease in the left lower lobe is lymphangitic spread versus persistent aspiration or infection related changes.   New nodules in the right lung are concerning for mets. -I discussed the results with thoracentesis and CT scan with Mr. Luis Townsend wife, daughter Manuela Schwartz, and son over the phone.  They understand that the effusion seems most likely related to her recent pneumonia, but pathology is pending and may still show cancer cells.  If it is a malignant pleural effusion, this would make it stage IV, but would obviate the need for additional diagnostic procedures.  They would like to plan for discharge home and outpatient PET scan Monday, and when the PET scan results and pathology results are available make a decision regarding future procedures are needed.  They understand my concern that with his frailty and memory impairment that he is not likely to tolerate aggressive chemotherapy, and decisions regarding aggressiveness of procedures should be weighed against their relative risk given his frailty. -Recommend completion of 7 days of antibiotics -We will be able to follow-up on pleural effusion to monitor for reaccumulation on PET/CT scan next week -Titrate down supplemental oxygen as able.  He needs to be walked to determine if he needs oxygen at discharge.   -Pleural fluid pathology and cultures are pending.  COPD -Continue PTA inhaler regimen at discharge  Leg edema, h/o CABG & TAVR.  LV systolic function on echocardiogram. -Resume PTA cardiac meds  Debility -Consider home health PT and OT   I discussed the plan with Dr. Tawanna Solo.  Dr. Melvyn Novas has been notified of his pending pathology, cultures, and the plan for pursuing his outpatient PET scan next week.  He should follow-up with Dr. Melvyn Novas after his PET.   Labs   CBC: Recent Labs  Lab 02/15/19 1923 02/16/19 0457 02/17/19 0435  WBC 12.6* 12.5* 14.6*  NEUTROABS 10.2*  --  12.4*  HGB 12.4* 11.7* 11.7*  HCT 38.7* 36.4* 37.0*  MCV 97.0 95.5 96.4  PLT 238 227 782    Basic Metabolic Panel: Recent Labs  Lab 02/15/19 1923 02/16/19 0457  NA 139 140   K 4.6 4.7  CL 103 104  CO2 26 25  GLUCOSE 121* 94  BUN 34* 36*  CREATININE 1.08 0.96  CALCIUM 8.8* 8.4*   GFR: Estimated Creatinine Clearance: 46.6 mL/min (by C-G formula based on SCr of 0.96 mg/dL). Recent Labs  Lab 02/15/19 1923 02/16/19 0457 02/17/19 0435  WBC 12.6* 12.5* 14.6*    Liver Function Tests: Recent Labs  Lab 02/15/19 1923  AST 85*  ALT 99*  ALKPHOS 153*  BILITOT 0.8  PROT 6.8  ALBUMIN 2.6*   No results for input(s): LIPASE, AMYLASE in the last 168 hours. No results for input(s): AMMONIA in the last 168 hours.  ABG    Component Value Date/Time   PHART 7.529 (H) 01/05/2019 0309   PCO2ART 28.5 (L) 01/05/2019 0309   PO2ART 71.0 (L) 01/05/2019 0309   HCO3 23.8 01/05/2019 0309   TCO2 25 01/05/2019 0309   ACIDBASEDEF 0.9 07/09/2011 1100   O2SAT 96.0 01/05/2019 0309     Coagulation Profile: Recent Labs  Lab 02/15/19 2128  INR 1.0    Cardiac Enzymes: No results for input(s): CKTOTAL, CKMB, CKMBINDEX, TROPONINI in the last 168  hours.  HbA1C: No results found for: HGBA1C  CBG: No results for input(s): GLUCAP in the last 168 hours.    Julian Hy, DO 02/17/19 5:27 PM [facility]

## 2019-02-17 NOTE — TOC Transition Note (Signed)
Transition of Care [facility]) - CM/SW Discharge Note   Patient Details  Name: Luis Townsend MRN: [id number] Date of Birth: 1928/10/11  Transition of Care [facility]) CM/SW Contact:  Dessa Phi, RN Phone Number: 02/17/2019, 3:46 PM   Clinical Narrative:  [facility] [facility] aware of home 02;rw-deliver to rm prior d/c.d/c today. [place] set up.      Final next level of care: [place] Barriers to Discharge: No Barriers Identified   Patient Goals and CMS Choice Patient states their goals for this hospitalization and ongoing recovery are:: go home CMS Medicare.gov Compare Post Acute Care list provided to:: Patient Represenative (must comment) Choice offered to / list presented to : Adult Children  Discharge Placement                       Discharge Plan and Services                DME Arranged: Oxygen DME Agency: [employer] Date DME Agency Contacted: 02/17/19 Time DME Agency Contacted: 1229 Representative spoke with at DME Agency: [place]: PT [place]: [place] Date [place]: 02/17/19 Time [place]: 1229 Representative spoke with at [place]: cory  Social Determinants of Health ([place]) Interventions     Readmission Risk Interventions No flowsheet data found.

## 2019-02-17 NOTE — Progress Notes (Signed)
  Echocardiogram 2D Echocardiogram has been performed.  Luis Townsend 02/17/2019, 9:35 AM

## 2019-02-17 NOTE — Progress Notes (Signed)
SATURATION QUALIFICATIONS: (This note is used to comply with regulatory documentation for home oxygen)  Patient Saturations on Room Air at Rest = 88%  Patient Saturations on Room Air while Ambulating =85 %  Patient Saturations on 2 Liters of oxygen while Ambulating = 95%  Please briefly explain why patient needs home oxygen:Pt desaturates without at least 2L of O2

## 2019-02-18 ENCOUNTER — Telehealth: Payer: Medicare Other | Admitting: Adult Health

## 2019-02-18 ENCOUNTER — Telehealth: Payer: Self-pay

## 2019-02-18 ENCOUNTER — Encounter: Payer: Self-pay | Admitting: Adult Health

## 2019-02-18 ENCOUNTER — Telehealth: Payer: Self-pay | Admitting: Critical Care Medicine

## 2019-02-18 LAB — CYTOLOGY - NON PAP

## 2019-02-18 NOTE — Telephone Encounter (Signed)
Transition Care Management Follow-up Telephone Call  Date of discharge and from where: 02/17/2019 from [place]  How have you been since you were released from the hospital? "He's not doing very well, hasn't been out of the bed very long at all today".   Any questions or concerns? No   Items Reviewed:  Did the pt receive and understand the discharge instructions provided? Yes   Medications obtained and verified? Yes   Any new allergies since your discharge? No   Dietary orders reviewed? Yes  Do you have support at home? Yes ; wife   Other (ie: DME, Home Health, etc) yes; oxygen was delivered and PT has contacted for admission. Has walker as well.  Functional Questionnaire: (I = Independent and D = Dependent) ADL's: requires assistance with all ADL's at this time.   Bathing/Dressing-    Meal Prep-   Eating-   Maintaining continence-   Transferring/Ambulation-   Managing Meds-    Follow up appointments reviewed:    PCP Hospital f/u appt confirmed? Yes  Scheduled to see [facility] via telephone visit on 02/24/19 2:00pm.  [facility] f/u appt confirmed? Yes  Having a PET scan 1/11 and has other appointments scheduled as well. Wife reported receiving a call this afternoon that fluid removed by thoracentesis revealed cancer cells.  Are transportation arrangements needed? No   If their condition worsens, is the pt aware to call  their PCP or go to the ED? Yes  Was the patient provided with contact information for the PCP's office or ED? Yes  Was the pt encouraged to call back with questions or concerns? Yes

## 2019-02-18 NOTE — Telephone Encounter (Signed)
I called Mr. 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 I will let Dr. Melvyn Novas know. They are currently planning on following through with the PET scan at this point and will follow up with Dr. Melvyn Novas regarding next steps. He should not need additional diagnostic procedures.  Julian Hy, DO 02/18/19 1:47 PM [facility]

## 2019-02-19 ENCOUNTER — Telehealth: Payer: Self-pay | Admitting: Family Medicine

## 2019-02-19 DIAGNOSIS — I252 Old myocardial infarction: Secondary | ICD-10-CM | POA: Diagnosis not present

## 2019-02-19 DIAGNOSIS — I0981 Rheumatic heart failure: Secondary | ICD-10-CM | POA: Diagnosis not present

## 2019-02-19 DIAGNOSIS — I739 Peripheral vascular disease, unspecified: Secondary | ICD-10-CM | POA: Diagnosis not present

## 2019-02-19 DIAGNOSIS — J181 Lobar pneumonia, unspecified organism: Secondary | ICD-10-CM | POA: Diagnosis not present

## 2019-02-19 DIAGNOSIS — I2510 Atherosclerotic heart disease of native coronary artery without angina pectoris: Secondary | ICD-10-CM | POA: Diagnosis not present

## 2019-02-19 DIAGNOSIS — M4854XD Collapsed vertebra, not elsewhere classified, thoracic region, subsequent encounter for fracture with routine healing: Secondary | ICD-10-CM | POA: Diagnosis not present

## 2019-02-19 DIAGNOSIS — I4510 Unspecified right bundle-branch block: Secondary | ICD-10-CM | POA: Diagnosis not present

## 2019-02-19 DIAGNOSIS — M47814 Spondylosis without myelopathy or radiculopathy, thoracic region: Secondary | ICD-10-CM | POA: Diagnosis not present

## 2019-02-19 DIAGNOSIS — J439 Emphysema, unspecified: Secondary | ICD-10-CM | POA: Diagnosis not present

## 2019-02-19 DIAGNOSIS — Z792 Long term (current) use of antibiotics: Secondary | ICD-10-CM | POA: Diagnosis not present

## 2019-02-19 DIAGNOSIS — K5730 Diverticulosis of large intestine without perforation or abscess without bleeding: Secondary | ICD-10-CM | POA: Diagnosis not present

## 2019-02-19 DIAGNOSIS — I700 Atherosclerosis of aorta: Secondary | ICD-10-CM | POA: Diagnosis not present

## 2019-02-19 DIAGNOSIS — N400 Enlarged prostate without lower urinary tract symptoms: Secondary | ICD-10-CM | POA: Diagnosis not present

## 2019-02-19 DIAGNOSIS — I110 Hypertensive heart disease with heart failure: Secondary | ICD-10-CM | POA: Diagnosis not present

## 2019-02-19 DIAGNOSIS — E785 Hyperlipidemia, unspecified: Secondary | ICD-10-CM | POA: Diagnosis not present

## 2019-02-19 DIAGNOSIS — J910 Malignant pleural effusion: Secondary | ICD-10-CM | POA: Diagnosis not present

## 2019-02-19 DIAGNOSIS — C3432 Malignant neoplasm of lower lobe, left bronchus or lung: Secondary | ICD-10-CM | POA: Diagnosis not present

## 2019-02-19 DIAGNOSIS — Z7951 Long term (current) use of inhaled steroids: Secondary | ICD-10-CM | POA: Diagnosis not present

## 2019-02-19 DIAGNOSIS — Z7982 Long term (current) use of aspirin: Secondary | ICD-10-CM | POA: Diagnosis not present

## 2019-02-19 DIAGNOSIS — I081 Rheumatic disorders of both mitral and tricuspid valves: Secondary | ICD-10-CM | POA: Diagnosis not present

## 2019-02-19 DIAGNOSIS — J9601 Acute respiratory failure with hypoxia: Secondary | ICD-10-CM | POA: Diagnosis not present

## 2019-02-19 DIAGNOSIS — I442 Atrioventricular block, complete: Secondary | ICD-10-CM | POA: Diagnosis not present

## 2019-02-19 DIAGNOSIS — D630 Anemia in neoplastic disease: Secondary | ICD-10-CM | POA: Diagnosis not present

## 2019-02-19 DIAGNOSIS — Z9981 Dependence on supplemental oxygen: Secondary | ICD-10-CM | POA: Diagnosis not present

## 2019-02-19 DIAGNOSIS — I5030 Unspecified diastolic (congestive) heart failure: Secondary | ICD-10-CM | POA: Diagnosis not present

## 2019-02-19 NOTE — Telephone Encounter (Signed)
I received a call from Home home health that their PT was concerned and the patient could not get out of bed.  Their RN called me, and the family wanted to get a Hospice evaluation this weekend.   I reviewed the chart, and the daughter sent a mychart message about of life planning in a setting of stage 4 adenocarcinoma.  I think that this is an appropriate consultation.  Hospice will evaluate and follow-up with PCP regarding orders and recommendations.  I will send a copy of this note to Dr. Melvyn Novas and [name].

## 2019-02-20 ENCOUNTER — Telehealth: Payer: Self-pay | Admitting: Critical Care Medicine

## 2019-02-20 DIAGNOSIS — J159 Unspecified bacterial pneumonia: Secondary | ICD-10-CM | POA: Diagnosis not present

## 2019-02-20 DIAGNOSIS — Z741 Need for assistance with personal care: Secondary | ICD-10-CM | POA: Diagnosis not present

## 2019-02-20 DIAGNOSIS — R32 Unspecified urinary incontinence: Secondary | ICD-10-CM | POA: Diagnosis not present

## 2019-02-20 DIAGNOSIS — J449 Chronic obstructive pulmonary disease, unspecified: Secondary | ICD-10-CM | POA: Diagnosis not present

## 2019-02-20 DIAGNOSIS — I25709 Atherosclerosis of coronary artery bypass graft(s), unspecified, with unspecified angina pectoris: Secondary | ICD-10-CM | POA: Diagnosis not present

## 2019-02-20 DIAGNOSIS — Z87891 Personal history of nicotine dependence: Secondary | ICD-10-CM | POA: Diagnosis not present

## 2019-02-20 DIAGNOSIS — J910 Malignant pleural effusion: Secondary | ICD-10-CM | POA: Diagnosis not present

## 2019-02-20 DIAGNOSIS — Z954 Presence of other heart-valve replacement: Secondary | ICD-10-CM | POA: Diagnosis not present

## 2019-02-20 DIAGNOSIS — M6281 Muscle weakness (generalized): Secondary | ICD-10-CM | POA: Diagnosis not present

## 2019-02-20 DIAGNOSIS — C3492 Malignant neoplasm of unspecified part of left bronchus or lung: Secondary | ICD-10-CM | POA: Diagnosis not present

## 2019-02-20 DIAGNOSIS — I442 Atrioventricular block, complete: Secondary | ICD-10-CM | POA: Diagnosis not present

## 2019-02-20 DIAGNOSIS — Z950 Presence of cardiac pacemaker: Secondary | ICD-10-CM | POA: Diagnosis not present

## 2019-02-20 DIAGNOSIS — E46 Unspecified protein-calorie malnutrition: Secondary | ICD-10-CM | POA: Diagnosis not present

## 2019-02-20 DIAGNOSIS — Z9981 Dependence on supplemental oxygen: Secondary | ICD-10-CM | POA: Diagnosis not present

## 2019-02-20 DIAGNOSIS — C7801 Secondary malignant neoplasm of right lung: Secondary | ICD-10-CM | POA: Diagnosis not present

## 2019-02-20 DIAGNOSIS — Z6823 Body mass index (BMI) 23.0-23.9, adult: Secondary | ICD-10-CM | POA: Diagnosis not present

## 2019-02-20 DIAGNOSIS — C781 Secondary malignant neoplasm of mediastinum: Secondary | ICD-10-CM | POA: Diagnosis not present

## 2019-02-20 LAB — BODY FLUID CULTURE: Culture: NO GROWTH

## 2019-02-20 NOTE — Telephone Encounter (Signed)
I spoke to the Market researcher of the [facility]. They are sending a nurse to his house now to care for him as he was started as an emergent case.   I called [facility] to attempt to verify that they had the resources that they needed. I left a VM to let her know to please call back if needed.  Julian Hy, DO 02/20/19 4:53 PM [facility]

## 2019-02-20 NOTE — Telephone Encounter (Signed)
[facility] in [place] does not have Mr. Bringhurst listed in their registry. [facility] has not yet heard back from the [facility] agency, but has left a message and is waiting for a call back. If she can find out which organization the referral was sent through and is not able to find out when they will be able to see her father, she will call back to let me know.  I encouraged her that if her father is too uncomfortable, especially if we are unable to get in touch with hospice, they should still consider bringing him to the ED so we can help control his symptoms, but I understand her reluctance.  Julian Hy, DO 02/20/19 1:56 PM [facility]

## 2019-02-20 NOTE — Telephone Encounter (Signed)
[place] has declined over the weekend. He has control of his left arm, so he can't use a walker, he will not eat regularly, not taking meds. He is still drinking. Laying in bed, not getting up.   Physical therapy yesterday ordered a WC. Now in the [facility] saying he can't breathe. Yesterday he tolerated it for 20 minutes. On Thursday was able to walk around the house some still.  Referred to hospice yesterday by PCP. [facility]  They do not think he could tolerate the PET scan tomorrow, and I agree. They do not want to bring him to the hospital because they fear that he will pass away here.   The family wants to know if it is possible to have home hospice set up soon. I will attempt to call [facility] and they will re-contact their [facility] agency.  Julian Hy, DO 02/20/19 1:40 PM [facility]

## 2019-02-21 ENCOUNTER — Other Ambulatory Visit: Payer: Self-pay | Admitting: Internal Medicine

## 2019-02-21 ENCOUNTER — Telehealth: Payer: Self-pay | Admitting: *Deleted

## 2019-02-21 ENCOUNTER — Encounter (HOSPITAL_COMMUNITY): Payer: Medicare Other

## 2019-02-21 DIAGNOSIS — J449 Chronic obstructive pulmonary disease, unspecified: Secondary | ICD-10-CM | POA: Diagnosis not present

## 2019-02-21 DIAGNOSIS — J910 Malignant pleural effusion: Secondary | ICD-10-CM | POA: Diagnosis not present

## 2019-02-21 DIAGNOSIS — C781 Secondary malignant neoplasm of mediastinum: Secondary | ICD-10-CM | POA: Diagnosis not present

## 2019-02-21 DIAGNOSIS — C3492 Malignant neoplasm of unspecified part of left bronchus or lung: Secondary | ICD-10-CM | POA: Diagnosis not present

## 2019-02-21 DIAGNOSIS — I25709 Atherosclerosis of coronary artery bypass graft(s), unspecified, with unspecified angina pectoris: Secondary | ICD-10-CM | POA: Diagnosis not present

## 2019-02-21 DIAGNOSIS — C7801 Secondary malignant neoplasm of right lung: Secondary | ICD-10-CM | POA: Diagnosis not present

## 2019-02-21 NOTE — Telephone Encounter (Signed)
Arnold from [facility] called the after hours line and reports pt came home w/ pt and nursing order. He is having hard time breathing, no function in left ar. Him and family refuse to go to ER. He fell this morning, but no injury. They also need a script for bedside commode and wheelchair

## 2019-02-21 NOTE — Telephone Encounter (Signed)
Patient also needs to have a hospice eval and needs an order. Please call [facility] at [phone]

## 2019-02-21 NOTE — Progress Notes (Signed)
Please cancel PET request

## 2019-02-21 NOTE — Telephone Encounter (Signed)
FYI  Pt is scheduled to see you 02/24/2019. Should we find a sooner appt. Time?

## 2019-02-21 NOTE — Telephone Encounter (Signed)
Estimated remaining longevity until ERI estimated at 1 month as of 01/18/19. Next monthly battery check transmission scheduled for 02/23/19.

## 2019-02-22 DIAGNOSIS — C781 Secondary malignant neoplasm of mediastinum: Secondary | ICD-10-CM | POA: Diagnosis not present

## 2019-02-22 DIAGNOSIS — I25709 Atherosclerosis of coronary artery bypass graft(s), unspecified, with unspecified angina pectoris: Secondary | ICD-10-CM | POA: Diagnosis not present

## 2019-02-22 DIAGNOSIS — J449 Chronic obstructive pulmonary disease, unspecified: Secondary | ICD-10-CM | POA: Diagnosis not present

## 2019-02-22 DIAGNOSIS — C3492 Malignant neoplasm of unspecified part of left bronchus or lung: Secondary | ICD-10-CM | POA: Diagnosis not present

## 2019-02-22 DIAGNOSIS — J910 Malignant pleural effusion: Secondary | ICD-10-CM | POA: Diagnosis not present

## 2019-02-22 DIAGNOSIS — C7801 Secondary malignant neoplasm of right lung: Secondary | ICD-10-CM | POA: Diagnosis not present

## 2019-02-22 NOTE — Telephone Encounter (Signed)
This has been taking care of by Dr.Cory Copeland.

## 2019-02-22 NOTE — Telephone Encounter (Signed)
We can go ahead and set him up with Hospice today  Thanks  [facility]

## 2019-02-23 ENCOUNTER — Ambulatory Visit (INDEPENDENT_AMBULATORY_CARE_PROVIDER_SITE_OTHER): Payer: Medicare Other | Admitting: *Deleted

## 2019-02-23 ENCOUNTER — Telehealth: Payer: Self-pay | Admitting: Emergency Medicine

## 2019-02-23 DIAGNOSIS — J910 Malignant pleural effusion: Secondary | ICD-10-CM | POA: Diagnosis not present

## 2019-02-23 DIAGNOSIS — I25709 Atherosclerosis of coronary artery bypass graft(s), unspecified, with unspecified angina pectoris: Secondary | ICD-10-CM | POA: Diagnosis not present

## 2019-02-23 DIAGNOSIS — C7801 Secondary malignant neoplasm of right lung: Secondary | ICD-10-CM | POA: Diagnosis not present

## 2019-02-23 DIAGNOSIS — R001 Bradycardia, unspecified: Secondary | ICD-10-CM

## 2019-02-23 DIAGNOSIS — J449 Chronic obstructive pulmonary disease, unspecified: Secondary | ICD-10-CM | POA: Diagnosis not present

## 2019-02-23 DIAGNOSIS — C3492 Malignant neoplasm of unspecified part of left bronchus or lung: Secondary | ICD-10-CM | POA: Diagnosis not present

## 2019-02-23 DIAGNOSIS — C781 Secondary malignant neoplasm of mediastinum: Secondary | ICD-10-CM | POA: Diagnosis not present

## 2019-02-23 NOTE — Telephone Encounter (Signed)
Daughter calling and wife  Luis Townsend to trouble shoot remote monitor to send transmission. Patient near EOS and in care of hospice. Hand set needs to charhe. Will call patient back around 1315 to assist with transmission.

## 2019-02-23 NOTE — Telephone Encounter (Signed)
LMOM to call for assistance with sending transmission.

## 2019-02-23 NOTE — Telephone Encounter (Signed)
Called and spoke to dtr, wife has stepped out.  She reports pt is nearing end of life, hospice involved.  Stage IV lung cancer w/ metastasis. Discussed pt's dependency on PPM and what will happen when battery dies.  She voices that the family understands this, they have been educated. They are not interested in battery change d/t diagnosis/prognosis. They are agreeable to informing the office once pt has passed. She appreciates my call

## 2019-02-23 NOTE — Telephone Encounter (Addendum)
Spoke with daughter to clarify her questions. Informed her that patient's battery life may be at [facility] now which means he would have approximately 3 months of battery life left on device. She reported that the patient is reaching end of life due to his pulmonary issues and that Hospice was assisting with his care. She reported that he had been give less than 3 months to live according to his PCP and Hospice.   Will forward to Dr Curt Bears to determine if continual remote monitoring is recommended after this transmission is received.

## 2019-02-23 NOTE — Telephone Encounter (Signed)
I tried to help the pt send a manual transmission but she still received the error code [id number]. [employer] tried to trouble shoot the monitor but will call the pt in one hour. I told her once we get the transmission the nurse will give her a call back. I told her the office close at 5 pm but we will call her in the morning. I also let her talk with device nurse Jenny Reichmann, Rn.

## 2019-02-24 ENCOUNTER — Other Ambulatory Visit: Payer: Self-pay | Admitting: Adult Health

## 2019-02-24 ENCOUNTER — Inpatient Hospital Stay: Payer: Self-pay | Admitting: Adult Health

## 2019-02-24 DIAGNOSIS — C781 Secondary malignant neoplasm of mediastinum: Secondary | ICD-10-CM | POA: Diagnosis not present

## 2019-02-24 DIAGNOSIS — C7801 Secondary malignant neoplasm of right lung: Secondary | ICD-10-CM | POA: Diagnosis not present

## 2019-02-24 DIAGNOSIS — I25709 Atherosclerosis of coronary artery bypass graft(s), unspecified, with unspecified angina pectoris: Secondary | ICD-10-CM | POA: Diagnosis not present

## 2019-02-24 DIAGNOSIS — C3492 Malignant neoplasm of unspecified part of left bronchus or lung: Secondary | ICD-10-CM | POA: Diagnosis not present

## 2019-02-24 DIAGNOSIS — J449 Chronic obstructive pulmonary disease, unspecified: Secondary | ICD-10-CM | POA: Diagnosis not present

## 2019-02-24 DIAGNOSIS — J910 Malignant pleural effusion: Secondary | ICD-10-CM | POA: Diagnosis not present

## 2019-02-24 LAB — CUP PACEART REMOTE DEVICE CHECK
Battery Remaining Longevity: 1 mo — CL
Battery Voltage: 2.6 V
Brady Statistic AP VP Percent: 86.47 %
Brady Statistic AP VS Percent: 0.04 %
Brady Statistic AS VP Percent: 13.22 %
Brady Statistic AS VS Percent: 0.27 %
Brady Statistic RA Percent Paced: 86.4 %
Brady Statistic RV Percent Paced: 99.69 %
Date Time Interrogation Session: 20210113233218
Implantable Lead Implant Date: 20131002
Implantable Lead Implant Date: 20131002
Implantable Lead Implant Date: 20171019
Implantable Lead Location: 753858
Implantable Lead Location: 753859
Implantable Lead Location: 753860
Implantable Lead Model: 4298
Implantable Lead Model: 5076
Implantable Lead Model: 5076
Implantable Pulse Generator Implant Date: 20171019
Lead Channel Impedance Value: 266 Ohm
Lead Channel Impedance Value: 342 Ohm
Lead Channel Impedance Value: 399 Ohm
Lead Channel Impedance Value: 418 Ohm
Lead Channel Impedance Value: 437 Ohm
Lead Channel Impedance Value: 456 Ohm
Lead Channel Impedance Value: 513 Ohm
Lead Channel Impedance Value: 532 Ohm
Lead Channel Impedance Value: 532 Ohm
Lead Channel Impedance Value: 570 Ohm
Lead Channel Impedance Value: 627 Ohm
Lead Channel Impedance Value: 703 Ohm
Lead Channel Impedance Value: 836 Ohm
Lead Channel Impedance Value: 893 Ohm
Lead Channel Pacing Threshold Amplitude: 0.75 V
Lead Channel Pacing Threshold Amplitude: 4.5 V
Lead Channel Pacing Threshold Pulse Width: 0.4 ms
Lead Channel Pacing Threshold Pulse Width: 1 ms
Lead Channel Sensing Intrinsic Amplitude: 1.5 mV
Lead Channel Sensing Intrinsic Amplitude: 1.5 mV
Lead Channel Setting Pacing Amplitude: 2 V
Lead Channel Setting Pacing Amplitude: 2.5 V
Lead Channel Setting Pacing Amplitude: 6 V
Lead Channel Setting Pacing Pulse Width: 0.4 ms
Lead Channel Setting Pacing Pulse Width: 1 ms
Lead Channel Setting Sensing Sensitivity: 4 mV

## 2019-02-24 NOTE — Telephone Encounter (Signed)
Transmission received 02/24/19. PPM battery is estimating <1 month until ERI.  Spoke with [employer]. She is aware of battery status. Advised next transmission is scheduled for 03/28/19. Lauree Chandler reports she does not think patient will survive another month. Forwarded to Dr. Curt Bears and Venida Jarvis, RN, as Juluis Rainier.

## 2019-02-25 ENCOUNTER — Other Ambulatory Visit: Payer: Self-pay | Admitting: *Deleted

## 2019-02-25 DIAGNOSIS — C781 Secondary malignant neoplasm of mediastinum: Secondary | ICD-10-CM | POA: Diagnosis not present

## 2019-02-25 DIAGNOSIS — J449 Chronic obstructive pulmonary disease, unspecified: Secondary | ICD-10-CM | POA: Diagnosis not present

## 2019-02-25 DIAGNOSIS — C7801 Secondary malignant neoplasm of right lung: Secondary | ICD-10-CM | POA: Diagnosis not present

## 2019-02-25 DIAGNOSIS — J910 Malignant pleural effusion: Secondary | ICD-10-CM | POA: Diagnosis not present

## 2019-02-25 DIAGNOSIS — I25709 Atherosclerosis of coronary artery bypass graft(s), unspecified, with unspecified angina pectoris: Secondary | ICD-10-CM | POA: Diagnosis not present

## 2019-02-25 DIAGNOSIS — C3492 Malignant neoplasm of unspecified part of left bronchus or lung: Secondary | ICD-10-CM | POA: Diagnosis not present

## 2019-03-14 NOTE — Patient Outreach (Signed)
[facility]) Care Management  March 13, 2019  Luis Townsend 26-Oct-1928 [id number]    EMMI-GENERAL DISCHARGE-DECEASED  RN informed by family member that pt expired this morning.   Case closed with no further actions at this time.  Raina Mina, RN Care Management Coordinator [place] Office [phone]

## 2019-03-14 DEATH — deceased

## 2021-08-31 IMAGING — CT CT CHEST W/ CM
2 of 4 series · 14 of 36 positions shown, 17 images · IV contrast (omnipaque)
Comparison: Radiographs 01/05/2019, 04/30/2017, CTA chest, abdomen
and pelvis 07/09/2011

CLINICAL DATA: Shortness of breath

EXAM:
CT CHEST WITH CONTRAST
TECHNIQUE: Multidetector CT imaging of the chest was performed during
intravenous contrast administration.
CONTRAST:  75mL OMNIPAQUE IOHEXOL 300 MG/ML  SOLN

[Series 3: chest w · axial · 0.82mm/px · z∈[+1067,+1379]mm · 11 of 186 slices shown, 14 images]
[im 15/186  mediastinal]
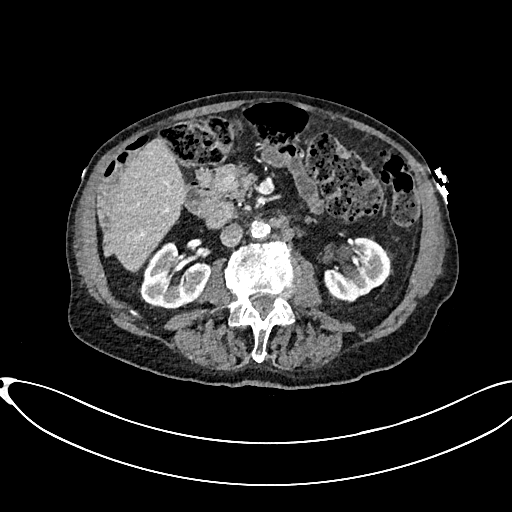
[im 15/186  lung]
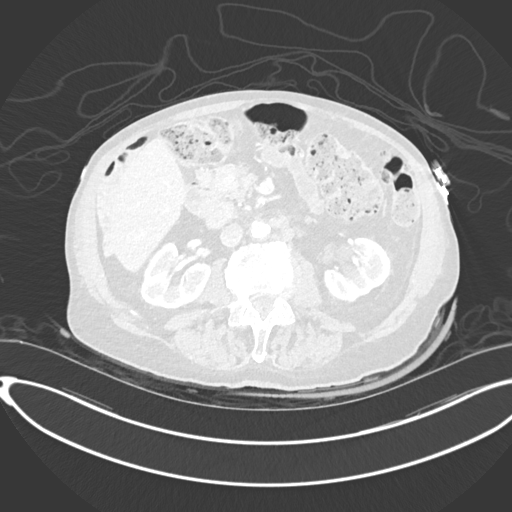
[im 29/186  lung]
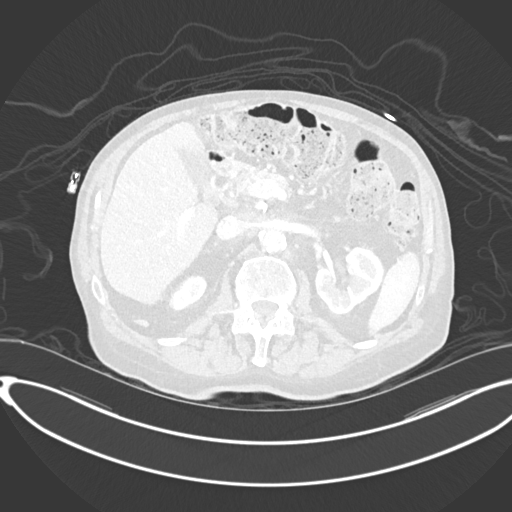
[im 43/186  lung]
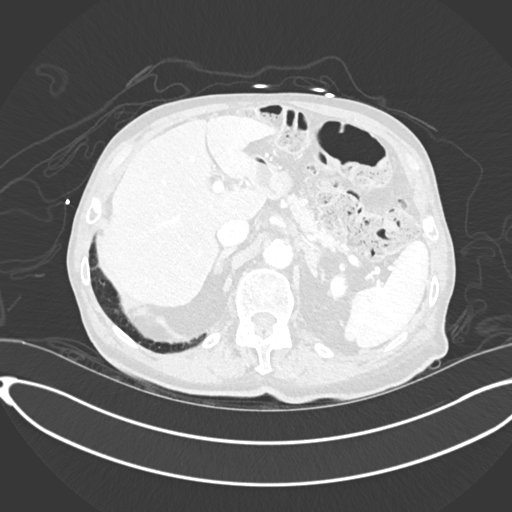
[im 57/186  lung]
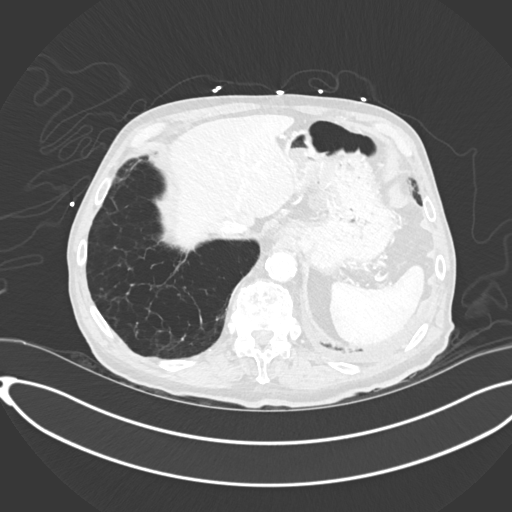
[im 72/186  mediastinal]
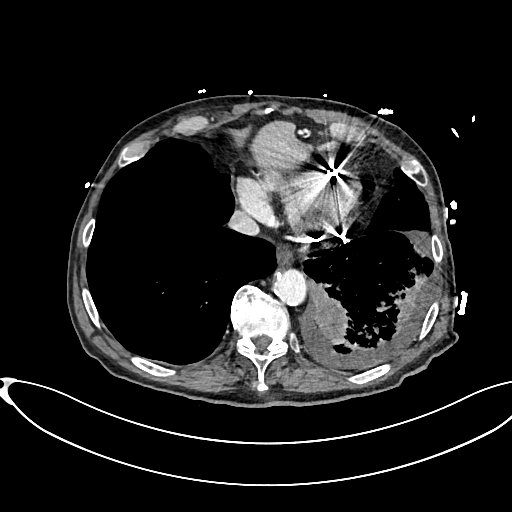
[im 72/186  lung]
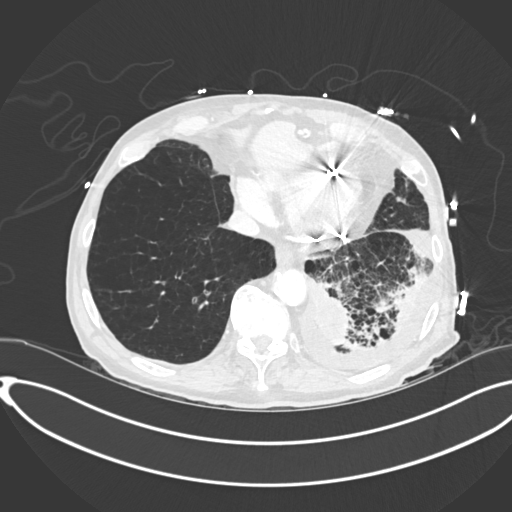
[im 100/186  lung]
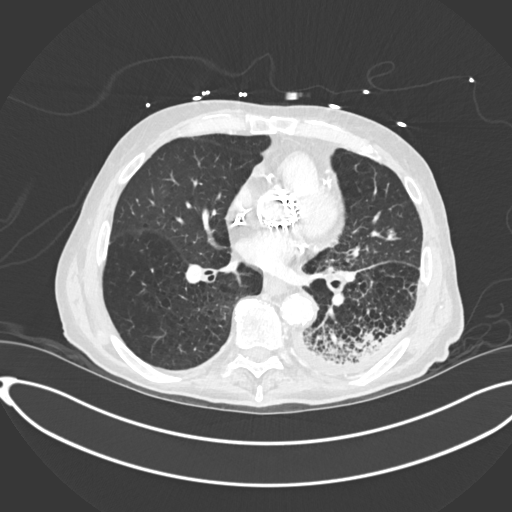
[im 114/186  lung]
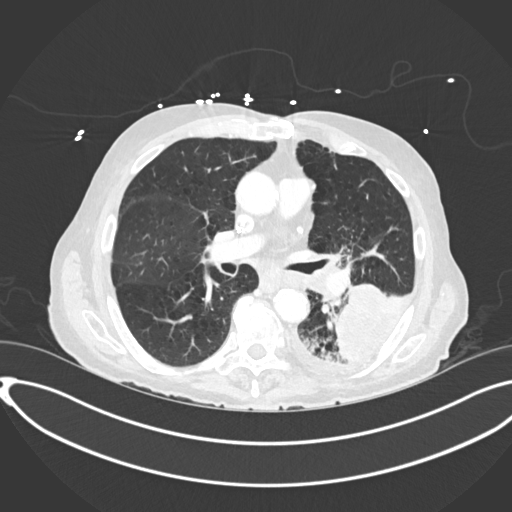
[im 129/186  lung]
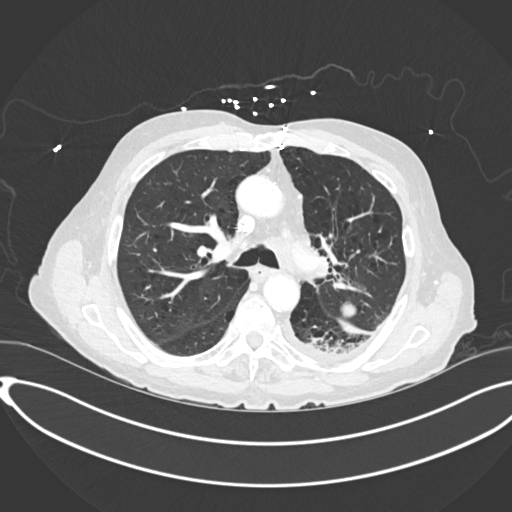
[im 143/186  mediastinal]
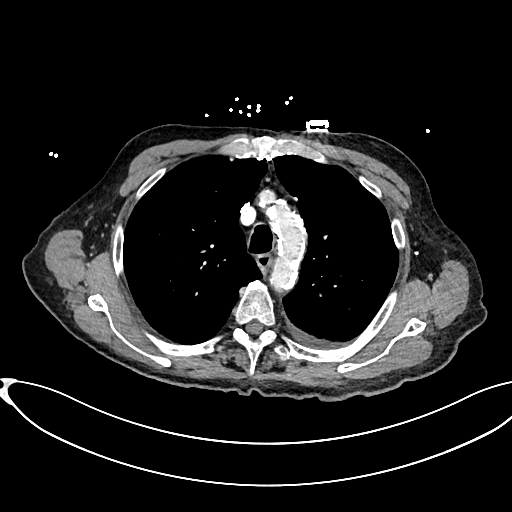
[im 143/186  lung]
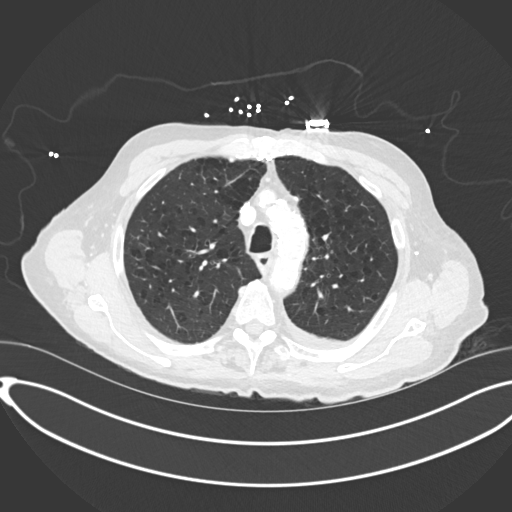
[im 157/186  lung]
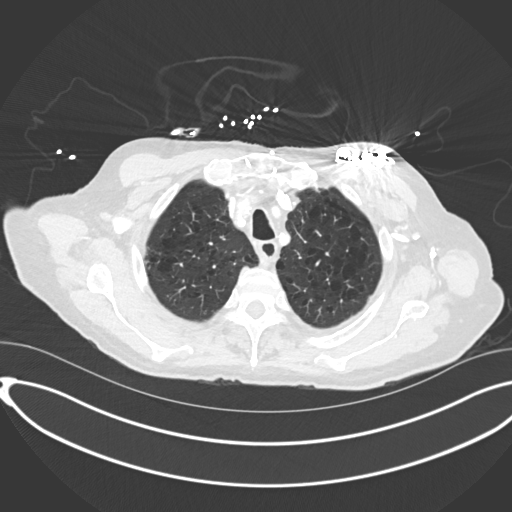
[im 171/186  lung]
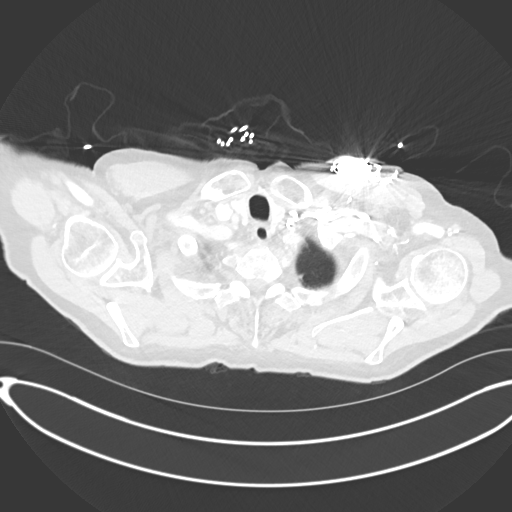

[Series 6: cor · coronal · 0.74mm/px · 3 of 134 slices shown]
[im 27/134  lung]
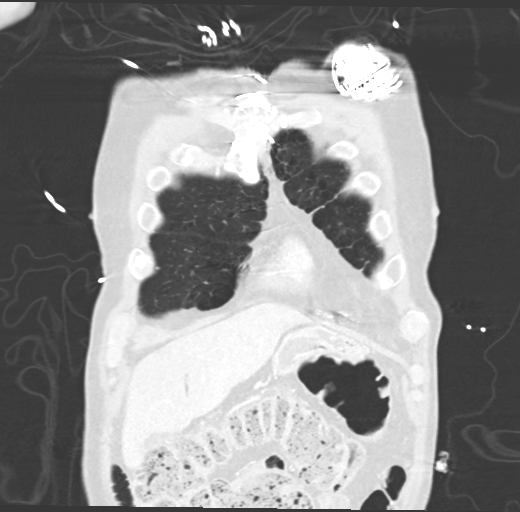
[im 54/134  lung]
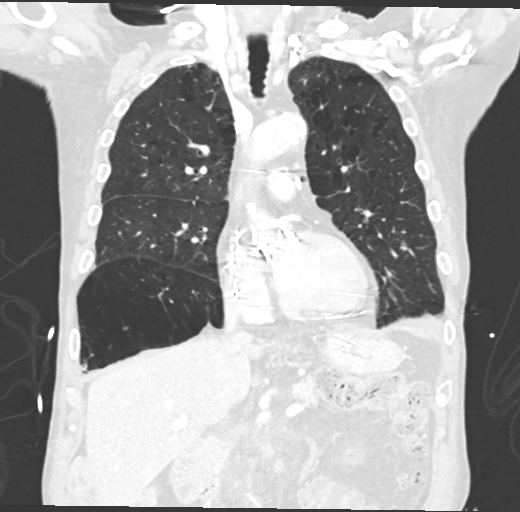
[im 80/134  lung]
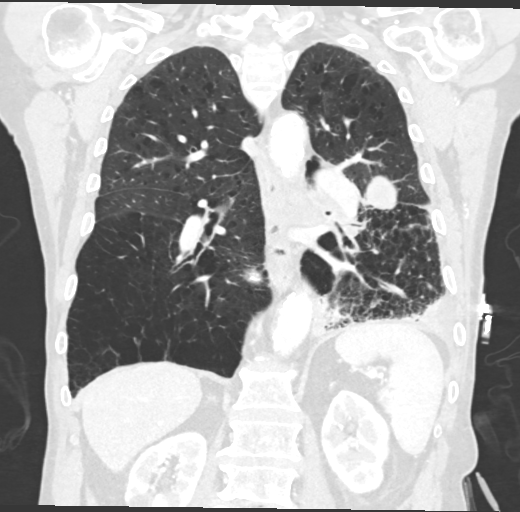

[14 of 36 positions shown; findings below may reference images not displayed]

FINDINGS: Cardiovascular: Cardiac pacer pack is within the chest wall soft
tissues. Leads are seen in the right atrium, cardiac apex and
coronary sinus. Cardiac size at the upper limits of normal. There is
prior aortic valve replacement. Extensive coronary artery
calcifications are noted. Post CABG changes are present as well with
sternotomy sutures in place. Central pulmonary arteries are normal
caliber. No large central filling defects on this non tailored
examination. The thoracic aorta is heavily calcified but normal
caliber. Normal 3 vessel branching of the arch. There is vascular
stenting of the left subclavian artery proximal to the vertebral
artery origin.

Mediastinum/Nodes: There are several enlarged lymph nodes in the
mediastinum and hila, largest is a 16 mm subcarinal lymph node
(3/74), a paratracheal lymph node measures 11 mm (3/54). Left hilar
node measures 12 mm in size (3/74), additional left hilar lymph node
measures 12 mm in size (3/66). No contralateral right hilar
adenopathy. No acute abnormality of the trachea or esophagus.
Coronal narrowing of the trachea typically pathognomonic for COPD.

Lungs/Pleura: Centrilobular and paraseptal emphysema. Large masslike
consolidation in the left lower lobe measuring up to 6.5 x 3.8 cm in
maximum transaxial dimension. There is internal heterogeneity in
hypoattenuation. A separate masslike consolidation is noted more
inferiorly and medially in the left lung base measuring 4.9 x
cm. Additional interspersed areas of airspace disease are present.
Associated airways thickening is noted. Small left effusion is seen.
Question some mild pleural thickening. Fluid is seen tracking along
the left fissure.

Upper Abdomen: No acute abnormalities present in the visualized
portions of the upper abdomen. Scattered colonic diverticula without
focal pericolonic inflammation to suggest diverticulitis. Nodular
thickening of the adrenal glands. Fluid attenuation cysts in the
right and left kidney, incompletely visualized on this exam.

Musculoskeletal: Multilevel degenerative changes are present in the
imaged portions of the spine. Additional degenerative changes in the
shoulders. Sclerotic focus in the left humeral head neck junction is
similar to comparison radiographs. The osseous structures appear
diffusely demineralized which may limit detection of small or
nondisplaced fractures. Remote appearing compression deformities T12
and T7 present on comparison radiography from 04/30/2017. no acute
fracture. Prior sternotomy changes.
IMPRESSION: 1. Large masslike consolidation in the left lower lobe measuring up
to 6.5 x 3.8 cm, with additional consolidation more inferiorly and
medially in the left lung base measuring 4.9 x 3.1 cm, findings
could reflect either a necrotic pneumonia or underlying malignancy.
Airways thickening and a left pleural effusion with some pleural
thickening which may reflect an empyema.
2. Multiple enlarged mediastinal and hilar lymph nodes, possibly
reactive or metastatic.
3. Extensive coronary artery atherosclerosis and postsurgical
changes from CABG.
4. Extensive atherosclerotic calcification of the aorta and branch
vessels with stenting of the proximal left subclavian artery.
5. Remote appearing compression deformities T12 and T7 present on
comparison radiography from 04/30/2017.
6. Aortic Atherosclerosis ([id number]-GZE.E)
7. Emphysema ([id number].B).
